# Patient Record
Sex: Male | Born: 1950 | Race: White | Hispanic: No | Marital: Married | Smoking: Current every day smoker
Health system: Southern US, Community
[De-identification: ages and names within clinical notes are randomized; demographics above are authoritative.]

## PROBLEM LIST (undated history)

## (undated) DIAGNOSIS — Z87448 Personal history of other diseases of urinary system: Secondary | ICD-10-CM

## (undated) DIAGNOSIS — I341 Nonrheumatic mitral (valve) prolapse: Secondary | ICD-10-CM

## (undated) DIAGNOSIS — N400 Enlarged prostate without lower urinary tract symptoms: Secondary | ICD-10-CM

## (undated) DIAGNOSIS — N189 Chronic kidney disease, unspecified: Secondary | ICD-10-CM

## (undated) DIAGNOSIS — E785 Hyperlipidemia, unspecified: Secondary | ICD-10-CM

## (undated) DIAGNOSIS — C61 Malignant neoplasm of prostate: Secondary | ICD-10-CM

## (undated) DIAGNOSIS — D649 Anemia, unspecified: Secondary | ICD-10-CM

## (undated) HISTORY — DX: Anemia, unspecified: D64.9

## (undated) HISTORY — DX: Benign prostatic hyperplasia without lower urinary tract symptoms: N40.0

## (undated) HISTORY — PX: TRANSURETHRAL RESECTION OF PROSTATE: SHX73

## (undated) HISTORY — PX: VASECTOMY: SHX75

## (undated) HISTORY — DX: Chronic kidney disease, unspecified: N18.9

## (undated) HISTORY — PX: TONSILLECTOMY: SUR1361

## (undated) HISTORY — DX: Nonrheumatic mitral (valve) prolapse: I34.1

## (undated) HISTORY — DX: Hyperlipidemia, unspecified: E78.5

## (undated) HISTORY — DX: Personal history of other diseases of urinary system: Z87.448

## (undated) HISTORY — PX: INGUINAL HERNIA REPAIR: SUR1180

---

## 2004-10-29 HISTORY — PX: NEPHROSTOMY: SHX1014

## 2005-05-29 ENCOUNTER — Ambulatory Visit (HOSPITAL_COMMUNITY): Admission: RE | Admit: 2005-05-29 | Discharge: 2005-05-29 | Payer: Self-pay | Admitting: Family Medicine

## 2005-06-04 ENCOUNTER — Inpatient Hospital Stay (HOSPITAL_COMMUNITY): Admission: AD | Admit: 2005-06-04 | Discharge: 2005-06-08 | Payer: Self-pay | Admitting: Urology

## 2005-06-18 ENCOUNTER — Ambulatory Visit (HOSPITAL_COMMUNITY): Admission: RE | Admit: 2005-06-18 | Discharge: 2005-06-18 | Payer: Self-pay | Admitting: Urology

## 2005-07-03 ENCOUNTER — Inpatient Hospital Stay (HOSPITAL_COMMUNITY): Admission: RE | Admit: 2005-07-03 | Discharge: 2005-07-08 | Payer: Self-pay | Admitting: Urology

## 2005-07-03 ENCOUNTER — Ambulatory Visit (HOSPITAL_COMMUNITY): Admission: RE | Admit: 2005-07-03 | Discharge: 2005-07-03 | Payer: Self-pay | Admitting: Urology

## 2005-07-10 ENCOUNTER — Ambulatory Visit (HOSPITAL_COMMUNITY): Admission: RE | Admit: 2005-07-10 | Discharge: 2005-07-10 | Payer: Self-pay | Admitting: Urology

## 2005-07-25 ENCOUNTER — Ambulatory Visit (HOSPITAL_COMMUNITY): Admission: RE | Admit: 2005-07-25 | Discharge: 2005-07-25 | Payer: Self-pay | Admitting: Urology

## 2005-08-02 ENCOUNTER — Encounter (INDEPENDENT_AMBULATORY_CARE_PROVIDER_SITE_OTHER): Payer: Self-pay | Admitting: Urology

## 2005-08-02 ENCOUNTER — Inpatient Hospital Stay (HOSPITAL_COMMUNITY): Admission: RE | Admit: 2005-08-02 | Discharge: 2005-08-05 | Payer: Self-pay | Admitting: Ophthalmology

## 2005-08-23 ENCOUNTER — Ambulatory Visit (HOSPITAL_COMMUNITY): Admission: RE | Admit: 2005-08-23 | Discharge: 2005-08-23 | Payer: Self-pay | Admitting: Internal Medicine

## 2005-08-25 ENCOUNTER — Emergency Department (HOSPITAL_COMMUNITY): Admission: EM | Admit: 2005-08-25 | Discharge: 2005-08-25 | Payer: Self-pay | Admitting: Emergency Medicine

## 2005-10-01 ENCOUNTER — Ambulatory Visit (HOSPITAL_COMMUNITY): Admission: RE | Admit: 2005-10-01 | Discharge: 2005-10-01 | Payer: Self-pay | Admitting: Family Medicine

## 2005-10-18 ENCOUNTER — Ambulatory Visit (HOSPITAL_COMMUNITY): Admission: RE | Admit: 2005-10-18 | Discharge: 2005-10-18 | Payer: Self-pay | Admitting: Urology

## 2005-11-05 ENCOUNTER — Ambulatory Visit (HOSPITAL_COMMUNITY): Admission: RE | Admit: 2005-11-05 | Discharge: 2005-11-05 | Payer: Self-pay | Admitting: Urology

## 2006-01-31 ENCOUNTER — Ambulatory Visit (HOSPITAL_COMMUNITY): Admission: RE | Admit: 2006-01-31 | Discharge: 2006-01-31 | Payer: Self-pay | Admitting: Urology

## 2006-02-11 ENCOUNTER — Ambulatory Visit (HOSPITAL_COMMUNITY): Admission: RE | Admit: 2006-02-11 | Discharge: 2006-02-11 | Payer: Self-pay | Admitting: Urology

## 2006-03-01 ENCOUNTER — Encounter (HOSPITAL_COMMUNITY): Admission: RE | Admit: 2006-03-01 | Discharge: 2006-03-31 | Payer: Self-pay | Admitting: Urology

## 2007-12-05 ENCOUNTER — Ambulatory Visit (HOSPITAL_COMMUNITY): Admission: RE | Admit: 2007-12-05 | Discharge: 2007-12-05 | Payer: Self-pay | Admitting: Family Medicine

## 2008-05-14 ENCOUNTER — Encounter: Payer: Self-pay | Admitting: Internal Medicine

## 2008-05-14 ENCOUNTER — Ambulatory Visit (HOSPITAL_COMMUNITY): Admission: RE | Admit: 2008-05-14 | Discharge: 2008-05-14 | Payer: Self-pay | Admitting: Internal Medicine

## 2008-05-14 ENCOUNTER — Ambulatory Visit: Payer: Self-pay | Admitting: Internal Medicine

## 2008-06-13 ENCOUNTER — Emergency Department (HOSPITAL_COMMUNITY): Admission: EM | Admit: 2008-06-13 | Discharge: 2008-06-13 | Payer: Self-pay | Admitting: Emergency Medicine

## 2008-06-13 ENCOUNTER — Encounter: Payer: Self-pay | Admitting: Orthopedic Surgery

## 2008-06-14 ENCOUNTER — Ambulatory Visit: Payer: Self-pay | Admitting: Orthopedic Surgery

## 2008-06-14 DIAGNOSIS — S92309A Fracture of unspecified metatarsal bone(s), unspecified foot, initial encounter for closed fracture: Secondary | ICD-10-CM | POA: Insufficient documentation

## 2008-07-26 ENCOUNTER — Ambulatory Visit: Payer: Self-pay | Admitting: Orthopedic Surgery

## 2008-09-25 ENCOUNTER — Emergency Department (HOSPITAL_COMMUNITY): Admission: EM | Admit: 2008-09-25 | Discharge: 2008-09-25 | Payer: Self-pay | Admitting: Emergency Medicine

## 2010-09-14 ENCOUNTER — Ambulatory Visit (HOSPITAL_COMMUNITY): Admission: RE | Admit: 2010-09-14 | Discharge: 2010-09-14 | Payer: Self-pay | Admitting: Family Medicine

## 2010-11-16 ENCOUNTER — Ambulatory Visit (HOSPITAL_COMMUNITY)
Admission: RE | Admit: 2010-11-16 | Discharge: 2010-11-16 | Payer: Self-pay | Source: Home / Self Care | Attending: Family Medicine | Admitting: Family Medicine

## 2011-03-13 NOTE — Op Note (Signed)
NAME:  Keith Rivera, GUADAGNINO              ACCOUNT NO.:  [id number]   MEDICAL RECORD NO.:  [id number]          PATIENT TYPE:  AMB   LOCATION:  DAY                           FACILITY:  [facility]   PHYSICIAN:  R. Garfield Cornea, M.D. DATE OF BIRTH:  04-14-1951   DATE OF PROCEDURE:  05/14/2008  DATE OF DISCHARGE:                               OPERATIVE REPORT   INDICATIONS FOR PROCEDURE:  This is a 60 year old gentleman with no  lower GI tract symptoms, sent over at courtesy of Dr. Marjean Donna for  colorectal cancer screening.  He has never had his lower GI tract  imaged.  Family history is significant only for a maternal grandfather  with colon cancer diagnosed in his 55s.  Colonoscopy is now being done  as a screening maneuver.  Risks, benefits, alternatives, and limitations  have been reviewed.  Questions answered.  All parties agreeable.  Please  see the documentation in the medical record.   PROCEDURE NOTE:  O2 saturation, blood pressure, pulse, and respirations  were monitored throughout the entire procedure.   CONSCIOUS SEDATION:  Versed 5 mg IV and Demerol 75 mg IV in divided  doses.   INSTRUMENT:  Pentax video chip system     .   FINDINGS:  Digital rectal exam revealed no abnormalities.   ENDOSCOPIC FINDINGS:  Prep was adequate; however, there was a greasy  residue in the ascending colon, which got on the lens of the scope and  made the exam more difficult in this segment (see below).  Colonic  mucosa was surveyed from the rectosigmoid junction through the left  transverse, right colon, appendiceal orifice, ileocecal valve, and  cecum.  These structures were well seen and photographed for the record.  From this level, the scope was slowly withdrawn.  All previously  mentioned mucosal surfaces were again seen.  The patient had sigmoid  diverticula and a diminutive polyp in the base of the cecum, which was  cold biopsied/removed.  The remainder of the colonic mucosa appeared  normal.   There was a greasy residue in the right colon, which got on the  lens surface, it was difficult to wash off or rub off.  This did make  the exam more difficult, but actually, the colonic mucosa was adequately  seen.  Scope was pulled down in the rectum where a thorough examination  of rectal mucosa including retroflex view of the anal verge demonstrated  only a single anal papilla.  The patient tolerated the procedure well  and was reactive in endoscopy.   IMPRESSION:  Anal papilla, otherwise normal rectum, sigmoid diverticula,  diminutive polyp in cecum, status post cold biopsy.  Remainder of the  colonic mucosa appeared normal.   RECOMMENDATIONS:  1. Diverticulosis literature provided to Mr. Roblero.  2. Follow up on path.  3. Further recommendations to follow.       Bridgette Habermann, M.D.  Electronically Signed     RMR/MEDQ  D:  05/14/2008  T:  05/14/2008  Job:  [id number]   cc:   Angus G. Everette Rank, MD  Fax: [phone]

## 2011-03-16 NOTE — Discharge Summary (Signed)
NAMEJAEVIAN, Keith Rivera              ACCOUNT NO.:  [id number]   MEDICAL RECORD NO.:  [id number]          PATIENT TYPE:  INP   LOCATION:  A331                          FACILITY:  [facility]   PHYSICIAN:  Miguel Dibble, M.D.   DATE OF BIRTH:  January 16, 1951   DATE OF ADMISSION:  07/03/2005  DATE OF DISCHARGE:  09/10/2006LH                                 DISCHARGE SUMMARY   CONSULTING PHYSICIAN:  Dr. Everette Rank, Dr. Lowanda Foster.   OPERATIVE PROCEDURE:  Cystoscope, bilateral retrograde pyelograms. I  attempted right ureteral stent placement, done on July 03, 2005.  Bilateral percutaneous nephrostomy done by interventional radiologist on  July 03, 2005.   COMPLICATIONS:  None.   FINAL DIAGNOSES:  1.  Chronic urinary retention.  2.  Benign prostatic hypertrophy.  3.  Bladder neck obstruction.  4.  Bilateral hydronephrosis.  5.  Chronic renal failure.  6.  Urinary tract infection.   OTHER DIAGNOSIS:  Hypercholesterolemia.   This 60 year old male was referred to me by Dr. Everette Rank. He was having  partially obstructive symptoms for several years. He was treated with Hytrin  with good results. Urinary frequency x4 to 5 and nocturia x0. He has good  urinary stream most of the time. Noticed occasional slowing of the urinary  stream. During a complete physical examination by Dr. Everette Rank, he was found  to have distended bladder. The patient was found to be in chronic urinary  retention, and he was admitted to the hospital. Foley catheter was inserted,  and he had a large amount of post void residual urine. The patient developed  postobstructive diuresis which has cleared after several days. Abdominal  ultrasound revealed massive bilateral hydronephrosis and distended bladder.  The patient's renal failure improved slightly with catheter drainage. He has  been on catheter drainage for a few weeks.   Renal function revealed chronic renal failure with a creatinine of 3.9.  Renal ultrasound revealed  minimal bilateral hydronephrosis. The patient was  brought to the short-stay center on July 03, 2005 for cystoscopy,  bilateral retrograde pyelograms, possible ureteral stent placement, as well  as transurethral resection of the prostate. He had been treated with  appropriate antibiotics for urinary tract infection.   PAST MEDICAL HISTORY:  1.  History of hypercholesterolemia.  2.  BPH with bladder neck obstruction.  3.  Chronic urinary retention.  4.  Chronic renal failure.  5.  Status post bilateral vasectomies.  6.  Status post left inguinal hernia repair.   MEDICATIONS:  1.  Hytrin 5 mg 1 p.o. nightly.  2.  Lipitor 10 mg 1 p.o. daily.   ALLERGIES:  None.   FAMILY HISTORY:  Positive for colon cancer.   PHYSICAL EXAMINATION:  HEENT:  Normal.  NECK:  No masses.  LUNGS:  Clear to auscultation.  HEART:  Regular rate and rhythm. No murmurs.  ABDOMEN:  Soft. No palpable flank masses or CVA tenderness. Bladder is not  palpable. No suprapubic tenderness.  GENITOURINARY:  Foley catheter is draining clear urine. His penis and testes  are normal.  RECTAL:  60-g size benign prostate.   COURSE IN THE  HOSPITAL:  The patient was taken to the OR on July 10, 2005. Cystoscopy revealed BPH with bladder neck obstruction. Bilateral  retrograde pyelograms revealed dilated tortuous ureters on both sides. There  was also moderate bilateral hydronephrosis. Due to the tortuosity of the  ureters, I was unable to insert a guidewire and stent into the right  collecting system. Stent placement on the left side was not attempted. The  patient was referred to interventional radiology in the postoperative  period, and bilateral percutaneous nephrostomies were done at [facility]. The left nephrostomy was draining well. There was minimal urine  output from the right nephrostomy. The patient's renal function did not  improve much with the percutaneous nephrostomies. His BUN was 35,  creatinine  3.1. He spiked a fever of 100.3 degrees Fahrenheit after the nephrostomy  placement. Urine culture and sensitivity was done, and the patient was  started on Keflex 500 mg p.o. b.i.d. Dr. Everette Rank was consulted for  management of his medical problems. On July 05, 2005, both nephrostomies  were found to be draining well. The patient had a BUN of 31 and creatinine  of 3.3 on that day. The patient spiked a fever up to 101 degrees Fahrenheit  on July 06, 2005. Blood culture and sensitivity were done. The patient  was continued on p.o. Keflex. Urine culture and sensitivity revealed no  growth. Had good urine output. BUN was 36 and creatinine was 3.6. Nephrology  was consulted for worsening renal function. The patient continued to have  minimal urine output on the right nephrostomy, and he had good urine output  through the left nephrostomy. His BUN and creatinine was stable at 331 and  3.5. Hemoglobin was 12.1 and hematocrit was 35.6. He was continued on IV  fluids. On July 08, 2005, the patient had good urine output. Abdomen  was soft. Renal function was stable. BUN 30, creatinine 3.3. The patient  wanted to go home. He was discharged and sent home with Foley catheter  drainage and bilateral percutaneous nephrostomies.   DISCHARGE MEDICATIONS:  1.  Keflex 500 mg 1 p.o. b.i.d. for 5 days.  2.  Percocet 5/325 one p.o. nightly p.r.n. pain #20.   DISCHARGE INSTRUCTIONS:  Bilateral antegrade nephrostograms will be done as  an outpatient, and antegrade stent placement will be done as an outpatient.  The patient was given Keflex 500 mg 1 p.o. b.i.d. for 5 days. He will  return to the office on July 10, 2005 for followup. He was advised to  call me for fever, chills, hematuria, or blocked catheters.   CONDITION ON DISCHARGE:  Stable.      Miguel Dibble, M.D.  Electronically Signed    SK/MEDQ  D:  08/16/2005  T:  08/16/2005  Job:  [id number]

## 2011-03-16 NOTE — H&P (Signed)
NAMECLEVER, SHOR              ACCOUNT NO.:  [id number]   MEDICAL RECORD NO.:  [id number]          PATIENT TYPE:  AMB   LOCATION:  DAY                           FACILITY:  [facility]   PHYSICIAN:  Miguel Dibble, M.D.   DATE OF BIRTH:  July 03, 1951   DATE OF ADMISSION:  11/05/2005  DATE OF DISCHARGE:  LH                                HISTORY & PHYSICAL   CHIEF COMPLAINT:  1.  Bilateral hydronephrosis.  2.  Chronic urinary retention.  3.  Benign prostatic hypertrophy, post transurethral resection of the      prostate.   HISTORY OF PRESENT ILLNESS:  This 60 year old male is referred to me by Dr.  Everette Rank a few months ago.  He was found to be in chronic urinary retention.  He had bilateral hydronephrosis, chronic urinary retention, BPH, and bladder  neck obstruction.  He has undergone bilateral percutaneous ureteral stent  placement after the transurethral resection of the prostate.  He had been  voiding well at present.  He is emptying the bladder well.  He did not have  fever, chills, voiding difficulty or gross hematuria.  He was recently  treated for a urinary tract infection.  There was improvement of the  bilateral hydronephrosis.  The patient is brought to the short-stay center  today for cystoscopy, removal of the bilateral ureteral stents, bilateral  retrograde pyelograms with possible bilateral ureteral stent replacement.   PAST MEDICAL HISTORY:  History of elevated cholesterol.  Chronic urinary  retention.  Chronic renal failure.  Bilateral hydronephrosis.  Status post  bilateral ureteral stent placement.  Status post TURP.   CURRENT MEDICATIONS:  Lipitor 10 mg one p.o. daily.   ALLERGIES:  None.   PHYSICAL EXAMINATION:  HEENT:  Normal.  NECK:  No masses.  LUNGS:  Clear to auscultation.  HEART:  Regular rate and rhythm, no murmurs.  ABDOMEN:  Soft, no palpable flank mass or costovertebral angle tenderness.  Bladder is not palpable.  GENITALIA:  Penis and testes are  normal.   IMPRESSION:  Bilateral hydronephrosis, chronic urinary retention, benign  prostatic hypertrophy with bilateral obstruction, post transurethral  resection of the prostate.   PLAN:  Cystoscopy, removal of bilateral ureteral stents, bilateral  retrograde pyelograms, possible replacement of the ureteral stents under  anesthesia in the short-stay center.  I have discussed with the patient  regarding the diagnosis, operative details, alternate treatments, outcome,  possible risks and complications, and he has agreed for the procedure to be  done.      Miguel Dibble, M.D.  Electronically Signed     SK/MEDQ  D:  11/05/2005  T:  11/05/2005  Job:  [id number]   cc:   Angus G. Everette Rank, MD  Fax: [phone]

## 2011-03-16 NOTE — Op Note (Signed)
[name], Keith Rivera              ACCOUNT NO.:  [id number]   MEDICAL RECORD NO.:  [id number]          PATIENT TYPE:  AMB   LOCATION:  DAY                           FACILITY:  [facility]   PHYSICIAN:  Miguel Dibble, M.D.   DATE OF BIRTH:  1951/01/24   DATE OF PROCEDURE:  02/11/2006  DATE OF DISCHARGE:                                 OPERATIVE REPORT   PREOPERATIVE DIAGNOSIS:  Bilateral hydronephrosis, status post bilateral  ureteral stent placement, chronic renal failure.   POSTOPERATIVE DIAGNOSIS:  Bilateral hydronephrosis, status post bilateral  ureteral stent placement, chronic renal failure.   OPERATIVE PROCEDURE:  Cystoscopy and removal of bilateral ureteral stents.   ANESTHESIA:  Spinal.   SURGEON:  Miguel Dibble, M.D.   COMPLICATIONS:  None.   ESTIMATED BLOOD LOSS:  None.   DRAINS:  None.   SPECIMEN:  Ureteral stents x2 which were given to the patient.  Complications none.   INDICATIONS FOR PROCEDURE:  This 59 year old male had chronic urinary  retention associated with chronic renal failure and bilateral  hydronephrosis.  He has undergone bilateral ureteral stent placement and  TURP.  He is doing well at present. He was brought to the OR today for  cystoscopy, removal of bilateral ureteral stents.   DESCRIPTION OF PROCEDURE:  Spinal anesthesia was induced and the patient was  placed on the OR table in the dorsolithotomy position.  The lower abdomen  and genitalia were prepped and draped in a sterile fashion.  Cystoscopy was  done with a 25-French scope.  The urethra was normal.  With evidence of  previous TURP and the  bladder neck was opened.  The appearance of the bladder was unremarkable.  The lower end of the right ureteral stent was then held with a grasping  forceps and removed without difficulty.  The left ureteral stent was removed  in a similar fashion.  Instruments were removed.  The patient was  transferred to the PACU in a satisfactory  condition.      Miguel Dibble, M.D.  Electronically Signed     SK/MEDQ  D:  02/11/2006  T:  02/11/2006  Job:  [id number]   cc:   Angus G. Everette Rank, MD  Fax: [phone]

## 2011-03-16 NOTE — Consult Note (Signed)
NAMEITSUO, PACIFICO              ACCOUNT NO.:  [id number]   MEDICAL RECORD NO.:  [id number]          PATIENT TYPE:  INP   LOCATION:  A331                          FACILITY:  [facility]   PHYSICIAN:  Alison Murray, M.D.DATE OF BIRTH:  September 10, 1951   DATE OF CONSULTATION:  07/06/2005  DATE OF DISCHARGE:                                   CONSULTATION   NEPHROLOGY CONSULTATION:   DATE OF CONSULTATION:  July 06, 2005   REASON FOR CONSULTATION:  Worsening of renal failure.   REQUESTING PHYSICIAN:  Dr. Miguel Dibble   HISTORY OF PRESENT ILLNESS:  Mr. Majewski is a 60 year old Caucasian male  who was recently seen by me when he was in the hospital for acute renal  failure thought to be secondary to BPH, obstructive uropathy, status post  stent placement presently came because of recurrent bilateral hydro.  Mr.  Fithen was seen on June 04, 2005.  During that time he was found to have  obstructive uropathy with creatinine of 4.9.  Patient has a history of BPH.  Foley catheter was placed.  Patient was hydrated, renal function  progressively improved to about a creatinine of 3.6, discharged home and his  creatinine went down to 3.1.  However, during a followup, patient was found  to have still bilateral hydro hence admitted by Dr. Maryland Pink for placement  of ureteral stent.  However, because of difficulty in putting in the stent,  the patient was sent to [facility] where he had bilateral nephrostomy tube  placement.  Presently patient complains of some fever, chills, sweating.  He  denies any nausea, no vomiting, appetite is reasonable, he denies also any  shortness of breath.   PAST MEDICAL HISTORY:  He has history of long-standing BPH, history of renal  failure thought to be secondary to obstructive uropathy, history of  bilateral hydronephrosis status post nephrostomy placement, history of  hypercholesterolemia, history of metabolic acidosis.   PAST SURGICAL HISTORY:  He is  status post bilateral vasectomy and history of  left inguinal repair and bilateral nephrostomy placement.   MEDICATIONS:  His medication at this moment consists of Keflex 500 mg p.o.  b.i.d.  He is also getting some pain medication.  He is on p.r.n. Tylox  also.   ALLERGIES:  No known allergies.   SOCIAL HISTORY:  No history of smoking and no history of alcohol abuse.  Patient lives at home with his wife.   REVIEW OF SYSTEMS:  He complains of some fever, some sweating, no chills and  no nausea, no vomiting, appetite is reasonable.  He denies any shortness of  breath.  At this moment patient does not offer any other complaints.   EXAMINATION:  VITAL SIGNS:  His temperature is 97.9, T-max was 101.3 today,  blood pressure 93/54, pulse of 77, respiratory rate is 20.  HEENT EXAM:  No conjunctival pallor.  Nonicteric.  Oral mucosa seems to be  dry.  NECK:  Supple.  No JVD.  CHEST:  Clear to auscultation.  No rales, no rhonchi, no egophony.  His  heart exam reveals regular rate and rhythm.  No murmur, no S3.  ABDOMEN:  Soft, positive bowel sounds, nontender.  He has bilateral  nephrostomy tube placed on the back.  EXTREMITIES:  No edema.   LABORATORIES:  His 24-hour input is 200, output of 2000.  Blood work:  His  white blood cell count on July 04, 2005 was 13, hemoglobin 11.7,  hematocrit 35.9, platelets of 550.  Sodium today is 132, BUN 36, creatinine  3.6.  On July 04, 2005, BUN 35, creatinine 3.1.  At this moment no new  blood work.   ASSESSMENT:  Problem 1. RENAL INSUFFICIENCY.  At this moment probably acute  on chronic.  The etiology for his renal insufficiency seems to be secondary  to obstructive uropathy presently in patient with reasonable urine output  however creatinine seems to be going up.  Probably patient might have  superimposed prerenal syndrome, however, acute tubular necrosis and  __________ nephritis superimposed on chronic renal failure cannot be ruled   out.  Patient does not have any sign and symptom of congestive heart  failure.   Problem 2. ANEMIA.  Mild.  Probably may be related to his renal failure.  However, iron-deficiency anemia cannot be ruled out.   Problem 3. FEVER.  He is on antibiotics with leukocytosis probably secondary  to pyelonephritis.   Problem 4. HYPERCHOLESTEROLEMIA.   Problem 5. HISTORY OF BENIGN PROSTATIC HYPERTROPHY.  Still has Foley  catheter in.   RECOMMENDATIONS:  We will start hydrating the patient and we will put him on  normal saline at 125 mL/hr.  We will check his UA.  We will follow his BMET  and if his H&H continues to drop probably will do iron studies.  We will  continue the other medication and we will follow patient.      Alison Murray, M.D.  Electronically Signed     BB/MEDQ  D:  07/06/2005  T:  07/06/2005  Job:  [id number]

## 2011-03-16 NOTE — Group Therapy Note (Signed)
NAMEGRIEZMANN, Keith              ACCOUNT NO.:  [id number]   MEDICAL RECORD NO.:  [id number]          PATIENT TYPE:  INP   LOCATION:  A312                          FACILITY:  [facility]   PHYSICIAN:  Edward L. Luan Pulling, M.D.DATE OF BIRTH:  Mar 22, 1951   DATE OF PROCEDURE:  08/04/2005  DATE OF DISCHARGE:                                   PROGRESS NOTE   PRIMARY CARE PHYSICIANS:  1.  Dr. Maryland Pink.  2.  Angus G. Everette Rank, MD   SUBJECTIVE:  Keith Rivera says he is feeling well.  Dr. Maryland Pink has seen him  and has ordered for his Foley catheter to be removed.  He did have some  temperature.   PHYSICAL EXAMINATION:  VITAL SIGNS:  T-max 100.1, now 99.6.  Pulse is 94,  respirations 20, blood pressure 103/66, O2 saturation is 96%.  He looks much  more comfortable than before.   ASSESSMENT:  He is seems to be doing okay postoperatively.   PLAN:  The plan is to continue with his treatments and medications as  before.  No other new medications at this point.      Edward L. Luan Pulling, M.D.  Electronically Signed     ELH/MEDQ  D:  08/04/2005  T:  08/04/2005  Job:  [id number]

## 2011-03-16 NOTE — Consult Note (Signed)
NAMECORKY, Keith Rivera              ACCOUNT NO.:  [id number]   MEDICAL RECORD NO.:  [id number]          PATIENT TYPE:  INP   LOCATION:  A312                          FACILITY:  [facility]   PHYSICIAN:  Alison Murray, M.D.DATE OF BIRTH:  05/24/1951   DATE OF CONSULTATION:  DATE OF DISCHARGE:                                   CONSULTATION   REASON FOR CONSULTATION:  Renal insufficiency.   HISTORY OF PRESENT ILLNESS:  Mr. Royal is a 60 year old gentleman who has  past medical history of BPH, presently went to Dr. Everette Rank for physical and  was found to have a distended bladder.  Hence, because of history of BPH,  the patient presented for ultrasound where he was found to have  hydronephrosis.  Presently, Dr. Maryland Pink was called, and Foley catheter was  inserted with significant urine output.  Since the patient has elevated BUN  and creatinine.  He __________ .  The patient denies any fever, chills or  sweating.  No nausea or vomiting.  No __________ .  The patient sits up and  was feeling okay.  He did not have any problems.   PAST MEDICAL HISTORY:  As stated above.  1.  History of BPH as he was on Hytrin.  2.  History of hypercholesterolemia.   PAST SURGICAL HISTORY:  1.  History of bilateral vasectomy.  2.  History of left inguinal hernia repair.   MEDICATIONS:  1.  Normal saline 75 cc per hour.  2.  Nicotine patch once daily.  3.  Zocor 20 mg p.o. daily.  4.  Hytrin 5 mg p.o. daily.   ALLERGIES:  No known drug allergies.   SOCIAL HISTORY:  No history of drug abuse.  No history of alcohol.   FAMILY HISTORY:  No history of renal failure.   REVIEW OF SYSTEMS:  At this moment, the patient has no complaints.  No  nausea or vomiting.  __________ .  Denies any shortness of breath.  No  urgency or frequency.  History is that she was going to the bathroom  frequently on regular basis.  Also, he denies any swelling of his legs.   PHYSICAL EXAMINATION:  GENERAL APPEARANCE:  Alert.   No apparent distress.  VITAL SIGNS:  Blood pressure 115/74, temperature 98.1, pulse 67, respiratory  rate 16.  HEENT:  No conjunctival pallor.  NECK:  __________  JVD.  CHEST:  Clear to auscultation.  No rales, rhonchi, no __________ .  CARDIOVASCULAR:  Regular rate and rhythm.  No murmur.  ABDOMEN:  Soft.  Positive bowel sounds.  EXTREMITIES:  __________ edema.   LABORATORY DATA:  He has an input of 50-100 after the insertion of his Foley  catheter.  White blood cell count 6.8, hemoglobin 12.9, hematocrit 37.8,  sodium 158, 3.9, CO2 17, BUN 56, creatinine 4.9, calcium 8.6, specific  gravity of urine 1.010, pH 6, moderate blood, 30 mg/dL of protein, 7-10 red  blood cell count.   ASSESSMENT:  1.  Renal insufficiency.  At this moment, acute.  He has bilateral hydro      __________ , seems to  be __________ .  At this moment, since the patient      does not have any other risk factors, assumption is probably acute renal      failure related to the above.  2.  Proteinuria.  This may be related to his hematuria.  3.  History of benign prostatic hypertrophy.  He has Foley catheter with      good urine output, being followed by Dr. Maryland Pink.  4.  Hypercholesterolemia.  He is on Zocor.   RECOMMENDATIONS:  At this moment, we need to increase his IV fluid of normal  saline to 25 cc per hour.  Since the patient will have obstructive diuresis,  we need to also follow his electrolytes.  Will continue follow up, and if  there is any change in his renal function, will make decision based on that.      Alison Murray, M.D.  Electronically Signed     BB/MEDQ  D:  06/04/2005  T:  06/05/2005  Job:  [id number]

## 2011-03-16 NOTE — Op Note (Signed)
NAMEJAMARCO, SCHRUPP              ACCOUNT NO.:  [id number]   MEDICAL RECORD NO.:  [id number]          PATIENT TYPE:  AMB   LOCATION:  DAY                           FACILITY:  [facility]   PHYSICIAN:  Miguel Dibble, M.D.   DATE OF BIRTH:  1951/06/23   DATE OF PROCEDURE:  07/03/2005  DATE OF DISCHARGE:                                 OPERATIVE REPORT   PREOPERATIVE DIAGNOSIS:  Chronic renal failure, chronic urinary retention,  benign prostatic hypertrophy with bladder neck obstruction, bilateral  hydronephrosis.   POSTOPERATIVE DIAGNOSIS:  Chronic renal failure, chronic urinary retention,  benign prostatic hypertrophy with bladder neck obstruction, bilateral  hydronephrosis.   OPERATIVE PROCEDURE:  Cystoscopy, bilateral retrograde pyelograms and  attempted right ureteral stent placement.   ANESTHESIA:  Spinal.   SURGEON:  Dr. Maryland Pink.   COMPLICATIONS:  None.   ESTIMATED BLOOD LOSS:  Minimal.   DRAINS:  18-French Foley catheter in the bladder.   INDICATIONS FOR PROCEDURE:  This 60 year old male was found to be in chronic  urinary retention with acute on chronic renal failure. X-rays revealed  massive bilateral hydronephrosis. The patient was on catheter drainage for a  few weeks. He had significant post obstructive diuresis which resolved. He  still had chronic renal failure. Renal ultrasound revealed persistent  bilateral hydronephrosis. The patient has been on Foley catheter drainage.  He was taken to the OR today for cystoscopy, bilateral retrograde  pyelograms, possible bilateral ureteral stent placement, and possible TUR of  the prostate.   DESCRIPTION OF PROCEDURE:  Spinal anesthesia was induced, and the patient  was placed on the OR table in the dorsolithotomy position. The indwelling  Foley catheter was removed. The lower abdomen and genitalia were prepped and  draped in a sterile fashion. Cystoscopy was done with the 24-French scope.  The urethra was normal. There was  moderate hypertrophy involving both  lateral lobes of prostate and bladder neck obstruction. Bladder was examined  carefully. There was no abnormality inside the bladder. Both ureteral  orifices were laterally placed. The 5-French wedge catheter was then placed  in the right ureteral orifice. Retrograde pyelogram was done by using about  15 cc of Renografin 60. There was tortuosity of the lower ureter. There was  fish-hooking of the lower ureter with proximal dilation and hydronephrosis.  The anterior ureter was found to be very much dilated and tortuous. There  was also moderate right hydronephrosis.   The 5-French open-ended catheter was then placed in the right ureteral  orifice. A 0.038-gauge Benson guidewire was then inserted into the right  collecting system. Due to the tortuosity of the lower ureter as well as the  upper ureter, it was difficult to pass the [facility] guidewire. The Bishop  guidewire was then removed. Glidewire was inserted and I was able to get the  Glidewire up to the renal pelvis. I was unable to pass the stent due to the  tortuosity of the ureter. The guidewire and the catheter were then removed.  I did a left retrograde pyelogram which revealed a similar appearance. There  was fish-hooking of the left distal  ureter with tortuosity and dilation of  the entire ureter. There was also moderate left hydronephrosis. In view of  the patient's secondary obstruction of the ureterovesical junction, I  decided not to do the TURP at this time. I plan to send the patient for  bilateral percutaneous nephrostomies by interventional radiologist. The  transurethral resection of the prostate will be scheduled at a later date  when his renal function stabilizes.   The estimated blood loss was minimal. A 18-French Foley catheter was  inserted into the bladder. The patient was then transferred to the PACU in a  satisfactory condition.      Miguel Dibble, M.D.  Electronically  Signed     SK/MEDQ  D:  07/03/2005  T:  07/03/2005  Job:  [id number]   cc:   Angus G. Everette Rank, [place]  [place] [address]  Fax: [phone]   Alison Murray, M.D.  [address]  [place]  [place] [address]  Fax: [phone]

## 2011-03-16 NOTE — Op Note (Signed)
Keith Rivera, Keith Rivera              ACCOUNT NO.:  [id number]   MEDICAL RECORD NO.:  [id number]          PATIENT TYPE:  AMB   LOCATION:  DAY                           FACILITY:  [facility]   PHYSICIAN:  Miguel Dibble, M.D.   DATE OF BIRTH:  05-May-1951   DATE OF PROCEDURE:  08/02/2005  DATE OF DISCHARGE:                                 OPERATIVE REPORT   PREOPERATIVE DIAGNOSES:  1.  Chronic urinary retention.  2.  Benign prostate hypertrophy.  3.  Bladder neck obstruction.  4.  Bilateral hydronephrosis.  5.  Chronic renal failure.   POSTOPERATIVE DIAGNOSES:  1.  Chronic urinary retention.  2.  Benign prostate hypertrophy.  3.  Bladder neck obstruction.  4.  Bilateral hydronephrosis.  5.  Chronic renal failure.   OPERATIVE PROCEDURES:  1.  Cystoscopy.  2.  Left retrograde pyelogram.  3.  Attempted left ureteral stent placement.  4.  Transverse resection of prostate.   ANESTHESIA:  Spinal.   SURGEON:  Miguel Dibble, M.D.   COMPLICATIONS:  None.   DRAINS:  A 22-French triple lumen Foley catheter with 30 mL balloon in the  bladder.   ESTIMATED BLOOD LOSS:  100 mL.   SPECIMEN:  Prostate chips were which was sent to pathology.   INDICATIONS FOR PROCEDURE:  This 60 year old male had chronic urinary  retention. He had bilateral hydronephrosis and chronic renal failure. He has  undergone right ureteral stent placement and left percutaneous nephrostomy.  He is on catheter drainage at present. He was brought to the OR today for  cystoscopy, left retrograde pyelogram, left ureteral stent placement and  transverse resection of prostate.   DESCRIPTION OF PROCEDURE:  Spinal anesthesia was induced and the patient was  placed on the OR table in the dorsal lithotomy position. The indwelling  Foley catheter was removed. The lower abdomen and genitalia were prepped and  draped in a sterile fashion. Cystoscopy was done with a 25-French scope. The  urethra was normal. There was moderate  hypertrophy involving both lateral  lobes of the prostate and bladder neck obstruction. The bladder was then  examined. The right ureteral stent was noted. The trigone, ureteral orifices  and bladder mucosa were normal. The 5-French urethral catheter was then  placed a left ureteral orifice and retrograde pyelogram was done by using 10  mL of Renografin 60. There was fish hooking of the left distal ureter with  moderate hydroureter and hydronephrosis. The ureter was found to be very  much dilated with tortuosity.   A 5-French open-ended catheter was then placed in the left distal ureter. A  0.138 inch Bentson guide with the flexible tip was then advanced in the left  renal pelvis.  It was difficult to insert the guidewire into the renal  pelvis. The open-ended catheter was then removed. I tried to place a 28 cm  size stent over the guidewire. Due to the tortuosity of the ureter, the  stent could not be inserted into the renal pelvis. The stent was not long  enough to be inserted into the renal pelvis. The stent and guidewire were  then removed.   Ureter was then dilated up to 30-French with Leander Rams sounds. A 28 F  resectoscope with continuous bladder irrigation was then inserted into the  bladder. Obstructing adenoma between the bladder neck and verumontanum was  resected first. The right and left lateral lobes were then dissected up to  the level of capsule. Finally, dissection was done in the anterior midline  area. The prostate chips were removed and sent for histopathological  examination. The estimated blood loss was about 100 mL. The prostate fossa  was then closely examined and complete hemostasis was obtained by  cauterization. A 22 French triple lumen Foley catheter with 30 mL balloon  was inserted into the bladder. Continuous bladder irrigation was started and  the returns were clear. The patient was transferred to the PACU in a  satisfactory condition.      Miguel Dibble, M.D.  Electronically Signed     SK/MEDQ  D:  08/02/2005  T:  08/02/2005  Job:  [id number]   cc:   Angus G. Everette Rank, MD  Fax: [phone]

## 2011-03-16 NOTE — Consult Note (Signed)
NAMEBALEN, PINAULT              ACCOUNT NO.:  [id number]   MEDICAL RECORD NO.:  [id number]          PATIENT TYPE:  INP   LOCATION:  A312                          FACILITY:  [facility]   PHYSICIAN:  Delphina Cahill, M.D.        DATE OF BIRTH:  17-Sep-1951   DATE OF CONSULTATION:  08/03/2005  DATE OF DISCHARGE:                                   CONSULTATION   PRIMARY CARE PHYSICIAN:  Angus G. Everette Rank, MD.   Noreene LarssonBonnielee Haff, MD   REASON FOR CONSULTATION:  Fever and chronic renal failure.   BRIEF HISTORY:  Mr. Stallman is a 60 year old white male who we were asked  to consult on by Dr. Maryland Pink following recent procedure he performed.  Mr.  Lafrenier had history of distended bladder with bladder outlet obstruction,  significant urinary retention with bilateral hydronephrosis resulting in  likely chronic renal insufficiency/failure.  He has had some BPH as well,  and for this reason, Dr. Maryland Pink has done multiple procedures on him to  correct this.  He just performed a TURP procedure yesterday.  Following this  procedure, Mr. Want had shaking chills and fever of 102.4.   PAST MEDICAL HISTORY:  1.  BPH.  2.  Bladder outlet obstruction.  3.  Chronic urinary retention.  4.  Bilateral hydronephrosis.  5.  Bilateral inguinal hernias status post repair on the left.  6.  Status post bilateral dissection.   MEDICATIONS ON ADMISSION:  1.  Lipitor 10 p.o. daily.  2.  Hytrin 5 mg p.o. q.h.s.  3.  Cipro 250 mg b.i.d.   MEDICATIONS UPON CONSULTATION:  1.  Cipro 250 mg p.o. b.i.d.  2.  Zocor 20 mg p.o. daily.  3.  Tylenol 650 p.o. q.6 h p.r.n.  4.  Morphine 2 mg IV q.3 h p.r.n.  5.  Tylox one cap p.o. q.8 h p.r.n. pain.   ALLERGIES:  No known drug allergies.   REVIEW OF SYSTEMS:  The patient denies any new fever or chills from  yesterday's occurrence.  No significant pain other than some muscle pain in  his upper back and neck since his episode of chills yesterday.  No problems  with bowels.  No other neurologic symptoms.  No chest pain.  No shortness of  breath.   PHYSICAL EXAMINATION:  VITAL SIGNS:  Temperature 98.8 this morning, systolic  blood pressure [id number], pulse 85, respirations 18, 98% on room air.  GENERAL APPEARANCE:  This is a thin, white male in no acute distress.  HEENT:  Pupils equal, round and reactive to light and accommodation.  No  scleral icterus.  Oropharynx is clear.  NECK:  Supple.  Does have some muscle tenderness in his posterior neck, but  no meningismus.  LUNGS:  Clear to auscultation bilaterally.  HEART:  Regular rate and rhythm.  No murmurs, gallops, rubs.  ABDOMEN:  Soft.  No abdominal masses appreciated.  GU:  Per urologist.  No significant abnormalities noted.  BACK:  Lower back has nephrostomy tube with bandage.  No significant  problems noted around bandage.  No other skin findings  apparent.   LABORATORY DATA:  Pertinent since admission:  White count 10.1, hemoglobin  11.1, platelets 245,000.  B-Met showed sodium 133, potassium 4, chloride  104, CO2 22, glucose 101, BUN 33, creatinine 3.2, calcium 8.3, blood  cultures x2 are pending.  No growth to date.  Just drawn yesterday.   IMPRESSION:  A 60 year old white male with significant problems with chronic  urinary retention, benign prostatic hypertrophy with bladder outlet  obstruction, bilateral hydronephrosis and chronic renal failure who was  admitted by Dr. Maryland Pink for TURP procedure.  1.  Status post TURP.  As mentioned above, multiple symptomatology likely      resulting in chronic renal failure.  His creatinine had been stable      since his last hospitalization and not feel there is any other further      need as long as no further hydronephrosis is apparent.  Deferred to Dr.      Maryland Pink for further workup of this and mention of getting a stent due      to tortuosity of the ureter if possible.  2.  Fever, chills likely from TURP procedure.  Had a short burst of  seeding      of his blood, although, nothing in his blood culture at this time may      have caused fever and chills.  He is asymptomatic at this time.  Is not      having any significant complaints.  Is currently on therapy with Cipro      b.i.d.  Dr. Maryland Pink will likely continue this following      hospitalization.  3.  Hyperlipidemia.  The patient is to continue on Zocor as previous      prescribed.  No further workup done at this time.   DISPOSITION:  Per Dr. Lyman Speller note, if the patient is tolerating p.o.'s  and able to have adequate pain control with p.o. medication and not have any  further fever or chills, agree with discharge tomorrow if okay with  urologist.  I am sure Dr. Maryland Pink will follow up with the patient in the  next week or two and will be happy to seem him in our office, either myself  or Dr. Everette Rank.   Thank you for the consult.      Delphina Cahill, M.D.  Electronically Signed     ZH/MEDQ  D:  08/03/2005  T:  08/03/2005  Job:  [id number]

## 2011-03-16 NOTE — H&P (Signed)
Keith Rivera, Keith Rivera              ACCOUNT NO.:  [id number]   MEDICAL RECORD NO.:  [id number]          PATIENT TYPE:  INP   LOCATION:  A312                          FACILITY:  [facility]   PHYSICIAN:  Miguel Dibble, M.D.   DATE OF BIRTH:  [birth year]   DATE OF ADMISSION:  06/04/2005  DATE OF DISCHARGE:  LH                                HISTORY & PHYSICAL   CHIEF COMPLAINT:  Distended bladder, voiding difficulty, enlarged prostate.   HISTORY OF PRESENT ILLNESS:  This 60 year old male was referred to me by Dr.  Everette Rank, and I saw him in the office a few days ago. He has enlarged  prostate with bladder neck obstruction. He has been taking Hytrin 5 mg 1  p.o. q.h.s. with good results. The patient has noticed occasional slowing of  the urinary stream. He has urinary frequency x4 to 5 and nocturia x1. There  is no history of fevers, chills, or voiding difficulty or gross hematuria.  He does not have urinary incontinence.   He underwent a complete physical examination by Dr. Everette Rank recently.  Bladder was found to be distended up to the umbilicus. Renal ultrasound done  at [facility] revealed distended bladder, associated bilateral  massive hydronephrosis. The patient was referred to me, and I saw him in the  office a few days ago. I advised admission to the hospital. The patient  wanted to be admitted on June 04, 2005.   PAST MEDICAL HISTORY:  1.  Benign prostatic hypertrophy with bladder neck obstruction on Hytrin 5      mg 1 p.o. q.d.  2.  Elevated cholesterol.  3.  Status post left inguinal hernia repair.  4.  Status post bilateral vasectomy.   MEDICATIONS:  1.  Lipitor 10 mg 1 p.o. q.d.  2.  Hytrin 5 mg 1 p.o. q.h.s.   ALLERGIES:  None.   FAMILY HISTORY:  Unremarkable.   PHYSICAL EXAMINATION:  HEENT:  Normal.  NECK:  No masses.  LUNGS:  Clear to auscultation.  HEART:  Regular rate and rhythm. No murmurs.  ABDOMEN:  Soft. No palpable flank mass. No CVA tenderness.  Bladder was found  to be distended up to the umbilicus.  Penis normal.  Testes are normal.  Rectal examination 40 gm benign prostate.   A 16-French Foley catheter was inserted after admission. Four hundred cc of  clear urine was drained.   ADMISSION LABORATORY DATA:  BUN is elevated to 33, creatinine 4.9, sodium  3.9, potassium 138. CBC:  WBC 6.8, hemoglobin 12.9, hematocrit 37.8.   IMPRESSION:  Chronic urinary retention, acute on chronic renal failure,  benign prostatic hypertrophy, bladder neck obstruction.   PLAN:  1.  I am going to admit the patient to the hospital.  2.  IV fluids.  3.  Will follow his urine output and electrolytes very closely. He needs to      be observed for post obstructive diuresis and hematuria.  4.  Medical consult with [place].  5.  I have discussed with the patient and his family regarding the      management options.  He will be discharged after post obstructive      diuresis and hematuria are resolved. He will need catheter drainage for      a few weeks after which transurethral resection of the prostate will be      done.       SK/MEDQ  D:  06/04/2005  T:  06/04/2005  Job:  [id number]   cc:   Angus G. Everette Rank, [place]  [place] [address]  Fax: [phone]   Alison Murray, M.D.  [address]  [place]  [place] [address]  Fax: [phone]

## 2011-03-16 NOTE — Discharge Summary (Signed)
Keith Rivera, Keith Rivera              ACCOUNT NO.:  [id number]   MEDICAL RECORD NO.:  [id number]          PATIENT TYPE:  INP   LOCATION:  A312                          FACILITY:  [facility]   PHYSICIAN:  Miguel Dibble, M.D.   DATE OF BIRTH:  05-Jan-1951   DATE OF ADMISSION:  08/02/2005  DATE OF DISCHARGE:  10/08/2006LH                                 DISCHARGE SUMMARY   DISCHARGE DIAGNOSES:  1.  Benign prostatic hypertrophy with bladder neck obstruction.  2.  Chronic urinary retention.  3.  Bilateral hydronephrosis.  4.  Chronic renal failure.   PROCEDURE:  Cystoscopy with left retrograde pyelogram and left ureteral  stent placement and transurethral resection of prostate done on August 02, 2005.   COMPLICATIONS:  None.   HISTORY OF PRESENT ILLNESS:  This 60 year old male is referred to me by Dr.  Everette Rank.  He had prostate obstructive symptoms for several years.  He had  urinary frequency x4, nocturia x1 and good urinary stream.  He noticed  occasional slowing of the urinary stream during a complete physical  examination by Dr. Everette Rank in August 2006.  He was noted to have a distended  bladder.  The patient was admitted to the hospital for further evaluation.  He had significant postobstructive diuresis which resolved slowly.  Renal  ultrasound revealed marked bilateral hydronephrosis.  His BUN and creatinine  were elevated.  Even after catheterization, his BUN and creatinine did not  return to normal levels.  He had chronic renal failure.  Repeat renal  ultrasound revealed persistent bilateral ureteral nephrosis.  The patient  was taken to the operating room and has undergone cystoscopy and bilateral  retrograde pyelogram.  This was found to be tortuous and dilated.  A  catheter could not be passed into the renal pelvis on both sides.  Subsequently, the patient has undergone bilateral percutaneous nephrostomies  by interventional radiologist.  Renal function did not improve the  percutaneous nephrostomy drainage.  The patient also had right antegrade  stent placement with removal of nephrostomy on the right side.  He still had  left percutaneous nephrostomy and Foley catheter.  The patient was brought  to the hospital on August 02, 2005, for cystoscopy, possible left ureteral  stent placement and transurethral resection of prostate.   PAST MEDICAL HISTORY:  1.  BPH with bilateral obstruction.  2.  Chronic urinary retention.  3.  Bilateral hydronephrosis.  4.  Chronic renal failure.  5.  Status post left inguinal hernia repair.  6.  History of elevated cholesterol.   MEDICATIONS:  1.  Lipitor 10 mg one p.o. daily.  2.  Hytrin 5 mg one p.o. nightly.  3.  Cipro 250 mg p.o. b.i.d.   ALLERGIES:  No known drug allergies.   PHYSICAL EXAMINATION:  HEENT:  Normal.  NECK:  No masses.  LUNGS:  Clear to auscultation.  HEART:  Regular rate and rhythm with no murmurs.  ABDOMEN:  Soft, no palpable flank mass.  No costovertebral angle tenderness.  Left percutaneous nephrostomy is draining clear urine.  GENITALIA:  No suprapubic tenderness.  Penis normal.  Foley catheter was  draining clear urine.  Testes normal.  RECTAL:  Benign.  Prostate about 40 g in size.   LABORATORY DATA AND X-RAY FINDINGS:  A CT scan of the abdomen without  contrast revealed small atrophic right kidney.  The left kidney was normal  in size.   Preop CBC with WBC 7.1, hemoglobin 13.5, hematocrit 39.8.  BUN 37,  creatinine 3.3.   HOSPITAL COURSE:  The patient was taken to the OR on August 02, 2005.  Cystoscopy and left ureteral pyelogram was done.  There was small left  hydronephrosis with dilated left ureter.  A guide wire could not be inserted  into the left renal pelvis and stent could not be inserted.  Transurethral  resection of prostate was done.  The patient did well in the postop period.  He was seen by Dr. Nevada Crane for followup of his medical problems.  Please refer  to his consultation  report.  Bladder irrigation was discontinued after 24  hours.  The patient's postop labs were within normal range.  He spiked a  fever up to 102 degrees Fahrenheit in the first postop day.  Blood culture  and sensitivity were done and the patient was continued on IV antibiotics.  The second postop day, he still had a fever up to 101.8 degrees Fahrenheit.  Foley catheter was removed and he was voiding well.  Renal function improved  slightly with BUN 32, creatinine 3.4.  On August 05, 2005, the patient was  voiding without any difficulty and no hematuria.  The left nephrostomy was  draining well.  Examination of the abdomen revealed that the bowel was not  palpable.  He was discharged and sent home on that day.  His blood culture  was negative.   DISCHARGE MEDICATIONS:  1.  Amoxicillin 500 mg one p.o. t.i.d. x7 days.  2.  Percocet 5/325 mg one p.o. q.8h. p.r.n. pain, #20.   SPECIAL INSTRUCTIONS:  He was advised to call me for fever, chills, voiding  difficulty or gross hematuria.   CONDITION ON DISCHARGE:  Stable.      Miguel Dibble, M.D.  Electronically Signed     SK/MEDQ  D:  09/13/2005  T:  09/13/2005  Job:  [id number]   cc:   Angus G. Everette Rank, MD  Fax: [phone]

## 2011-03-16 NOTE — H&P (Signed)
Keith Rivera, Keith Rivera              ACCOUNT NO.:  [id number]   MEDICAL RECORD NO.:  [id number]          PATIENT TYPE:  INP   LOCATION:  A331                          FACILITY:  [facility]   PHYSICIAN:  Estill Bamberg. Karie Kirks, M.D.DATE OF BIRTH:  12/04/1950   DATE OF ADMISSION:  07/03/2005  DATE OF DISCHARGE:  LH                                HISTORY & PHYSICAL   This is a 60 year old man that developed BPH, bladder neck obstruction and  bilateral hydronephrosis resulting in CRF.  This was diagnosed in a routine  physical exam by his LMD Dr. Everette Rank in early August.  He was then seen by  Dr. Maryland Pink, urologist, who placed a Foley catheter and apparently the  patient drained 2 L of urine.  At the time of initial diagnosis he had a  bicarb of 17, a BUN 33, creatinine 4.9.  His CBC was normal although the H&H  are slightly low at 12.9 and 37.8.  His recheck BUN and creatinine was 30  and 4.1 several days later then it dropped to 28 and 3.9, 31 and 3.8, 43 and  3.6, and 35 and 3.1 over the last month.   He said during this time he developed a bladder/kidney infection with fever  and chills and had to be out of work where he usually is at [employer] (and  where he has been for 29 years).  He has been working from home, however.   He has generally been healthy other than hypercholesterolemia treated by Dr.  Everette Rank with Lipitor.  Really has not required surgery.  He has had Hytrin 5  mg daily for BPH and hypertension, however.   Temperature this evening is 99.4, pulse 88, respiratory rate 20, blood  pressure 112/71.  He appears to be oriented, alert, in no acute distress.  His heart is a regular rhythm, rate of 70.  Lungs are clear throughout  moving air well.  The abdomen is soft without hepatosplenomegaly or mass.  Skin turgor is good.  Color is good.   Attempt was made at ureteral stent placement but both distal ureters were  very tortuous.  Plans for this to be accomplished through the  interventional  radiology at [facility] tomorrow.   ASSESSMENT:  1.  Benign prostatic hypertrophy with bladder neck obstruction.  2.  Chronic renal failure now with creatinine staying elevated over a month.  3.  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  4.  Hypercholesterolemia.  5.  Status post left inguinal hernia repair.  6.  Status post bilateral vasectomy.   PLAN:  Continue the current meds of Keflex 500 mg b.i.d., ambulation, IV  fluids of 1/2-normal saline 100 mL/hr, Foley catheter, morphine for severe  pain, Cipro 200 mg IV q.24 h.  Interventional radiology to see tomorrow as  already noted.      Estill Bamberg. Karie Kirks, M.D.  Electronically Signed     SDK/MEDQ  D:  07/04/2005  T:  07/04/2005  Job:  [id number]

## 2011-03-16 NOTE — H&P (Signed)
NAMEYOSSEF, RETANA              ACCOUNT NO.:  [id number]   MEDICAL RECORD NO.:  [id number]          PATIENT TYPE:  AMB   LOCATION:  DAY                           FACILITY:  [facility]   PHYSICIAN:  Miguel Dibble, M.D.   DATE OF BIRTH:  10/21/1951   DATE OF ADMISSION:  08/02/2005  DATE OF DISCHARGE:  LH                                HISTORY & PHYSICAL   CHIEF COMPLAINT:  Benign prostatic hypertrophy, bladder outlet obstruction,  urinary retention, bilateral hydronephrosis, chronic renal failure.   HISTORY OF PRESENT ILLNESS:  A 60 year old male was referred to me by Dr.  Everette Rank.  He had partially obstructive symptoms for several years.  He has  taken Hytrin with good results.  Urinary frequency x4-5, nocturia x1.  He  had good urinary stream most of the time with only occasional slowing of the  urinary stream.  During a complete physical examination by Dr. Everette Rank in  August 2006, he was noted to have a distended bladder.  Catheterization  revealed a large amount of post void residual urine.  The patient is  admitted to the hospital for further evaluation.  He has significant post  obstructive diuresis which resolved slowly.  The patient had bilateral  severe hydronephrosis.  His BUN and creatinine were elevated.  Even after  catheter drainage, his BUN and creatinine did not return to normal levels.  He had chronic renal failure.  His BUN was 35 and creatinine was 3.2.  Repeat ultrasound revealed essential bilateral hydronephrosis.  The patient  was taken to the OR and he has undergone cystoscopy and bilateral retrograde  pyelograms.  Ureters were found to be tortuous and dilated.  Guidewires and  stents could not be placed into the renal pelves.  The patient has undergone  bilateral percutaneous nephrostomies.  This has not made any change in his  renal function.  He also has had right antegrade stent placement with  removal of the nephrostomy on the right side.  He still has left  percutaneous nephrostomy.  His renal function has not improved even with the  nephrostomy drainage.  It is not possible to place a stent on the left side  due to tortuosity of the ureter.  The patient is still on catheter drainage.  He is brought to the short stay center today for cystoscopy, left retrograde  pyelogram, possible left ureteral stent placement and transurethral  resection of the prostate.   PAST MEDICAL HISTORY:  1.  Benign prostatic hypertrophy.  2.  Bladder outlet obstruction.  3.  Chronic urinary retention.  4.  Bilateral hydronephrosis.  5.  status post left inguinal hernia repair.  6.  Status post bilateral vasectomy.   MEDICATIONS:  1.  Lipitor 10 mg one p.o. daily.  2.  Hytrin 5 mg one p.o. at night.  3.  Cipro 250 mg p.o. twice a day   ALLERGIES:  None.   PHYSICAL EXAMINATION:  HEENT:  Normal.  NECK:  No masses.  LUNGS:  Clear to auscultation.  HEART:  Regular rate and rhythm. No murmurs.  ABDOMEN:  Soft.  No palpable  flank masses.  No costovertebral angle  tenderness. Left percutaneous nephrostomy is draining clear urine.  GU:  No suprapubic tenderness. Penis normal.  Foley catheter is draining  clear urine.  Testes are normal.  RECTAL:  Benign prostate, about 40 g in size.   LABORATORY DATA:  Recent CT scan revealed a small atrophic right kidney.  Left kidney was normal size.   IMPRESSION:  1.  Chronic urinary retention.  2.  Benign prostatic hypertrophy with bladder outlet obstruction.  3.  Bilateral hydronephrosis.  4.  Chronic renal failure.   PLAN:  Cystoscopy, left retrograde pyelogram, possible left ureteral stent  placement and transurethral resection of the prostate in short stay center.  I have discussed with the patient regarding the diagnosis, operative  details, alternate treatments, outcome, possible risks and complications.  He agreed for the procedure to be done.      Miguel Dibble, M.D.  Electronically Signed     SK/MEDQ   D:  08/02/2005  T:  08/02/2005  Job:  [id number]   cc:   Angus G. Everette Rank, MD  Fax: [facility]  Fax: [phone]

## 2011-03-16 NOTE — Op Note (Signed)
NAMEBAYLE, Keith              ACCOUNT NO.:  [id number]   MEDICAL RECORD NO.:  [id number]          PATIENT TYPE:  AMB   LOCATION:  DAY                           FACILITY:  [facility]   PHYSICIAN:  Miguel Dibble, M.D.   DATE OF BIRTH:  1951/04/29   DATE OF PROCEDURE:  11/05/2005  DATE OF DISCHARGE:                                 OPERATIVE REPORT   PREOPERATIVE DIAGNOSIS:  Bilateral hydronephrosis, chronic urinary  retention, chronic renal failure, benign prostatic hypertrophy with bladder  neck obstruction, status post bilateral ureteral stent insertion.   POSTOPERATIVE DIAGNOSIS:  Bilateral hydronephrosis, chronic urinary  retention, chronic renal failure, benign prostatic hypertrophy with bladder  neck obstruction, status post bilateral ureteral stent insertion.   OPERATIVE PROCEDURE:  Cystoscopy, removal of bilateral ureteral stents,  bilateral retrograde pyelograms, bilateral ureteral stent replacement.   ANESTHESIA:  Spinal.   SURGEON:  Dr. Maryland Pink.   COMPLICATIONS:  None.   DRAINS:  6-French, 28-cm size ureteral stent x2.   INDICATIONS FOR PROCEDURE:  This is 60 year old male was admitted to  hospital with chronic urinary retention and chronic renal failure. He had  BPH with bladder neck obstruction and bilateral hydronephrosis. Percutaneous  nephrostomy and bilateral antegrade stent placement were done. The patient  had undergone transurethral resection of the prostate a few months ago. He  had been voiding well. He was brought to the OR today for cystoscopy,  removal of the bilateral ureteral stents, bilateral retrograde pyelograms  and possible replacement of ureteral stents.   DESCRIPTION OF PROCEDURE:  Spinal anesthesia was induced, and the patient  was placed on the OR table in the dorsolithotomy position. The lower abdomen  and genitalia were prepped and draped in a sterile fashion. Cystoscopy was  done with a 25-French scope. The urethra was normal. There was  evidence of  previous TURP, and the prostatic ureter was opened. Bladder was then  examined and found to contain two ureteral stents. The lower end of the  right ureteral stent was then held with biopsy forceps and removed without  any difficulty. Left ureteral stent was also removed. There were no stone  encrustations on this stents. There was Prolene suture about 25 cm in length  which was attached to the upper end of the left ureteral stent. There was no  suture on the right side.   A 5-French wedge catheter was then placed in the right ureteral orifice.  Retrograde pyelogram was done using C-arm fluoroscopy. The entire ureter was  found to be moderately dilated with tortuosity, and there was moderate right  hydronephrosis. A 5-French open-ended catheter was then placed in the right  distal ureter. A Glidewire was inserted into the left renal pelvis. The open-  ended catheter was then inserted up to the level of the upper ureter. The  Glidewire was removed. Contrast was injected, and the collecting system was  opacified. A 28-French 6-cm stent  was then inserted into the right collecting system. Left retrograde  pyelogram revealed a dilated, tortuous left ureter with moderate left  hydronephrosis. The left ureteral stent was placed in a similar fashion.  The  instruments were then removed. The patient was transferred to the PACU in  satisfactory condition.      Miguel Dibble, M.D.  Electronically Signed     SK/MEDQ  D:  11/05/2005  T:  11/05/2005  Job:  [id number]

## 2011-03-16 NOTE — H&P (Signed)
Keith Rivera, Keith Rivera              ACCOUNT NO.:  [id number]   MEDICAL RECORD NO.:  [id number]          PATIENT TYPE:  AMB   LOCATION:  DAY                           FACILITY:  [facility]   PHYSICIAN:  Miguel Dibble, M.D.   DATE OF BIRTH:  17-Mar-1951   DATE OF ADMISSION:  07/03/2005  DATE OF DISCHARGE:  LH                                HISTORY & PHYSICAL   CHIEF COMPLAINT:  Urinary retention, enlarged prostate, chronic renal  failure.   HISTORY OF PRESENT ILLNESS:  This 60 year old male was referred to me by Dr.  Everette Rank.  He has been treated for prostate obstructive symptoms for several  years with Hytrin.  He had urinary frequency x4 to 5 and nocturia x0.  He  had good urinary stream most of the time.  He has noticed occasional slowing  of the urinary stream.  During complete physical examination by Dr. Everette Rank,  he was found to have a distended bladder.  The patient was found to be in  chronic urinary retention, and he was admitted to the hospital.  Foley  catheter was inserted, and a large amount of postvoid residual was noted.  The patient developed postobstructive diuresis, and this cleared after  several days.  The patient was evaluated by abdominal ultrasound. This  revealed a massive bilateral hydronephrosis and distended bladder.  The  patient's renal failure improves only slightly with catheter drainage.  He  has been on catheter drainage for a few weeks at present.   Renal function revealed chronic renal failure with a creatinine of 3.9.  Prerenal ultrasound revealed minimal bilateral hydronephrosis.  The patient  is brought to the short stay center today for cystoscopy, bilateral  retrograde pyelograms, possible ureteral stent placement as well as  transurethral resection of the prostate.  He has been treated for urinary  tract infection with appropriate antibiotics.   PAST MEDICAL HISTORY:  1.  History of hypercholesterolemia.  2.  BPH with bladder neck obstruction.  3.   Chronic urinary retention.  4.  Chronic renal failure.  5.  Status post left inguinal hernia repair.  6.  Status post bilateral vasectomy.   MEDICATIONS:  1.  Hytrin 5 mg 1 p.o. nightly.  2.  Lipitor 10 mg 1 p.o. daily.   ALLERGIES:  None.   FAMILY HISTORY:  Positive for colon cancer.   PHYSICAL EXAMINATION:  HEAD, EYES, EARS, NOSE, AND THROAT:  Normal  NECK:  No masses.  LUNGS:  Clear to auscultation.  HEART: Regular rate and rhythm, no murmurs.  ABDOMEN:  Soft, no palpable flank mass or CVA tenderness.  Bladder not  palpable.  No suprapubic tenderness.  GU:  Foley catheter is draining clear urine.  Penis and testes are normal.  RECTAL:  40 g benign prostate.   IMPRESSION:  1.  Chronic urinary retention.  2.  Benign prostatic hypertrophy with bladder neck obstruction.  3.  Chronic renal failure.  4.  Bilateral hydronephrosis.   PLAN:  Cystoscopy, bilateral retrograde pyelogram, possible bilateral  ureteral stent placement, and transurethral resection of the prostate under  anesthesia in short-stay center.  He will be admitted to the hospital in the  postoperative period.  I discussed with the patient regarding the diagnosis,  operative details, alternative treatments, outcome, possible risks and  complications, and he has agreed for the procedure to be done.      Miguel Dibble, M.D.  Electronically Signed     SK/MEDQ  D:  07/02/2005  T:  07/02/2005  Job:  [id number]   cc:   Angus G. Everette Rank, [place]  [place] [address]  Fax: [phone]   Alison Murray, M.D.  [address]  [place]  [place] [address]  Fax: [facility]  Fax: [phone]

## 2011-03-16 NOTE — Discharge Summary (Signed)
Keith Rivera, Keith Rivera              ACCOUNT NO.:  [id number]   MEDICAL RECORD NO.:  [id number]          PATIENT TYPE:  INP   LOCATION:  A312                          FACILITY:  [facility]   PHYSICIAN:  Miguel Dibble, M.D.   DATE OF BIRTH:  02/13/1951   DATE OF ADMISSION:  06/04/2005  DATE OF DISCHARGE:  08/11/2006LH                                 DISCHARGE SUMMARY   ATTENDING PHYSICIAN:  Miguel Dibble, M.D.   CONSULTING PHYSICIAN:  Dr. Lowanda Foster, and Dr. Everette Rank.   FINAL DIAGNOSES:  1.  Chronic urinary retention.  2.  Acute on chronic renal failure.  3.  Benign prostatic hypertrophy.  4.  Bladder neck obstruction.  5.  Bilateral hydronephrosis.  6.  Postobstructive diuresis.  7.  Hypercholesterolemia.   DISCHARGE SUMMARY:  This 60 year old male was referred to me by Dr. Everette Rank.  When I saw him the office, he had an enlarged prostate with bladder neck  obstruction.  He was taking Hytrin 5 mg 1 p.o. daily q.h.s. with good  results.  Noticed occasional slowing of the urinary stream.  He had urinary  frequency x 4-5 and nocturia x 1.  There was no history of fever, chills or  difficulty with gross hematuria.  He did not have urinary incontinence.  He  underwent a complete physical examination by Dr. Everette Rank recently.  The  urinary bladder was found to be distended up to the umbilicus.  Renal  ultrasound was done at [facility].  It revealed distended bladder  associated with bilateral massive hydronephrosis.  The patient was admitted  to the hospital for further treatment on June 04, 2005.   PAST MEDICAL HISTORY:  1.  Benign prostatic hypertrophy and bladder neck obstruction on Hytrin 5 mg      1 p.o. daily.  2.  Elevated cholesterol.  3.  Status post left inguinal hernia.  4.  Status post bilateral vasectomy.   MEDICATIONS:  1.  Lipitor 10 mg 1 p.o. daily.  2.  Hytrin 5 mg 1 p.o. q.h.s.   ALLERGIES:  None.   FAMILY HISTORY:  Unremarkable.   PHYSICAL EXAMINATION:   HEENT:  Head, eyes, ears, nose and throat normal.  NECK:  No masses.  LUNGS:  Clear to auscultation.  HEART:  Regular rate and rhythm with no murmurs.  ABDOMEN:  Soft.  No palpable flank mass.  No costovertebral angle  tenderness.  GENITOURINARY:  Bladder was found to be distended up the umbilicus.  Penis  and testes were normal.  RECTAL:  40 gram size prostate.   PROCEDURE:  A #16 French Foley catheter was inserted into the bladder.  The  patient had 1200 mL of postvoid residual urine.   ADMISSION LABORATORIES:  BUN elevated at 33, creatinine 4.9, sodium 138,  potassium 3.9.  CBC:  WBC 6800, hemoglobin 12.9, hematocrit 37.8.   HOSPITAL COURSE:  The patient was admitted to the hospital, and was started  on IV fluids.  Dr. Lowanda Foster was consulted for management of postobstructive diuresis and  acute renal failure.  The patient had postobstructive diuresis, and his  fluid and electrolyte  balances were observed closely by Dr. Lowanda Foster.  He  had a urine output of 2400 mL per shift.  His BUN came down to 13;  creatinine was 4.1.  Sodium 141, potassium 3.9.  He was continued on IV  fluids.  The patient still had significant postobstructive diuresis.  His  BUN was 28 on June 06, 2005 with a creatinine of 3.9.  Urine culture and  sensitivity revealed no growth.  The patient had gradual improvement of the  postobstructive diuresis.  The renal function, however, was stable with a  BUN of 31 and creatinine of 3.8.  The patient's urine output was 1750 mL per  shift on June 08, 2005.  The patient wanted to be discharged.  He was  cleared by Dr. Lowanda Foster.  He was discharged with the Foley catheter and a  leg bag on June 08, 2005.  He will follow up with Dr. Lowanda Foster for  monitoring the renal function.  He was given amoxicillin 250 mg 1 p.o.  t.i.d. for 7 days.  He will return to the office in one week.  Plan to do  TURP as an outpatient.  He was advised to call me for any fever, chills,   gross hematuria or blocked catheter.      Miguel Dibble, M.D.  Electronically Signed     SK/MEDQ  D:  06/27/2005  T:  06/27/2005  Job:  [id number]   cc:   Angus G. Everette Rank, [place]  [place] [address]  Fax: [phone]   Alison Murray, M.D.  [address]  [place]  [place] [address]  Fax: [phone]

## 2011-03-16 NOTE — H&P (Signed)
NAMETAIWO, Keith Rivera              ACCOUNT NO.:  [id number]   MEDICAL RECORD NO.:  [id number]          PATIENT TYPE:  AMB   LOCATION:  DAY                           FACILITY:  [facility]   PHYSICIAN:  Miguel Dibble, M.D.   DATE OF BIRTH:  01/30/1951   DATE OF ADMISSION:  08/02/2005  DATE OF DISCHARGE:  LH                                HISTORY & PHYSICAL   CHIEF COMPLAINT:  Benign prostatic hypertrophy, bladder outlet obstruction,  urinary retention, bilateral hydronephrosis, chronic renal failure.   HISTORY OF PRESENT ILLNESS:  This 60 year old male is referred to me by Dr.  Everette Rank.  He had prostatic obstructive symptoms for several years.  He was  taking Hytrin with good results.  He had urinary frequency x4, nocturia X1  and good urinary stream most of the time.  He had noticed occasional slowing  of the urinary stream.  During a complete physical examination by Dr.  Everette Rank in August 2006, he was noted to have a distended bladder.  Catheterization revealed a large amount of post void residual urine.  The  patient was admitted to the hospital for further evaluation.  He had  significant post obstructive diuresis which resolved slowly.  Renal  ultrasound revealed a marked bilateral hydronephrosis.  His BUN and  creatinine were elevated.  Even after catheter drainage his BUN and  creatinine did not return to normal levels.  He had chronic renal failure.  Repeat renal ultrasound revealed persistent bilateral hydronephrosis.  The  patient was taken to the operating room and has undergone cystoscopy and  bilateral retrograde pyelograms.  The ureters were found to be tortuous and  dilated.  A guide wire could not be passed into the renal pelvis on both  sides.  Subsequently the patient has undergone bilateral percutaneous  nephrostomies by the interventional radiologist.  Renal function did not  improve with the percutaneous nephrostomy drainage.  The patient also had  right antegrade stent  placement with removal of nephrostomy on the right  side.  He still has left percutaneous nephrostomy.  His renal function has  not improved with nephrostomy drainage.  It is not possible to place a stent  on the left side due to tortuosity of the ureter.  The patient is still on  left percutaneous nephrostomy catheter drainage as well as Foley catheter  drainage.  He is brought to the short stay center today for cystoscopy, left  retrograde pyelogram, possible left ureteral stent placement and  transurethral resection of the prostate.   PAST MEDICAL HISTORY:  Benign prostatic hypertrophy with bladder neck  obstruction, chronic urinary retention, bilateral hydronephrosis, chronic  renal failure, status post left inguinal hernia repair, status post  bilateral vasectomy, elevated cholesterol.   MEDICATIONS:  1.  Lipitor 10 mg p.o. daily.  2.  Hytrin 5 mg one p.o. q.h.s.  3.  Cipro 250 mg one p.o. b.i.d.   ALLERGIES:  None.   PHYSICAL EXAMINATION:  HEENT:  Normal.  NECK:  No masses.  LUNGS:  Clear to auscultation.  HEART:  Regular rate and rhythm with  no murmurs.  ABDOMEN:  Soft, no palpable flank masses.  No costovertebral angle  tenderness.  Left percutaneous nephrostomy catheter is draining clear urine.  GENITOURINARY:  No suprapubic tenderness.  Penis normal.  Foley catheter is  draining clear urine.  Testes are normal.  Rectal examination benign  prostate, about 40 grams in size.   CLINICAL DATA:  Recent CT scan without contrast reveals small right kidney.  Left kidney is normal in size.   IMPRESSION:  Chronic urinary retention, benign prostatic hypertrophy with  bladder neck obstruction, bilateral hydronephrosis, chronic renal failure.   PLAN:  Cystoscopy, left retrograde pyelogram, possible left ureteral stent  placement, transurethral resection of prostate in short stay center.  I  have discussed with the patient regarding the diagnosis, operative details,  alternative  treatments or outcome, possible risks and complications and he  has agreed for the procedure to be done.      Miguel Dibble, M.D.  Electronically Signed     SK/MEDQ  D:  08/02/2005  T:  08/02/2005  Job:  [id number]   cc:   Angus G. Everette Rank, MD  Fax: [phone]

## 2011-03-16 NOTE — H&P (Signed)
Keith Rivera, SPRINGER              ACCOUNT NO.:  [id number]   MEDICAL RECORD NO.:  [id number]          PATIENT TYPE:  AMB   LOCATION:  DAY                           FACILITY:  [facility]   PHYSICIAN:  Miguel Dibble, M.D.   DATE OF BIRTH:  07/24/1951   DATE OF ADMISSION:  02/11/2006  DATE OF DISCHARGE:  LH                                HISTORY & PHYSICAL   CHIEF COMPLAINT:  Bilateral hydronephrosis, chronic urinary retention,  benign prostatic hypertrophy.   HISTORY OF PRESENT ILLNESS:  This 60 year old male has chronic urinary  retention associated with bilateral hydronephrosis, possibly benign  prostatic hypertrophy with bladder neck obstruction.  He has undergone  bilateral ureteral stent placement and transurethral resection of the  prostate.  He had been voiding well at present; he has a good urinary  stream, urinary frequency x3-4, nocturia x0.  There is no history of flank  pain, fever or chills at present.  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  Bilateral  ureteral stents were placed in January of 2007.  The patient was brought to  the short stay center today for cystoscopy and removal of bilateral ureteral  stents.   PAST MEDICAL HISTORY:  1.  Elevated cholesterol.  2.  Chronic urinary retention.  3.  Chronic renal failure.  4.  Bilateral hydronephrosis.  5.  Status post bilateral ureteral stent placement.  6.  Status post TURP.   MEDICATIONS:  Lipitor 10 mg p.o. daily.   ALLERGIES:  None.   PHYSICAL EXAMINATION:  HEENT:  Normal.  NECK:  No masses.  LUNGS:  Clear to auscultation.  HEART:  Regular rate and rhythm, no murmurs.  ABDOMEN:  Soft.  No palpable flank mass, CVA tenderness or bladder  distention.  RECTAL:  Benign prostate, about 30 g in size.   IMPRESSION:  1.  Bilateral hydronephrosis, status post bilateral ureteral stent      placement.  2.  History of urinary tract infection.  3.  Benign  prostatic hypertrophy, status post transurethral resection of the      prostate.   PLAN:  I discussed with the patient regarding the management options.  He is  scheduled to undergo cystoscopy and removal of bilateral ureteral stents.  Will monitor his renal function closely after that.  Will do renal nuclear  imaging  studies to evaluate drainage of both kidneys.  I have discussed with the  patient regarding the diagnosis, operative details, alternate treatments,  outcome, possible risks and complications.  He has agreed for the procedure  to be done under anesthesia at [place], M.D.  Electronically Signed     SK/MEDQ  D:  02/10/2006  T:  02/10/2006  Job:  [id number]   cc:   Angus G. Everette Rank, MD  Fax: [phone]

## 2011-07-31 LAB — BASIC METABOLIC PANEL
BUN: 21
CO2: 23
Calcium: 9.1
Chloride: 101
Creatinine, Ser: 2.91 — ABNORMAL HIGH

## 2011-07-31 LAB — DIFFERENTIAL
Basophils Absolute: 0
Basophils Relative: 0
Lymphocytes Relative: 11 — ABNORMAL LOW
Lymphs Abs: 1.4
Monocytes Absolute: 1.2 — ABNORMAL HIGH
Neutrophils Relative %: 80 — ABNORMAL HIGH

## 2011-07-31 LAB — CBC
Hemoglobin: 16.4
MCHC: 33.9
MCV: 93.1
RDW: 14.5

## 2012-05-07 ENCOUNTER — Ambulatory Visit (HOSPITAL_COMMUNITY)
Admission: RE | Admit: 2012-05-07 | Discharge: 2012-05-07 | Disposition: A | Discharge status: Home / Self Care | Payer: BC Managed Care – PPO | Source: Ambulatory Visit | Attending: Family Medicine | Admitting: Family Medicine

## 2012-05-07 ENCOUNTER — Other Ambulatory Visit (HOSPITAL_COMMUNITY): Payer: Self-pay | Admitting: Family Medicine

## 2012-05-07 DIAGNOSIS — M545 Low back pain, unspecified: Secondary | ICD-10-CM | POA: Insufficient documentation

## 2012-05-07 DIAGNOSIS — M549 Dorsalgia, unspecified: Secondary | ICD-10-CM

## 2012-05-07 DIAGNOSIS — M546 Pain in thoracic spine: Secondary | ICD-10-CM | POA: Insufficient documentation

## 2012-12-24 ENCOUNTER — Other Ambulatory Visit (HOSPITAL_COMMUNITY): Payer: Self-pay | Admitting: Internal Medicine

## 2012-12-24 DIAGNOSIS — R011 Cardiac murmur, unspecified: Secondary | ICD-10-CM

## 2013-01-01 ENCOUNTER — Ambulatory Visit (HOSPITAL_COMMUNITY)
Admission: RE | Admit: 2013-01-01 | Discharge: 2013-01-01 | Disposition: A | Discharge status: Home / Self Care | Payer: BC Managed Care – PPO | Source: Ambulatory Visit | Attending: Internal Medicine | Admitting: Internal Medicine

## 2013-01-01 DIAGNOSIS — R011 Cardiac murmur, unspecified: Secondary | ICD-10-CM | POA: Insufficient documentation

## 2013-01-01 NOTE — Progress Notes (Signed)
2D Echo Performed 01/01/2013    Keith Rivera, RCS

## 2013-05-12 ENCOUNTER — Other Ambulatory Visit (HOSPITAL_COMMUNITY): Payer: Self-pay | Admitting: Family Medicine

## 2013-05-12 DIAGNOSIS — K7689 Other specified diseases of liver: Secondary | ICD-10-CM

## 2013-05-15 ENCOUNTER — Ambulatory Visit (HOSPITAL_COMMUNITY)
Admission: RE | Admit: 2013-05-15 | Discharge: 2013-05-15 | Disposition: A | Discharge status: Home / Self Care | Payer: BC Managed Care – PPO | Source: Ambulatory Visit | Attending: Family Medicine | Admitting: Family Medicine

## 2013-05-15 ENCOUNTER — Other Ambulatory Visit (HOSPITAL_COMMUNITY): Payer: Self-pay | Admitting: Family Medicine

## 2013-05-15 DIAGNOSIS — R9389 Abnormal findings on diagnostic imaging of other specified body structures: Secondary | ICD-10-CM | POA: Insufficient documentation

## 2013-05-15 DIAGNOSIS — K7689 Other specified diseases of liver: Secondary | ICD-10-CM

## 2013-05-15 DIAGNOSIS — F17210 Nicotine dependence, cigarettes, uncomplicated: Secondary | ICD-10-CM

## 2013-05-15 DIAGNOSIS — F17200 Nicotine dependence, unspecified, uncomplicated: Secondary | ICD-10-CM | POA: Insufficient documentation

## 2013-05-20 ENCOUNTER — Other Ambulatory Visit (HOSPITAL_COMMUNITY): Payer: Self-pay | Admitting: Family Medicine

## 2013-05-20 ENCOUNTER — Ambulatory Visit (HOSPITAL_COMMUNITY)
Admission: RE | Admit: 2013-05-20 | Discharge: 2013-05-20 | Disposition: A | Discharge status: Home / Self Care | Payer: BC Managed Care – PPO | Source: Ambulatory Visit | Attending: Family Medicine | Admitting: Family Medicine

## 2013-05-20 DIAGNOSIS — J449 Chronic obstructive pulmonary disease, unspecified: Secondary | ICD-10-CM

## 2013-05-20 DIAGNOSIS — R918 Other nonspecific abnormal finding of lung field: Secondary | ICD-10-CM | POA: Insufficient documentation

## 2013-05-21 ENCOUNTER — Other Ambulatory Visit (HOSPITAL_COMMUNITY): Payer: Self-pay | Admitting: Family Medicine

## 2013-05-21 DIAGNOSIS — R911 Solitary pulmonary nodule: Secondary | ICD-10-CM

## 2013-05-26 ENCOUNTER — Ambulatory Visit (HOSPITAL_COMMUNITY)
Admission: RE | Admit: 2013-05-26 | Discharge: 2013-05-26 | Disposition: A | Discharge status: Home / Self Care | Payer: BC Managed Care – PPO | Source: Ambulatory Visit | Attending: Family Medicine | Admitting: Family Medicine

## 2013-05-26 DIAGNOSIS — J438 Other emphysema: Secondary | ICD-10-CM | POA: Insufficient documentation

## 2013-05-26 DIAGNOSIS — R911 Solitary pulmonary nodule: Secondary | ICD-10-CM | POA: Insufficient documentation

## 2014-11-15 ENCOUNTER — Other Ambulatory Visit (HOSPITAL_COMMUNITY): Payer: Self-pay | Admitting: Family Medicine

## 2014-11-15 ENCOUNTER — Ambulatory Visit (HOSPITAL_COMMUNITY)
Admission: RE | Admit: 2014-11-15 | Discharge: 2014-11-15 | Disposition: A | Discharge status: Home / Self Care | Payer: 59 | Source: Ambulatory Visit | Attending: Family Medicine | Admitting: Family Medicine

## 2014-11-15 DIAGNOSIS — N281 Cyst of kidney, acquired: Secondary | ICD-10-CM | POA: Diagnosis not present

## 2014-11-15 DIAGNOSIS — R934 Abnormal findings on diagnostic imaging of urinary organs: Secondary | ICD-10-CM | POA: Insufficient documentation

## 2014-11-15 DIAGNOSIS — R319 Hematuria, unspecified: Secondary | ICD-10-CM

## 2014-11-15 DIAGNOSIS — R7989 Other specified abnormal findings of blood chemistry: Secondary | ICD-10-CM

## 2014-11-15 DIAGNOSIS — R310 Gross hematuria: Secondary | ICD-10-CM | POA: Diagnosis present

## 2014-11-15 DIAGNOSIS — R944 Abnormal results of kidney function studies: Secondary | ICD-10-CM | POA: Diagnosis not present

## 2014-11-22 ENCOUNTER — Other Ambulatory Visit (HOSPITAL_COMMUNITY): Payer: Self-pay | Admitting: Family Medicine

## 2014-11-22 DIAGNOSIS — N183 Chronic kidney disease, stage 3 (moderate): Secondary | ICD-10-CM

## 2014-11-23 ENCOUNTER — Other Ambulatory Visit (HOSPITAL_COMMUNITY): Payer: Self-pay | Admitting: Family Medicine

## 2014-11-23 DIAGNOSIS — R229 Localized swelling, mass and lump, unspecified: Principal | ICD-10-CM

## 2014-11-23 DIAGNOSIS — IMO0002 Reserved for concepts with insufficient information to code with codable children: Secondary | ICD-10-CM

## 2014-11-25 ENCOUNTER — Ambulatory Visit (HOSPITAL_COMMUNITY): Payer: Managed Care, Other (non HMO)

## 2014-11-29 ENCOUNTER — Other Ambulatory Visit (HOSPITAL_COMMUNITY): Payer: 59

## 2014-11-30 ENCOUNTER — Ambulatory Visit (HOSPITAL_COMMUNITY)
Admission: RE | Admit: 2014-11-30 | Discharge: 2014-11-30 | Disposition: A | Discharge status: Home / Self Care | Payer: Managed Care, Other (non HMO) | Source: Ambulatory Visit | Attending: Family Medicine | Admitting: Family Medicine

## 2014-11-30 DIAGNOSIS — IMO0002 Reserved for concepts with insufficient information to code with codable children: Secondary | ICD-10-CM

## 2014-11-30 DIAGNOSIS — N2889 Other specified disorders of kidney and ureter: Secondary | ICD-10-CM | POA: Insufficient documentation

## 2014-11-30 DIAGNOSIS — R229 Localized swelling, mass and lump, unspecified: Secondary | ICD-10-CM

## 2014-12-02 ENCOUNTER — Other Ambulatory Visit: Payer: Self-pay | Admitting: Urology

## 2014-12-02 ENCOUNTER — Encounter (HOSPITAL_COMMUNITY): Payer: Self-pay | Admitting: *Deleted

## 2014-12-02 NOTE — Anesthesia Preprocedure Evaluation (Addendum)
Anesthesia Evaluation  Patient identified by MRN, date of birth, ID band Patient awake    Reviewed: Allergy & Precautions, H&P , NPO status , Patient's Chart, lab work & pertinent test results  Airway Mallampati: II  TM Distance: >3 FB Neck ROM: full    Dental  (+) Caps, Dental Advisory Given Right upper front lateral is bridged in:   Pulmonary Current Smoker,  breath sounds clear to auscultation  Pulmonary exam normal       Cardiovascular Exercise Tolerance: Good negative cardio ROS  Rhythm:regular Rate:Normal     Neuro/Psych negative neurological ROS  negative psych ROS   GI/Hepatic negative GI ROS, Neg liver ROS,   Endo/Other  negative endocrine ROS  Renal/GU negative Renal ROS  negative genitourinary   Musculoskeletal   Abdominal   Peds  Hematology negative hematology ROS (+)   Anesthesia Other Findings   Reproductive/Obstetrics negative OB ROS                            Anesthesia Physical Anesthesia Plan  ASA: II  Anesthesia Plan: General   Post-op Pain Management:    Induction: Intravenous  Airway Management Planned: LMA  Additional Equipment:   Intra-op Plan:   Post-operative Plan:   Informed Consent: I have reviewed the patients History and Physical, chart, labs and discussed the procedure including the risks, benefits and alternatives for the proposed anesthesia with the patient or authorized representative who has indicated his/her understanding and acceptance.   Dental Advisory Given  Plan Discussed with: CRNA and Surgeon  Anesthesia Plan Comments:         Anesthesia Quick Evaluation

## 2014-12-03 ENCOUNTER — Ambulatory Visit (HOSPITAL_COMMUNITY): Payer: Managed Care, Other (non HMO) | Admitting: Anesthesiology

## 2014-12-03 ENCOUNTER — Other Ambulatory Visit (HOSPITAL_COMMUNITY): Payer: Self-pay | Admitting: Urology

## 2014-12-03 ENCOUNTER — Ambulatory Visit (HOSPITAL_COMMUNITY)
Admission: RE | Admit: 2014-12-03 | Discharge: 2014-12-03 | Disposition: A | Discharge status: Home / Self Care | Payer: Managed Care, Other (non HMO) | Source: Ambulatory Visit | Attending: Urology | Admitting: Urology

## 2014-12-03 ENCOUNTER — Encounter (HOSPITAL_COMMUNITY)
Admission: RE | Disposition: A | Discharge status: Home / Self Care | Payer: Self-pay | Source: Ambulatory Visit | Attending: Urology

## 2014-12-03 DIAGNOSIS — N3030 Trigonitis without hematuria: Secondary | ICD-10-CM | POA: Insufficient documentation

## 2014-12-03 DIAGNOSIS — N2889 Other specified disorders of kidney and ureter: Secondary | ICD-10-CM | POA: Diagnosis not present

## 2014-12-03 DIAGNOSIS — E785 Hyperlipidemia, unspecified: Secondary | ICD-10-CM | POA: Insufficient documentation

## 2014-12-03 DIAGNOSIS — R310 Gross hematuria: Secondary | ICD-10-CM

## 2014-12-03 DIAGNOSIS — N3080 Other cystitis without hematuria: Secondary | ICD-10-CM | POA: Diagnosis not present

## 2014-12-03 DIAGNOSIS — F17210 Nicotine dependence, cigarettes, uncomplicated: Secondary | ICD-10-CM | POA: Insufficient documentation

## 2014-12-03 DIAGNOSIS — Z79899 Other long term (current) drug therapy: Secondary | ICD-10-CM | POA: Diagnosis not present

## 2014-12-03 HISTORY — PX: CYSTOSCOPY WITH RETROGRADE PYELOGRAM, URETEROSCOPY AND STENT PLACEMENT: SHX5789

## 2014-12-03 LAB — CBC
HCT: 45.8 % (ref 39.0–52.0)
Hemoglobin: 15.2 g/dL (ref 13.0–17.0)
MCH: 31.4 pg (ref 26.0–34.0)
MCHC: 33.2 g/dL (ref 30.0–36.0)
MCV: 94.6 fL (ref 78.0–100.0)
Platelets: 172 10*3/uL (ref 150–400)
RBC: 4.84 MIL/uL (ref 4.22–5.81)
RDW: 14 % (ref 11.5–15.5)
WBC: 22.4 10*3/uL — ABNORMAL HIGH (ref 4.0–10.5)

## 2014-12-03 SURGERY — CYSTOURETEROSCOPY, WITH RETROGRADE PYELOGRAM AND STENT INSERTION
Anesthesia: General | Laterality: Bilateral

## 2014-12-03 MED ORDER — FENTANYL CITRATE 0.05 MG/ML IJ SOLN
INTRAMUSCULAR | Status: AC
  Filled 2014-12-03: qty 2

## 2014-12-03 MED ORDER — CIPROFLOXACIN IN D5W 200 MG/100ML IV SOLN
200.0000 mg | INTRAVENOUS | Status: AC
  Administered 2014-12-03: 200 mg via INTRAVENOUS
  Filled 2014-12-03: qty 100

## 2014-12-03 MED ORDER — SODIUM CHLORIDE 0.9 % IR SOLN
Status: DC | PRN
  Administered 2014-12-03: 3000 mL

## 2014-12-03 MED ORDER — EPHEDRINE SULFATE 50 MG/ML IJ SOLN
INTRAMUSCULAR | Status: AC
  Filled 2014-12-03: qty 1

## 2014-12-03 MED ORDER — LACTATED RINGERS IV SOLN: INTRAVENOUS | Status: DC

## 2014-12-03 MED ORDER — MIDAZOLAM HCL 2 MG/2ML IJ SOLN
INTRAMUSCULAR | Status: AC
  Filled 2014-12-03: qty 2

## 2014-12-03 MED ORDER — 0.9 % SODIUM CHLORIDE (POUR BTL) OPTIME
TOPICAL | Status: DC | PRN
  Administered 2014-12-03: 1000 mL

## 2014-12-03 MED ORDER — FENTANYL CITRATE 0.05 MG/ML IJ SOLN: 25.0000 ug | INTRAMUSCULAR | Status: DC | PRN

## 2014-12-03 MED ORDER — SODIUM CHLORIDE 0.9 % IV SOLN
INTRAVENOUS | Status: DC
  Administered 2014-12-03: 1000 mL via INTRAVENOUS

## 2014-12-03 MED ORDER — PROPOFOL 10 MG/ML IV BOLUS
INTRAVENOUS | Status: DC | PRN
  Administered 2014-12-03: 200 mg via INTRAVENOUS

## 2014-12-03 MED ORDER — LIDOCAINE HCL (CARDIAC) 20 MG/ML IV SOLN
INTRAVENOUS | Status: AC
Start: 2014-12-03 — End: 2014-12-03
  Filled 2014-12-03: qty 5

## 2014-12-03 MED ORDER — ONDANSETRON HCL 4 MG/2ML IJ SOLN
INTRAMUSCULAR | Status: DC | PRN
  Administered 2014-12-03: 4 mg via INTRAVENOUS

## 2014-12-03 MED ORDER — SODIUM CHLORIDE 0.9 % IJ SOLN
INTRAMUSCULAR | Status: AC
  Filled 2014-12-03: qty 10

## 2014-12-03 MED ORDER — MIDAZOLAM HCL 5 MG/5ML IJ SOLN
INTRAMUSCULAR | Status: DC | PRN
  Administered 2014-12-03: 1 mg via INTRAVENOUS

## 2014-12-03 MED ORDER — SODIUM CHLORIDE 0.9 % IV SOLN
INTRAVENOUS | Status: DC | PRN
  Administered 2014-12-03 (×2): via INTRAVENOUS

## 2014-12-03 MED ORDER — PHENYLEPHRINE 40 MCG/ML (10ML) SYRINGE FOR IV PUSH (FOR BLOOD PRESSURE SUPPORT)
PREFILLED_SYRINGE | INTRAVENOUS | Status: AC
  Filled 2014-12-03: qty 10

## 2014-12-03 MED ORDER — EPHEDRINE SULFATE 50 MG/ML IJ SOLN
INTRAMUSCULAR | Status: DC | PRN
  Administered 2014-12-03 (×2): 5 mg via INTRAVENOUS

## 2014-12-03 MED ORDER — PROPOFOL 10 MG/ML IV BOLUS
INTRAVENOUS | Status: AC
  Filled 2014-12-03: qty 20

## 2014-12-03 MED ORDER — FENTANYL CITRATE 0.05 MG/ML IJ SOLN
INTRAMUSCULAR | Status: DC | PRN
  Administered 2014-12-03: 100 ug via INTRAVENOUS

## 2014-12-03 MED ORDER — HYDROCODONE-ACETAMINOPHEN 7.5-325 MG PO TABS: 1.0000 | ORAL_TABLET | ORAL | Status: DC | PRN

## 2014-12-03 MED ORDER — CIPROFLOXACIN HCL 500 MG PO TABS: 500.0000 mg | ORAL_TABLET | ORAL | Status: DC

## 2014-12-03 MED ORDER — LIDOCAINE HCL 1 % IJ SOLN
INTRAMUSCULAR | Status: DC | PRN
  Administered 2014-12-03: 50 mg via INTRADERMAL

## 2014-12-03 MED ORDER — TAMSULOSIN HCL 0.4 MG PO CAPS
0.4000 mg | ORAL_CAPSULE | Freq: Once | ORAL | Status: AC
  Administered 2014-12-03: 0.4 mg via ORAL
  Filled 2014-12-03: qty 1

## 2014-12-03 MED ORDER — DEXTROSE 5 % IV SOLN
2.0000 g | Freq: Once | INTRAVENOUS | Status: AC
  Administered 2014-12-03: 2 g via INTRAVENOUS
  Filled 2014-12-03: qty 2

## 2014-12-03 MED ORDER — IOHEXOL 300 MG/ML  SOLN
INTRAMUSCULAR | Status: DC | PRN
  Administered 2014-12-03: 30 mL

## 2014-12-03 MED ORDER — KETOROLAC TROMETHAMINE 30 MG/ML IJ SOLN
INTRAMUSCULAR | Status: AC
  Filled 2014-12-03: qty 1

## 2014-12-03 MED ORDER — PHENYLEPHRINE HCL 10 MG/ML IJ SOLN
INTRAMUSCULAR | Status: DC | PRN
  Administered 2014-12-03: 80 ug via INTRAVENOUS
  Administered 2014-12-03 (×2): 40 ug via INTRAVENOUS
  Administered 2014-12-03 (×3): 80 ug via INTRAVENOUS

## 2014-12-03 MED ORDER — ONDANSETRON HCL 4 MG/2ML IJ SOLN
INTRAMUSCULAR | Status: AC
  Filled 2014-12-03: qty 2

## 2014-12-03 SURGICAL SUPPLY — 20 items
BAG URO CATCHER STRL LF (DRAPE) ×2 IMPLANT
BASKET ZERO TIP NITINOL 2.4FR (BASKET) IMPLANT
BSKT STON RTRVL ZERO TP 2.4FR (BASKET)
CATH INTERMIT  6FR 70CM (CATHETERS) ×2 IMPLANT
CLOTH BEACON ORANGE TIMEOUT ST (SAFETY) ×2 IMPLANT
FIBER LASER FLEXIVA 1000 (UROLOGICAL SUPPLIES) IMPLANT
FIBER LASER FLEXIVA 200 (UROLOGICAL SUPPLIES) IMPLANT
FIBER LASER FLEXIVA 365 (UROLOGICAL SUPPLIES) IMPLANT
FIBER LASER FLEXIVA 550 (UROLOGICAL SUPPLIES) IMPLANT
FIBER LASER TRAC TIP (UROLOGICAL SUPPLIES) IMPLANT
GLOVE BIOGEL M 8.0 STRL (GLOVE) ×2 IMPLANT
GOWN STRL REUS W/ TWL XL LVL3 (GOWN DISPOSABLE) ×1 IMPLANT
GOWN STRL REUS W/TWL XL LVL3 (GOWN DISPOSABLE) ×4 IMPLANT
GUIDEWIRE ANG ZIPWIRE 038X150 (WIRE) ×1 IMPLANT
GUIDEWIRE STR DUAL SENSOR (WIRE) ×2 IMPLANT
MANIFOLD NEPTUNE II (INSTRUMENTS) ×2 IMPLANT
PACK CYSTO (CUSTOM PROCEDURE TRAY) ×2 IMPLANT
SHIELD EYE BINOCULAR (MISCELLANEOUS) IMPLANT
SYRINGE 20CC LL (MISCELLANEOUS) ×1 IMPLANT
TUBING CONNECTING 10 (TUBING) ×2 IMPLANT

## 2014-12-03 NOTE — Anesthesia Postprocedure Evaluation (Signed)
  Anesthesia Post-op Note  Patient: Keith Rivera  Procedure(s) Performed: Procedure(s) (LRB): CYSTOSCOPY WITH BILATERAL RETROGRADE PYELOGRAM, BILATERAL  URETEROSCOPY  (Bilateral)  Patient Location: PACU  Anesthesia Type: General  Level of Consciousness: awake and alert   Airway and Oxygen Therapy: Patient Spontanous Breathing  Post-op Pain: mild  Post-op Assessment: Post-op Vital signs reviewed, Patient's Cardiovascular Status Stable, Respiratory Function Stable, Patent Airway and No signs of Nausea or vomiting  Last Vitals:  Filed Vitals:   12/03/14 1035  BP:   Pulse: 83  Temp:   Resp: 18    Post-op Vital Signs: stable   Complications: No apparent anesthesia complications

## 2014-12-03 NOTE — Transfer of Care (Signed)
Immediate Anesthesia Transfer of Care Note  Patient: Keith Rivera  Procedure(s) Performed: Procedure(s): CYSTOSCOPY WITH BILATERAL RETROGRADE PYELOGRAM, BILATERAL  URETEROSCOPY  (Bilateral)  Patient Location: PACU  Anesthesia Type:General  Level of Consciousness: awake, alert , oriented and patient cooperative  Airway & Oxygen Therapy: Patient Spontanous Breathing and Patient connected to face mask oxygen  Post-op Assessment: Report given to RN, Post -op Vital signs reviewed and stable and Patient moving all extremities  Post vital signs: Reviewed and stable  Last Vitals:  Filed Vitals:   12/03/14 0632  BP: 105/66  Pulse: 95  Temp: 36.8 C  Resp: 16    Complications: No apparent anesthesia complications

## 2014-12-03 NOTE — Discharge Instructions (Signed)

## 2014-12-03 NOTE — Op Note (Signed)
PATIENT:  Keith Rivera  Preoperative diagnosis:  1. Gross hematuria 2. Central right renal mass   Postoperative diagnosis:  1. Gross hematuria 2. Central right renal mass (not involving the collecting system) 3. Cystitis cystica and cystitis follicularis  Procedure:  1. Cystoscopy 2. Bilateral retrograde pyelograms with interpretation. 3. Bilateral ureteroscopy  Surgeon: Elta Guadel[place] C. Karsten Ro, M.D.  Anesthesia: General  Complications: None  EBL: Minimal  Specimens: None  Findings: 1. Large capacity bladder 2. Cystitis cystica and cystitis follicularis of the bladder. 3. Ureteral follicularis of the right ureter 4. Pyelitis cystica and cystitis follicularis of the right renal pelvis 5. No evidence of right sided transitional cell carcinoma 6. No intraluminal abnormality of the left ureter or collecting system 7. Bilateral ureteral tortuosity. 8. No abnormality of the upper tract urothelium on the left-hand side.  Indication: Keith Rivera is a 64 year old patient who experienced gross hematuria. He was found on ultrasound to have a possible solid lesion in the midportion of the right kidney. A follow-up MRI scan revealed a markedly atrophic right kidney with bilateral renal cysts and a 2.4 cm lesion that appeared to possibly be a hemorrhagic cyst versus a solid mass adjacent to the collecting system. He also was noted to have some thickening of the urothelium on the left-hand side in the area of the kidney. After reviewing the management options for treatment, they have elected to proceed with the above surgical procedure(s). We have discussed the potential benefits and risks of the procedure, side effects of the proposed treatment, the likelihood of the patient achieving the goals of the procedure, and any potential problems that might occur during the procedure or recuperation. Informed consent has been obtained.  Description of procedure:  The patient was taken to the  operating room and general anesthesia was induced.  The patient was placed in the dorsal lithotomy position, prepped and draped in the usual sterile fashion, and preoperative antibiotics were administered. A preoperative time-out was performed.   Cystourethroscopy was performed.  The patient's urethra was examined and was normal/ demonstrated bilobar prostatic hypertrophy but no lesions. The bladder was then systematically examined in its entirety. There was no evidence for any bladder tumors, stones, or other mucosal pathology.  I did however note the bladder appeared to be of an elevated capacity although his capacity under anesthesia was not measured. The ureteral orifices were noted to be of normal configuration and position. I was able to identify an area of cystitis follicularis just inside the ureteral orifice on the right-hand side.  Attention then turned to the left ureteral orifice and a ureteral catheter was used to intubate the ureteral orifice.  Omnipaque contrast was injected through the ureteral catheter and a retrograde pyelogram was performed. This revealed tortuosity of the ureter and what appeared to be a possible filling defect just inside the ureteral orifice. The remainder the ureter was free of any mucosal abnormalities or mass effect. The intrarenal collecting system was also free of any lesions. Contrast was observed to pass unobstructed down the ureter under fluoroscopy.  I then turned my attention to the right ureteral orifice. Again the 6 [place] open-ended catheter was passed through the cystoscope and into the right orifice. Full-strength contrast was injected on this side under direct fluoroscopy. It also revealed tortuosity of the ureter with no filling defects within the ureter. There did not appear to be any definite filling defects within the collecting system.  A 0.038 sensor guidewire was then advanced up the right ureter  into the renal pelvis under fluoroscopic guidance.  In order to gain access to the renal pelvis I had to first passed the sensor wire up to the upper portion of the ureter and then, because I could not advance this into the area the renal pelvis because of ureteral tortuosity, I removed the sensor wire and replaced with a Glidewire. With the Glidewire I was able to gain access into the renal pelvis and then passed the open-ended catheter over the guidewire straightening the tortuosity and allowing replacement of the sensor wire. This was then left in place and the open-ended catheter was removed.  I then passed the 6 French flexible, digital ureteroscope over the guidewire and up the ureter into the area the renal pelvis under fluoroscopy. The guidewire was then removed. The intrarenal collecting system was inspected systematically both visually and with confirmation of location by intermittent fluoroscopy. All of the collecting system was visualized and noted be free of any papillary lesions or evidence of any form of malignancy. There was debris and a few areas of cystitis cystica. I then backed the ureteroscope down the ureter under direct vision noting no abnormality throughout the course of the ureter.  Because of what appeared to be a possible filling defect in the distal left ureter I passed the fine tip rigid ureteroscope into the left ureteral orifice under direct visualization and noted no abnormality whatsoever of the distal ureter. I therefore removed this ureteroscope and inserted the cystoscope sheath with obturator. I removed the obturator and then drained the bladder. The cystoscope was then removed.  The bladder was then emptied and the procedure ended.  The patient appeared to tolerate the procedure well and without complications.  The patient was able to be awakened and transferred to the recovery unit in satisfactory condition. The patient received antibiotics yesterday and intravenous on antibiotics preoperatively. He will be maintained on  antibiotics postoperatively. Due to the ease of access and lack of the need for any form of ureteral dilatation I did not feel placement of a ureteral stent was indicated.

## 2014-12-03 NOTE — H&P (Signed)
Reason For Visit Keith Rivera is a 64 year old male patient of Dr. Everette Rank who was seen in office consultation today for further evaluation of gross hematuria and an incidental right renal lesion.   History of Present Illness He has a significant past urologic history in that he apparently had long-standing urinary retention with obstructive uropathy. It required bilateral percutaneous nephrostomy tubes to be placed and eventually internalized. He said he was told he had very tortuous ureters because of the long-standing retention and overfilled bladder. Eventually he underwent a transurethral resection of his prostate and he has been voiding well since then.         The patient has a known history of renal insufficiency with a creatinine of 3.04. He experienced gross hematuria and a renal ultrasound was obtained which revealed bilateral increased echogenicity consistent with medical renal disease as well as a possible solid lesion in the midportion of the right kidney. A follow-up MRI scan done on 11/30/14 revealed bilateral cortical atrophy more severe on the right, bilateral renal cysts, thickening of the urothelium on the left-hand side and a 2.4 cm lesion that appeared to be a hemorrhagic cyst in the midportion of the right kidney corresponding to the solid lesion noted on ultrasound. The radiologist commented that there was a possibility of hemorrhage into a solid lesion giving a similar appearance. I thought the lesion appeared worrisome for neoplasm.   He told me that his gross hematuria occurred 3 weeks ago. It lasted for 24 hours. It was not associated with flank pain. He has not seen any further hematuria. He has no prior history of gross hematuria. He does have a 40-pack-year smoking history. His right renal lesion would be considered of moderate severity with no modifying factors   Past Medical History Problems  1. History of hyperlipidemia (Z86.39)  Surgical History Problems  1.  History of Transurethral Resection Of Prostate (TURP)  Current Meds 1. Co Q 10 CAPS;  Therapy: (Recorded:04Feb2016) to Recorded 2. Lipitor 20 MG Oral Tablet;  Therapy: (Recorded:04Feb2016) to Recorded 3. Multi-Day Vitamins TABS;  Therapy: (Recorded:04Feb2016) to Recorded 4. Sodium Dichloroacetate Powder;  Therapy: (Recorded:04Feb2016) to Recorded 5. Vitamin D TABS;  Therapy: (Recorded:04Feb2016) to Recorded  Allergies Medication  1. No Known Drug Allergies  Family History Problems  1. No pertinent family history : Mother  Social History Problems    Alcohol use (Z78.9)   Caffeine use (F15.90)   Current every day smoker (F17.200)   Married   Number of children   Occupation  Review of Systems Genitourinary, constitutional, skin, eye, otolaryngeal, hematologic/lymphatic, cardiovascular, pulmonary, endocrine, musculoskeletal, gastrointestinal, neurological and psychiatric system(s) were reviewed and pertinent findings if present are noted and are otherwise negative.  Genitourinary: difficulty starting the urinary stream, erectile dysfunction and initiating urination requires straining.    Vitals Vital Signs  Height: 5 ft 11 in Weight: 170 lb  BMI Calculated: 23.71 BSA Calculated: 1.97 Blood Pressure: 132 / 86 Temperature: 97.4 F Heart Rate: 89  Physical Exam Constitutional: Well nourished and well developed . No acute distress.  ENT:. The ears and nose are normal in appearance.  Neck: The appearance of the neck is normal and no neck mass is present.  Pulmonary: No respiratory distress and normal respiratory rhythm and effort.  Cardiovascular: Heart rate and rhythm are normal . No peripheral edema.  Abdomen: The abdomen is soft and nontender. No masses are palpated. No CVA tenderness. No hernias are palpable. No hepatosplenomegaly noted.  Genitourinary: Examination of the penis demonstrates  no discharge, no masses, no lesions and a normal meatus. The penis is  circumcised. The scrotum is without lesions. The right epididymis is palpably normal and non-tender. The left epididymis is palpably normal and non-tender. The right testis is non-tender and without masses. The left testis is non-tender and without masses.  Lymphatics: The femoral and inguinal nodes are not enlarged or tender.  Skin: Normal skin turgor, no visible rash and no visible skin lesions.  Neuro/Psych:. Mood and affect are appropriate.    Results/Data Urine  COLOR YELLOW  APPEARANCE CLOUDY  SPECIFIC GRAVITY 1.015  pH 5.5  GLUCOSE NEG mg/dL BILIRUBIN NEG  KETONE NEG mg/dL BLOOD TRACE  PROTEIN TRACE mg/dL UROBILINOGEN 0.2 mg/dL NITRITE POS  LEUKOCYTE ESTERASE LARGE  SQUAMOUS EPITHELIAL/HPF RARE  WBC TNTC WBC/hpf RBC 3-6 RBC/hpf BACTERIA MANY  CRYSTALS NONE SEEN  CASTS NONE SEEN   Old records or history reviewed: Notes from Dr. Everette Rank.  The following images/tracing/specimen were independently visualized:  MRI scan as above.  The following clinical lab reports were reviewed:  UA.  The following radiology reports were reviewed: MRI scan.    Procedure  Procedure: Cystoscopy done yesterday  Indication: Hematuria.  Informed Consent: Risks, benefits, and potential adverse events were discussed and informed consent was obtained from the patient.  Prep: The patient was prepped with betadine.  Anesthesia:. Local anesthesia was administered intraurethrally with 2% lidocaine jelly.  Procedure Note:  Urethral meatus:. No abnormalities.  Anterior urethra: No abnormalities.  Prostatic urethra:. The lateral prostatic lobes were enlarged. The prostate did have a TUR defect.  Bladder: Visulization was obscured due to cloudy urine. The ureteral orifices were in the normal anatomic position bilaterally and had clear efflux of urine. A systematic survey of the bladder demonstrated no bladder tumors or stones. The mucosa was smooth without abnormalities. Examination of the bladder  demonstrated moderate trabeculation. The patient tolerated the procedure well.  Complications: None.    Assessment   I spoke to the patient regarding the renal mass. I explained the natural history of renal cancer and the way in which the stage, pathological features, patient health, age, life expectancy, and quality of life all affect the decision-making process, prognosis, and need for additional studies. I also explained how all of these things affect the recommended treatment strategies. We discussed the accuracy of cancer staging with the current methods of staging. We reviewed the possibility of the cancer being of a higher or lower stage. Today I presented all of the standard management options for renal cancer and their associated cancer control outcomes, impact on quality of life, likelihood of achieving goals, risks, complications, and benefits.     We discussed the gold standard for treatment of renal cancer is surgery. I also explained that surveillance would be tailored to the patient based upon the results of the pathology.    We discussed surgery and its different forms including open surgery, laparoscopic surgery, and robotic surgery. I described the risks which included, but are not limited to, bleeding, infection, allergic reaction, heart attack, stroke, pulmonary embolus, death, positioning injury, damage to contiguous structures, need for blood transfusion (and its risk of infection, transfusion reaction, anaphylactic reaction, and death), nerve injury, vascular injury, urinary leak, fistula, renal failure, and hernia. We also discussed that any laparoscopic or robotic surgery might need to be converted to an open surgery should difficulties be encountered. We discussed in detail the expectations of surgery with regard to cancer control, as well as expected post-operative recovery process. We  also discussed long term outcomes and risks.     We discussed other management options  and their risks, benefits. These include biopsy of the mass, thermal ablation, and embolization. We discussed that biopsy can be associated with false negatives, false positives, and risk of spread of the cancer as well as damage to the kidney and bleeding. I generally do not recommend biopsy of the mass unless we are concerned that the mass is a metastasis from another source or unless the patient is not a good surgical candidate. We discussed that thermal ablation is generally reserved for patients who are not good surgical candidates or wish to avoid surgery. We discuss that thermal ablation is inferior to surgery for cancer control. We discussed that embolization is reserved for patients that are not surgical candidates but are symptomatic from their cancer (such as bleeding or pain).    The patient was encouraged to ask questions throughout the discussion today and all questions were answered to the patient's stated satisfaction. The patient was able to ask pertinent questions showing an understanding of the situation.    At this point the way I told him we needed to proceed was to try to determine the source of his gross hematuria and make sure that the lesion in his right kidney is not a urothelial malignancy especially with his cigarette smoking history. I therefore have discussed evaluating this with a right retrograde pyelogram and ureteroscopy with possible biopsy. I will also perform a left retrograde pyelogram since his scan was done without contrast. His bladder was evaluated today but it was cloudy so this will also allow me to get a much better look at the bladder mucosa although I did not see any definite lesions within the bladder. We discussed the outpatient nature of the procedure as well as the probability of success and the very likely need for a temporary ureteral stent.    In addition I have recommended obtaining a renogram to determine the relative function of his kidneys as it  appears there is very little renal parenchyma present on the right hand side and if he were to undergo surgery he would likely need to undergo a nephrectomy.   Plan  1. Obtain renogram.  2. Schedule for outpatient cystoscopy, bilateral retrograde pyelography, right ureteroscopy with possible biopsy and stent.  3. He did have pyuria today so his urine was cultured. Abx. were started.

## 2014-12-06 ENCOUNTER — Encounter (HOSPITAL_COMMUNITY): Payer: Self-pay | Admitting: Urology

## 2014-12-09 ENCOUNTER — Ambulatory Visit (HOSPITAL_COMMUNITY)
Admission: RE | Admit: 2014-12-09 | Discharge: 2014-12-09 | Disposition: A | Discharge status: Home / Self Care | Payer: Managed Care, Other (non HMO) | Source: Ambulatory Visit | Attending: Urology | Admitting: Urology

## 2014-12-09 DIAGNOSIS — R310 Gross hematuria: Secondary | ICD-10-CM

## 2014-12-09 DIAGNOSIS — N289 Disorder of kidney and ureter, unspecified: Secondary | ICD-10-CM | POA: Insufficient documentation

## 2014-12-09 DIAGNOSIS — R3919 Other difficulties with micturition: Secondary | ICD-10-CM | POA: Insufficient documentation

## 2014-12-09 MED ORDER — TECHNETIUM TC 99M MERTIATIDE
15.6000 | Freq: Once | INTRAVENOUS | Status: AC | PRN
  Administered 2014-12-09: 16 via INTRAVENOUS

## 2015-01-03 ENCOUNTER — Ambulatory Visit (HOSPITAL_COMMUNITY)
Admission: RE | Admit: 2015-01-03 | Discharge: 2015-01-03 | Disposition: A | Discharge status: Home / Self Care | Payer: 59 | Source: Ambulatory Visit | Attending: Family Medicine | Admitting: Family Medicine

## 2015-01-03 ENCOUNTER — Other Ambulatory Visit (HOSPITAL_COMMUNITY): Payer: Self-pay | Admitting: Family Medicine

## 2015-01-03 DIAGNOSIS — F17200 Nicotine dependence, unspecified, uncomplicated: Secondary | ICD-10-CM | POA: Diagnosis not present

## 2015-01-03 DIAGNOSIS — Z0000 Encounter for general adult medical examination without abnormal findings: Secondary | ICD-10-CM | POA: Diagnosis present

## 2015-01-03 DIAGNOSIS — F17210 Nicotine dependence, cigarettes, uncomplicated: Secondary | ICD-10-CM

## 2015-01-04 ENCOUNTER — Encounter: Payer: Self-pay | Admitting: *Deleted

## 2015-01-05 ENCOUNTER — Other Ambulatory Visit: Payer: Self-pay | Admitting: Urology

## 2015-01-06 ENCOUNTER — Encounter: Payer: Self-pay | Admitting: Cardiology

## 2015-01-10 ENCOUNTER — Encounter: Payer: Self-pay | Admitting: *Deleted

## 2015-01-11 ENCOUNTER — Telehealth: Payer: Self-pay | Admitting: Internal Medicine

## 2015-01-12 ENCOUNTER — Encounter: Payer: Self-pay | Admitting: Cardiology

## 2015-01-12 ENCOUNTER — Ambulatory Visit (INDEPENDENT_AMBULATORY_CARE_PROVIDER_SITE_OTHER): Payer: 59 | Admitting: Cardiology

## 2015-01-12 VITALS — BP 120/84 | HR 81 | Ht 71.0 in | Wt 170.0 lb

## 2015-01-12 DIAGNOSIS — Z01810 Encounter for preprocedural cardiovascular examination: Secondary | ICD-10-CM

## 2015-01-12 DIAGNOSIS — N184 Chronic kidney disease, stage 4 (severe): Secondary | ICD-10-CM

## 2015-01-12 DIAGNOSIS — Z720 Tobacco use: Secondary | ICD-10-CM

## 2015-01-12 DIAGNOSIS — I341 Nonrheumatic mitral (valve) prolapse: Secondary | ICD-10-CM

## 2015-01-12 DIAGNOSIS — E782 Mixed hyperlipidemia: Secondary | ICD-10-CM

## 2015-01-12 DIAGNOSIS — Z01818 Encounter for other preprocedural examination: Secondary | ICD-10-CM

## 2015-01-12 NOTE — Patient Instructions (Signed)
Your physician recommends that you continue on your current medications as directed. Please refer to the Current Medication list given to you today. Your physician has requested that you have an echocardiogram. Echocardiography is a painless test that uses sound waves to create images of your heart. It provides your doctor with information about the size and shape of your heart and how well your heart's chambers and valves are working. This procedure takes approximately one hour. There are no restrictions for this procedure. We will call your with your results.

## 2015-01-12 NOTE — Progress Notes (Signed)
Cardiology Office Note  Date: 01/12/2015   ID: IFEANYI Rivera, DOB [birth year], MRN [id number]  PCP: Lanette Hampshire, MD  Primary Cardiologist: Rozann Lesches, MD   Chief Complaint  Patient presents with  . Preoperative evaluation    History of Present Illness: Keith Rivera is a 64 y.o. male referred for preoperative cardiac evaluation by Dr. Everette Rank. 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 Louis Meckel. He is being considered for a right laparoscopic radical nephrectomy under general anesthesia.  He has a previously documented history of mitral valve prolapse with mild mitral regurgitation and 2014. This has been asymptomatic. Otherwise, no history of ischemic heart disease, cardiomyopathy, or cardiac arrhythmias. 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 He is able to carry equipment, climb stairs, reports typically NYHA class II dyspnea, no exertional chest pain.  ECG is outlined below. His last echocardiogram was in 2014.  He is a smoker, but does not report any known history of COPD. He does not use inhalers. Most recent chest x-ray is noted below.    Past Medical History  Diagnosis Date  . Hyperlipidemia   . BPH (benign prostatic hyperplasia)     History of bladder outlet obstruction  . History of hydronephrosis   . Chronic kidney disease   . Anemia   . MVP (mitral valve prolapse)     Mild mitral regurgitation 2014    Past Surgical History  Procedure Laterality Date  . Cystoscopy with retrograde pyelogram, ureteroscopy and stent placement Bilateral 12/03/2014    Procedure: CYSTOSCOPY WITH BILATERAL RETROGRADE PYELOGRAM, BILATERAL  URETEROSCOPY ;  Surgeon: Claybon Jabs, MD;  Location: [facility];  Service: Urology;  Laterality: Bilateral;    . Transurethral resection of prostate    . Inguinal hernia repair Left   . Vasectomy    . Nephrostomy      Current Outpatient Prescriptions  Medication Sig Dispense Refill  . atorvastatin (LIPITOR) 20 MG tablet Take 20 mg by mouth daily.    . cholecalciferol (VITAMIN D) 1000 UNITS tablet Take 1,000 Units by mouth daily.    . Coenzyme Q10 (CO Q10 PO) Take 1 tablet by mouth daily.    . Multiple Vitamin (MULTIVITAMIN WITH MINERALS) TABS tablet Take 1 tablet by mouth daily.    . sodium bicarbonate 650 MG tablet Take 650 mg by mouth 2 (two) times daily.     No current facility-administered medications for this visit.    Allergies:  Review of patient's allergies indicates no known allergies.   Social History: The patient  reports that he has been smoking Cigarettes.  He has a 40 pack-year smoking history. He has never used smokeless tobacco. He reports that he drinks alcohol. He reports that he does not use illicit drugs.   Family History: The patient's family history includes Heart attack in his father.   ROS:  Please see the history of present illness. Otherwise, complete review of systems is positive for none.  All other systems are reviewed and negative.    Physical Exam: VS:  BP 120/84 mmHg  Pulse 81  Ht 5\' 11"  (1.803 m)  Wt 170 lb (77.111 kg)  BMI 23.72 kg/m2  SpO2 98%, BMI Body mass index is 23.72 kg/(m^2).  Wt Readings from Last 3  Encounters:  01/12/15 170 lb (77.111 kg)  12/03/14 174 lb 4 oz (79.039 kg)  06/14/08 170 lb (77.111 kg)     General: Patient appears comfortable at rest. HEENT: Conjunctiva and lids normal, oropharynx clear with moist mucosa. Neck: Supple, no elevated JVP or carotid bruits, no thyromegaly. Lungs: Clear to auscultation, nonlabored breathing at rest. Cardiac: Regular rate and rhythm, no S3, 1/6 apical systolic murmur, no pericardial rub. Abdomen: Soft, nontender, bowel sounds present, no guarding or rebound. Extremities: No pitting edema,  distal pulses 2+. Skin: Warm and dry. Musculoskeletal: No kyphosis. Neuropsychiatric: Alert and oriented x3, affect grossly appropriate.   ECG: ECG is ordered today showing normal sinus rhythm.   Recent Labwork:  Lab work from 01/03/2015 showed hemoglobin 16.1, platelets 172, potassium 4.2, BUN 25, creatinine 3.1, AST 24, ALT 25, cholesterol 157, triglycerides 124, HDL 50, LDL 82, TSH 1.2.  Other Studies Reviewed Today:  Chest x-ray 01/03/2015: CHEST - 2 VIEW  COMPARISON: CT 05/26/2013 and earlier studies  FINDINGS: Mild hyperinflation. No focal infiltrate. Heart size normal. Tortuous thoracic aorta. No effusion. Spurring in the mid thoracic spine.  IMPRESSION: 1. Stable hyperinflation without acute or superimposed abnormality.   Echocardiogram 01/01/2013: Study Conclusions  - Left ventricle: The cavity size was normal. Systolic function was normal. Wall motion was normal; there were no regional wall motion abnormalities. Doppler parameters are consistent with abnormal left ventricular relaxation (grade 1 diastolic dysfunction). - Mitral valve: Calcified annulus. Mildly thickened leaflets . Mild, late systolicprolapse, involving the anterior leaflet and the posterior leaflet. Mild regurgitation. Impressions:  - Large left lobe of the liver cystic structures are seen.   Assessment and Plan:  1. Preoperative cardiac evaluation prior to anticipated right laparoscopic radical nephrectomy in late April with Dr. Louis Meckel. ECG is nonspecific, he reports regular activities far exceeding 4 METs without exertional chest pain or shortness of breath beyond NYHA class II. Cardiac examination is fairly unremarkable with soft systolic murmur, no click, but previously documented mitral valve prolapse as a 2014. We will obtain a follow-up echocardiogram, but as long as there are no significant progressive valvular issues, I expect he should be able to proceed with planned  surgery at an acceptable perioperative cardiac risk.  2. Tobacco abuse. Smoking cessation discussed. Recent chest x-ray showed stable hyperinflation. Would be worth considering pulmonary function tests at some point, this can be deferred to his primary care provider Dr. Everette Rank.  3. Hyperlipidemia, on Lipitor with recent LDL 82.  4. Chronic kidney disease, stage 4, creatinine 3.1.  Current medicines are reviewed at length with the patient today.  The patient does not have concerns regarding medicines.    Orders Placed This Encounter  Procedures  . EKG 12-Lead  . 2D Echocardiogram without contrast    Disposition: Will call patient with results and also forwarded to Dr. Louis Meckel.   Signed, Satira Sark, MD, [facility] 01/12/2015 9:14 AM    [facility] [address] [place], [place], [place] [address] Phone: [phone]; Fax: [phone]

## 2015-01-19 ENCOUNTER — Telehealth: Payer: Self-pay | Admitting: *Deleted

## 2015-01-19 ENCOUNTER — Other Ambulatory Visit (INDEPENDENT_AMBULATORY_CARE_PROVIDER_SITE_OTHER): Payer: Managed Care, Other (non HMO)

## 2015-01-19 ENCOUNTER — Other Ambulatory Visit: Payer: Self-pay

## 2015-01-19 DIAGNOSIS — Z01810 Encounter for preprocedural cardiovascular examination: Secondary | ICD-10-CM | POA: Diagnosis not present

## 2015-01-19 DIAGNOSIS — I341 Nonrheumatic mitral (valve) prolapse: Secondary | ICD-10-CM

## 2015-01-19 NOTE — Telephone Encounter (Signed)
Pt made aware, forwarded to Dr. Everette Rank and Dr. Louis Meckel

## 2015-01-19 NOTE — Telephone Encounter (Signed)
-----   Message from Satira Sark, MD sent at 01/19/2015  2:41 PM EDT ----- Reviewed. LVEF is normal and there is only mild mitral valve prolapse associated with mild mitral regurgitation. In light of this and his recent clinical stability, he should be able to proceed with planned surgery without further cardiac testing. Note that he does not need antibiotic prophylaxis for his mitral valve prolapse. Please send amended copy of this report Dr. Everette Rank and Dr. Louis Meckel.

## 2015-01-26 ENCOUNTER — Encounter: Payer: Self-pay | Admitting: Nurse Practitioner

## 2015-01-26 ENCOUNTER — Ambulatory Visit (INDEPENDENT_AMBULATORY_CARE_PROVIDER_SITE_OTHER): Payer: Managed Care, Other (non HMO) | Admitting: Nurse Practitioner

## 2015-01-26 ENCOUNTER — Other Ambulatory Visit: Payer: Self-pay

## 2015-01-26 VITALS — BP 136/89 | HR 86 | Temp 97.3°F | Ht 71.0 in | Wt 172.2 lb

## 2015-01-26 DIAGNOSIS — Z86010 Personal history of colonic polyps: Secondary | ICD-10-CM

## 2015-01-26 MED ORDER — PEG 3350-KCL-NA BICARB-NACL 420 G PO SOLR: 4000.0000 mL | ORAL | Status: DC

## 2015-01-26 NOTE — Progress Notes (Signed)
CC'ED TO PCP 

## 2015-01-26 NOTE — Patient Instructions (Signed)
1. We will schedule procedure (colonoscopy) for you 2. Further recommendations to be made based on results are procedure.

## 2015-01-26 NOTE — Assessment & Plan Note (Addendum)
64 year old male with a history of adenomatous colon polyp on his last colonoscopy 6 years ago, which was his initial screening colonoscopy. Recommended repeat in 5 years. Patient is essentially a symptomatically today, denies any red flags last warning symptoms such as GI bleed, abdominal pain, fever, chills, unintentional weight loss. We'll proceed with this surveillance colonoscopy. The patient is not on any anticoagulants or diabetes medications.  Proceed with TCS with Dr. Gala Romney in near future: the risks, benefits, and alternatives have been discussed with the patient in detail. The patient states understanding and desires to proceed.

## 2015-01-26 NOTE — Progress Notes (Signed)
Primary Care Physician:  Lanette Hampshire, MD Primary Gastroenterologist:  Dr. Gala Romney  Chief Complaint  Patient presents with  . Colonoscopy    HPI:   64 year old male presents for office visit to determine the necessity of surveillance colonoscopy last colonoscopy done 05/14/2008 for initial screening knee absence of lower GI tract symptoms. Prep was adequate although there is a notation that a greasy material ended up on the lens which made visualization difficult however the mucosa was adequately seen. There is sigmoid diverticula, ampulla, and a diminutive polyp at the base of the cecum which was biopsied. Because otherwise normal. Pathology report shows a diminutive polyp was adenomatous polyp with no high-grade dysplasia or invasive malignancy. While no definitive recommendation for repeat was found in [facility], there is a letter mailed to the patient in 2014 recommending a repeat in 5 year repeat given these findings seems appropriate. At this point the patient is 1 year overdue for surveillance colonoscopy.  Today he states he is essentially asymptomatic. Denies abdominal pain, N/V, hematochezia, melena, fever, chills, unintentional weight loss, change in bowel habits, change in appetite. Denies any upper or lower GI symptoms.  Past Medical History  Diagnosis Date  . Hyperlipidemia   . BPH (benign prostatic hyperplasia)     History of bladder outlet obstruction  . History of hydronephrosis   . Chronic kidney disease   . Anemia   . MVP (mitral valve prolapse)     Mild mitral regurgitation 2014    Past Surgical History  Procedure Laterality Date  . Cystoscopy with retrograde pyelogram, ureteroscopy and stent placement Bilateral 12/03/2014    Procedure: CYSTOSCOPY WITH BILATERAL RETROGRADE PYELOGRAM, BILATERAL  URETEROSCOPY ;  Surgeon: Claybon Jabs, MD;  Location: [facility];  Service: Urology;  Laterality: Bilateral;  . Transurethral resection of prostate    . Inguinal hernia repair  Left   . Vasectomy    . Nephrostomy      Current Outpatient Prescriptions  Medication Sig Dispense Refill  . atorvastatin (LIPITOR) 20 MG tablet Take 20 mg by mouth daily.    . cholecalciferol (VITAMIN D) 1000 UNITS tablet Take 1,000 Units by mouth daily.    . Coenzyme Q10 (CO Q10 PO) Take 1 tablet by mouth daily.    . Multiple Vitamin (MULTIVITAMIN WITH MINERALS) TABS tablet Take 1 tablet by mouth daily.    . sodium bicarbonate 650 MG tablet Take 650 mg by mouth 2 (two) times daily.     No current facility-administered medications for this visit.    Allergies as of 01/26/2015  . (No Known Allergies)    Family History  Problem Relation Age of Onset  . Heart attack Father     Died age 32    History   Social History  . Marital Status: Married    Spouse Name: N/A  . Number of Children: N/A  . Years of Education: N/A   Occupational History  . Not on file.   Social History Main Topics  . Smoking status: Current Every Day Smoker -- 1.00 packs/day for 40 years    Types: Cigarettes  . Smokeless tobacco: Never Used  . Alcohol Use: 0.0 oz/week    0 Standard drinks or equivalent per week     Comment: Rare  . Drug Use: No  . Sexual Activity: Not on file   Other Topics Concern  . Not on file   Social History Narrative    Review of Systems: General: Negative for anorexia, weight loss, fever,  chills, fatigue, weakness. Eyes: Negative for vision changes.  ENT: Negative for hoarseness, difficulty swallowing , nasal congestion. CV: Negative for chest pain, angina, palpitations, dyspnea on exertion, peripheral edema.  Respiratory: Negative for dyspnea at rest, dyspnea on exertion, cough, sputum, wheezing.  GI: See history of present illness. GU:  Negative for dysuria, hematuria, urinary incontinence, urinary frequency, nocturnal urination.  MS: Negative for joint pain, low back pain.  Derm: Negative for rash or itching.  Neuro: Negative for weakness, abnormal sensation,  seizure, frequent headaches, memory loss, confusion.  Psych: Negative for anxiety, depression, suicidal ideation, hallucinations.  Endo: Negative for unusual weight change.  Heme: Negative for bruising or bleeding. Allergy: Negative for rash or hives.   Physical Exam: BP 136/89 mmHg  Pulse 86  Temp(Src) 97.3 F (36.3 C) (Oral)  Ht 5\' 11"  (1.803 m)  Wt 172 lb 3.2 oz (78.109 kg)  BMI 24.03 kg/m2 General:   Alert and oriented. Pleasant and cooperative. Well-nourished and well-developed.  Head:  Normocephalic and atraumatic. Eyes:  Without icterus, sclera clear and conjunctiva pink.  Ears:  Normal auditory acuity. Nose:  No deformity, discharge,  or lesions. Mouth:  No deformity or lesions, oral mucosa pink.  Neck:  Supple, without mass or thyromegaly. Lungs:  Clear to auscultation bilaterally. No wheezes, rales, or rhonchi. No distress.  Heart:  S1, S2 present without murmurs appreciated.  Abdomen:  +BS, soft, non-tender and non-distended. No HSM noted. No guarding or rebound. No masses appreciated.  Rectal:  Deferred  Msk:  Symmetrical without gross deformities. Normal posture. Pulses:  Normal pulses noted. Extremities:  Without clubbing or edema. Neurologic:  Alert and  oriented x4;  grossly normal neurologically. Skin:  Intact without significant lesions or rashes. Cervical Nodes:  No significant cervical adenopathy. Psych:  Alert and cooperative. Normal mood and affect.     01/26/2015 8:11 AM

## 2015-02-08 ENCOUNTER — Ambulatory Visit (HOSPITAL_COMMUNITY)
Admission: RE | Admit: 2015-02-08 | Discharge: 2015-02-08 | Disposition: A | Discharge status: Home / Self Care | Payer: Managed Care, Other (non HMO) | Source: Ambulatory Visit | Attending: Internal Medicine | Admitting: Internal Medicine

## 2015-02-08 ENCOUNTER — Encounter (HOSPITAL_COMMUNITY)
Admission: RE | Disposition: A | Discharge status: Home / Self Care | Payer: Self-pay | Source: Ambulatory Visit | Attending: Internal Medicine

## 2015-02-08 ENCOUNTER — Encounter (HOSPITAL_COMMUNITY): Payer: Self-pay | Admitting: *Deleted

## 2015-02-08 DIAGNOSIS — N400 Enlarged prostate without lower urinary tract symptoms: Secondary | ICD-10-CM | POA: Diagnosis not present

## 2015-02-08 DIAGNOSIS — Z9852 Vasectomy status: Secondary | ICD-10-CM | POA: Diagnosis not present

## 2015-02-08 DIAGNOSIS — K5730 Diverticulosis of large intestine without perforation or abscess without bleeding: Secondary | ICD-10-CM | POA: Diagnosis not present

## 2015-02-08 DIAGNOSIS — E785 Hyperlipidemia, unspecified: Secondary | ICD-10-CM | POA: Diagnosis not present

## 2015-02-08 DIAGNOSIS — D649 Anemia, unspecified: Secondary | ICD-10-CM | POA: Insufficient documentation

## 2015-02-08 DIAGNOSIS — N189 Chronic kidney disease, unspecified: Secondary | ICD-10-CM | POA: Diagnosis not present

## 2015-02-08 DIAGNOSIS — D125 Benign neoplasm of sigmoid colon: Secondary | ICD-10-CM

## 2015-02-08 DIAGNOSIS — Z86010 Personal history of colonic polyps: Secondary | ICD-10-CM | POA: Diagnosis not present

## 2015-02-08 DIAGNOSIS — F17210 Nicotine dependence, cigarettes, uncomplicated: Secondary | ICD-10-CM | POA: Diagnosis not present

## 2015-02-08 DIAGNOSIS — D123 Benign neoplasm of transverse colon: Secondary | ICD-10-CM | POA: Insufficient documentation

## 2015-02-08 HISTORY — PX: COLONOSCOPY: SHX5424

## 2015-02-08 SURGERY — COLONOSCOPY
Anesthesia: Moderate Sedation

## 2015-02-08 MED ORDER — MEPERIDINE HCL 100 MG/ML IJ SOLN
INTRAMUSCULAR | Status: DC | PRN
  Administered 2015-02-08 (×2): 25 mg via INTRAVENOUS
  Administered 2015-02-08: 50 mg via INTRAVENOUS

## 2015-02-08 MED ORDER — ONDANSETRON HCL 4 MG/2ML IJ SOLN
INTRAMUSCULAR | Status: AC
  Filled 2015-02-08: qty 2

## 2015-02-08 MED ORDER — STERILE WATER FOR IRRIGATION IR SOLN
Status: DC | PRN
  Administered 2015-02-08: 14:00:00

## 2015-02-08 MED ORDER — ONDANSETRON HCL 4 MG/2ML IJ SOLN
INTRAMUSCULAR | Status: DC | PRN
  Administered 2015-02-08: 4 mg via INTRAVENOUS

## 2015-02-08 MED ORDER — MIDAZOLAM HCL 5 MG/5ML IJ SOLN
INTRAMUSCULAR | Status: DC | PRN
  Administered 2015-02-08 (×2): 2 mg via INTRAVENOUS
  Administered 2015-02-08 (×2): 1 mg via INTRAVENOUS

## 2015-02-08 MED ORDER — MEPERIDINE HCL 100 MG/ML IJ SOLN
INTRAMUSCULAR | Status: AC
  Filled 2015-02-08: qty 2

## 2015-02-08 MED ORDER — MIDAZOLAM HCL 5 MG/5ML IJ SOLN
INTRAMUSCULAR | Status: AC
  Filled 2015-02-08: qty 10

## 2015-02-08 MED ORDER — SODIUM CHLORIDE 0.9 % IV SOLN
INTRAVENOUS | Status: DC
  Administered 2015-02-08: 13:00:00 via INTRAVENOUS

## 2015-02-08 NOTE — Interval H&P Note (Signed)
History and Physical Interval Note:  02/08/2015 1:33 PM  Keith Rivera  has presented today for surgery, with the diagnosis of HISTORY OF POLPYS  The various methods of treatment have been discussed with the patient and family. After consideration of risks, benefits and other options for treatment, the patient has consented to  Procedure(s) with comments: COLONOSCOPY (N/A) - 200 as a surgical intervention .  The patient's history has been reviewed, patient examined, no change in status, stable for surgery.  I have reviewed the patient's chart and labs.  Questions were answered to the patient's satisfaction.     Keith Rivera  No change. Surveillance colonoscopy per plan.  The risks, benefits, limitations, alternatives and imponderables have been reviewed with the patient. Questions have been answered. All parties are agreeable.

## 2015-02-08 NOTE — Discharge Instructions (Signed)
°Colonoscopy °Discharge Instructions ° °Read the instructions outlined below and refer to this sheet in the next few weeks. These discharge instructions provide you with general information on caring for yourself after you leave the hospital. Your doctor may also give you specific instructions. While your treatment has been planned according to the most current medical practices available, unavoidable complications occasionally occur. If you have any problems or questions after discharge, call Dr. Rourk at [phone]. °ACTIVITY °· You may resume your regular activity, but move at a slower pace for the next 24 hours.  °· Take frequent rest periods for the next 24 hours.  °· Walking will help get rid of the air and reduce the bloated feeling in your belly (abdomen).  °· No driving for 24 hours (because of the medicine (anesthesia) used during the test).   °· Do not sign any important legal documents or operate any machinery for 24 hours (because of the anesthesia used during the test).  °NUTRITION °· Drink plenty of fluids.  °· You may resume your normal diet as instructed by your doctor.  °· Begin with a light meal and progress to your normal diet. Heavy or fried foods are harder to digest and may make you feel sick to your stomach (nauseated).  °· Avoid alcoholic beverages for 24 hours or as instructed.  °MEDICATIONS °· You may resume your normal medications unless your doctor tells you otherwise.  °WHAT YOU CAN EXPECT TODAY °· Some feelings of bloating in the abdomen.  °· Passage of more gas than usual.  °· Spotting of blood in your stool or on the toilet paper.  °IF YOU HAD POLYPS REMOVED DURING THE COLONOSCOPY: °· No aspirin products for 7 days or as instructed.  °· No alcohol for 7 days or as instructed.  °· Eat a soft diet for the next 24 hours.  °FINDING OUT THE RESULTS OF YOUR TEST °Not all test results are available during your visit. If your test results are not back during the visit, make an appointment  with your caregiver to find out the results. Do not assume everything is normal if you have not heard from your caregiver or the medical facility. It is important for you to follow up on all of your test results.  °SEEK IMMEDIATE MEDICAL ATTENTION IF: °· You have more than a spotting of blood in your stool.  °· Your belly is swollen (abdominal distention).  °· You are nauseated or vomiting.  °· You have a temperature over 101.  °· You have abdominal pain or discomfort that is severe or gets worse throughout the day.  ° °Polyp and diverticulosis information provided ° °Further recommendations to follow pending review of pathology report ° °Colon Polyps °Polyps are lumps of extra tissue growing inside the body. Polyps can grow in the large intestine (colon). Most colon polyps are noncancerous (benign). However, some colon polyps can become cancerous over time. Polyps that are larger than a pea may be harmful. To be safe, caregivers remove and test all polyps. °CAUSES  °Polyps form when mutations in the genes cause your cells to grow and divide even though no more tissue is needed. °RISK FACTORS °There are a number of risk factors that can increase your chances of getting colon polyps. They include: °· Being older than 50 years. °· Family history of colon polyps or colon cancer. °· Long-term colon diseases, such as colitis or Crohn disease. °· Being overweight. °· Smoking. °· Being inactive. °· Drinking too much alcohol. °  SYMPTOMS  °Most small polyps do not cause symptoms. If symptoms are present, they may include: °· Blood in the stool. The stool may look dark red or black. °· Constipation or diarrhea that lasts longer than 1 week. °DIAGNOSIS °People often do not know they have polyps until their caregiver finds them during a regular checkup. Your caregiver can use 4 tests to check for polyps: °· Digital rectal exam. The caregiver wears gloves and feels inside the rectum. This test would find polyps only in the  rectum. °· Barium enema. The caregiver puts a liquid called barium into your rectum before taking X-rays of your colon. Barium makes your colon look white. Polyps are dark, so they are easy to see in the X-ray pictures. °· Sigmoidoscopy. A thin, flexible tube (sigmoidoscope) is placed into your rectum. The sigmoidoscope has a light and tiny camera in it. The caregiver uses the sigmoidoscope to look at the last third of your colon. °· Colonoscopy. This test is like sigmoidoscopy, but the caregiver looks at the entire colon. This is the most common method for finding and removing polyps. °TREATMENT  °Any polyps will be removed during a sigmoidoscopy or colonoscopy. The polyps are then tested for cancer. °PREVENTION  °To help lower your risk of getting more colon polyps: °· Eat plenty of fruits and vegetables. Avoid eating fatty foods. °· Do not smoke. °· Avoid drinking alcohol. °· Exercise every day. °· Lose weight if recommended by your caregiver. °· Eat plenty of calcium and folate. Foods that are rich in calcium include milk, cheese, and broccoli. Foods that are rich in folate include chickpeas, kidney beans, and spinach. °HOME CARE INSTRUCTIONS °Keep all follow-up appointments as directed by your caregiver. You may need periodic exams to check for polyps. °SEEK MEDICAL CARE IF: °You notice bleeding during a bowel movement. °Document Released: 07/11/2004 Document Revised: 01/07/2012 Document Reviewed: 12/25/2011 °Exit[employer]® Patient Information ©2015 [employer]. This information is not intended to replace advice given to you by your health care provider. Make sure you discuss any questions you have with your health care provider. ° °Diverticulosis °Diverticulosis is the condition that develops when small pouches (diverticula) form in the wall of your colon. Your colon, or large intestine, is where water is absorbed and stool is formed. The pouches form when the inside layer of your colon pushes through weak spots  in the outer layers of your colon. °CAUSES  °No one knows exactly what causes diverticulosis. °RISK FACTORS °· Being older than 50. Your risk for this condition increases with age. Diverticulosis is rare in people younger than 40 years. By age 80, almost everyone has it. °· Eating a low-fiber diet. °· Being frequently constipated. °· Being overweight. °· Not getting enough exercise. °· Smoking. °· Taking over-the-counter pain medicines, like aspirin and ibuprofen. °SYMPTOMS  °Most people with diverticulosis do not have symptoms. °DIAGNOSIS  °Because diverticulosis often has no symptoms, health care providers often discover the condition during an exam for other colon problems. In many cases, a health care provider will diagnose diverticulosis while using a flexible scope to examine the colon (colonoscopy). °TREATMENT  °If you have never developed an infection related to diverticulosis, you may not need treatment. If you have had an infection before, treatment may include: °· Eating more fruits, vegetables, and grains. °· Taking a fiber supplement. °· Taking a live bacteria supplement (probiotic). °· Taking medicine to relax your colon. °HOME CARE INSTRUCTIONS  °· Drink at least 6-8 glasses of water each day   to prevent constipation. °· Try not to strain when you have a bowel movement. °· Keep all follow-up appointments. °If you have had an infection before:  °· Increase the fiber in your diet as directed by your health care provider or dietitian. °· Take a dietary fiber supplement if your health care provider approves. °· Only take medicines as directed by your health care provider. °SEEK MEDICAL CARE IF:  °· You have abdominal pain. °· You have bloating. °· You have cramps. °· You have not gone to the bathroom in 3 days. °SEEK IMMEDIATE MEDICAL CARE IF:  °· Your pain gets worse. °· Your bloating becomes very bad. °· You have a fever or chills, and your symptoms suddenly get worse. °· You begin vomiting. °· You have  bowel movements that are bloody or black. °MAKE SURE YOU: °· Understand these instructions. °· Will watch your condition. °· Will get help right away if you are not doing well or get worse. °Document Released: 07/12/2004 Document Revised: 10/20/2013 Document Reviewed: 09/09/2013 °ExitCare® Patient Information ©2015 [employer]. This information is not intended to replace advice given to you by your health care provider. Make sure you discuss any questions you have with your health care provider. ° ° °

## 2015-02-08 NOTE — H&P (View-Only) (Signed)
Primary Care Physician:  Lanette Hampshire, MD Primary Gastroenterologist:  Dr. Gala Romney  Chief Complaint  Patient presents with  . Colonoscopy    HPI:   64 year old male presents for office visit to determine the necessity of surveillance colonoscopy last colonoscopy done 05/14/2008 for initial screening knee absence of lower GI tract symptoms. Prep was adequate although there is a notation that a greasy material ended up on the lens which made visualization difficult however the mucosa was adequately seen. There is sigmoid diverticula, ampulla, and a diminutive polyp at the base of the cecum which was biopsied. Because otherwise normal. Pathology report shows a diminutive polyp was adenomatous polyp with no high-grade dysplasia or invasive malignancy. While no definitive recommendation for repeat was found in [facility], there is a letter mailed to the patient in 2014 recommending a repeat in 5 year repeat given these findings seems appropriate. At this point the patient is 1 year overdue for surveillance colonoscopy.  Today he states he is essentially asymptomatic. Denies abdominal pain, N/V, hematochezia, melena, fever, chills, unintentional weight loss, change in bowel habits, change in appetite. Denies any upper or lower GI symptoms.  Past Medical History  Diagnosis Date  . Hyperlipidemia   . BPH (benign prostatic hyperplasia)     History of bladder outlet obstruction  . History of hydronephrosis   . Chronic kidney disease   . Anemia   . MVP (mitral valve prolapse)     Mild mitral regurgitation 2014    Past Surgical History  Procedure Laterality Date  . Cystoscopy with retrograde pyelogram, ureteroscopy and stent placement Bilateral 12/03/2014    Procedure: CYSTOSCOPY WITH BILATERAL RETROGRADE PYELOGRAM, BILATERAL  URETEROSCOPY ;  Surgeon: Claybon Jabs, MD;  Location: [facility];  Service: Urology;  Laterality: Bilateral;  . Transurethral resection of prostate    . Inguinal hernia repair  Left   . Vasectomy    . Nephrostomy      Current Outpatient Prescriptions  Medication Sig Dispense Refill  . atorvastatin (LIPITOR) 20 MG tablet Take 20 mg by mouth daily.    . cholecalciferol (VITAMIN D) 1000 UNITS tablet Take 1,000 Units by mouth daily.    . Coenzyme Q10 (CO Q10 PO) Take 1 tablet by mouth daily.    . Multiple Vitamin (MULTIVITAMIN WITH MINERALS) TABS tablet Take 1 tablet by mouth daily.    . sodium bicarbonate 650 MG tablet Take 650 mg by mouth 2 (two) times daily.     No current facility-administered medications for this visit.    Allergies as of 01/26/2015  . (No Known Allergies)    Family History  Problem Relation Age of Onset  . Heart attack Father     Died age 75    History   Social History  . Marital Status: Married    Spouse Name: N/A  . Number of Children: N/A  . Years of Education: N/A   Occupational History  . Not on file.   Social History Main Topics  . Smoking status: Current Every Day Smoker -- 1.00 packs/day for 40 years    Types: Cigarettes  . Smokeless tobacco: Never Used  . Alcohol Use: 0.0 oz/week    0 Standard drinks or equivalent per week     Comment: Rare  . Drug Use: No  . Sexual Activity: Not on file   Other Topics Concern  . Not on file   Social History Narrative    Review of Systems: General: Negative for anorexia, weight loss, fever,  chills, fatigue, weakness. Eyes: Negative for vision changes.  ENT: Negative for hoarseness, difficulty swallowing , nasal congestion. CV: Negative for chest pain, angina, palpitations, dyspnea on exertion, peripheral edema.  Respiratory: Negative for dyspnea at rest, dyspnea on exertion, cough, sputum, wheezing.  GI: See history of present illness. GU:  Negative for dysuria, hematuria, urinary incontinence, urinary frequency, nocturnal urination.  MS: Negative for joint pain, low back pain.  Derm: Negative for rash or itching.  Neuro: Negative for weakness, abnormal sensation,  seizure, frequent headaches, memory loss, confusion.  Psych: Negative for anxiety, depression, suicidal ideation, hallucinations.  Endo: Negative for unusual weight change.  Heme: Negative for bruising or bleeding. Allergy: Negative for rash or hives.   Physical Exam: BP 136/89 mmHg  Pulse 86  Temp(Src) 97.3 F (36.3 C) (Oral)  Ht 5\' 11"  (1.803 m)  Wt 172 lb 3.2 oz (78.109 kg)  BMI 24.03 kg/m2 General:   Alert and oriented. Pleasant and cooperative. Well-nourished and well-developed.  Head:  Normocephalic and atraumatic. Eyes:  Without icterus, sclera clear and conjunctiva pink.  Ears:  Normal auditory acuity. Nose:  No deformity, discharge,  or lesions. Mouth:  No deformity or lesions, oral mucosa pink.  Neck:  Supple, without mass or thyromegaly. Lungs:  Clear to auscultation bilaterally. No wheezes, rales, or rhonchi. No distress.  Heart:  S1, S2 present without murmurs appreciated.  Abdomen:  +BS, soft, non-tender and non-distended. No HSM noted. No guarding or rebound. No masses appreciated.  Rectal:  Deferred  Msk:  Symmetrical without gross deformities. Normal posture. Pulses:  Normal pulses noted. Extremities:  Without clubbing or edema. Neurologic:  Alert and  oriented x4;  grossly normal neurologically. Skin:  Intact without significant lesions or rashes. Cervical Nodes:  No significant cervical adenopathy. Psych:  Alert and cooperative. Normal mood and affect.     01/26/2015 8:11 AM

## 2015-02-08 NOTE — Op Note (Signed)
[facility] [address] [place], [address]   COLONOSCOPY PROCEDURE REPORT  PATIENT: Keith, Rivera  MR#: [id number] BIRTHDATE: 10-23-1951 , 70  yrs. old GENDER: male ENDOSCOPIST: R.  Garfield Cornea, MD FACP [facility] REFERRED [id number] Everette Rank, M.D. PROCEDURE DATE:  Mar 05, 2015 PROCEDURE:   Colonoscopy with snare polypectomy INDICATIONS:History of colonic adenoma; surveillance examination. MEDICATIONS: Versed 6 mg IV and Demerol 100 mg IV in divided doses. Zofran 4 mg IV. ASA CLASS:       Class II  CONSENT: The risks, benefits, alternatives and imponderables including but not limited to bleeding, perforation as well as the possibility of a missed lesion have been reviewed.  The potential for biopsy, lesion removal, etc. have also been discussed. Questions have been answered.  All parties agreeable.  Please see the history and physical in the medical record for more information.  DESCRIPTION OF PROCEDURE:   After the risks benefits and alternatives of the procedure were thoroughly explained, informed consent was obtained.  The digital rectal exam revealed no abnormalities of the rectum.   The [id number]  endoscope was introduced through the anus and advanced to the cecum, which was identified by both the appendix and ileocecal valve. No adverse events experienced.   The quality of the prep was adequate  The instrument was then slowly withdrawn as the colon was fully examined.      COLON FINDINGS: Patient was noted to have scattered left-sided diverticula; the patient had (1) 4 mm polyp at the splenic flexure and a 1.25 cm angry pedunculated polyp on a long stalk in the mid sigmoid segment; otherwise, the remainder of the colonic mucosa as well as the rectal mucosa appeared normal.  The above-mentioned polyps removed with hot and cold snare technique, respectively.  Retroflexion was performed. .  Withdrawal time=21 minutes 0 seconds.  The scope was  withdrawn and the procedure completed. COMPLICATIONS: There were no immediate complications.  ENDOSCOPIC IMPRESSION: Multiple colonic polyps?"removed as described above. Colonic diverticulosis.  RECOMMENDATIONS: Follow up on pathology.  eSigned:  R. Garfield Cornea, MD [facility] 03/05/2015 2:24 PM   cc:  CPT CODES: ICD CODES:  The ICD and CPT codes recommended by this software are interpretations from the data that the clinical staff has captured with the software.  The verification of the translation of this report to the ICD and CPT codes and modifiers is the sole responsibility of the health care institution and practicing physician where this report was generated.  [employer] will not be held responsible for the validity of the ICD and CPT codes included on this report.  AMA assumes no liability for data contained or not contained herein. CPT is a Designer, television/film set of the [employer].  PATIENT NAME:  Keith, Rivera MR#: [id number]

## 2015-02-09 ENCOUNTER — Encounter (HOSPITAL_COMMUNITY): Payer: Self-pay | Admitting: Internal Medicine

## 2015-02-10 ENCOUNTER — Encounter: Payer: Self-pay | Admitting: Internal Medicine

## 2015-02-14 NOTE — Patient Instructions (Addendum)
YOUR PROCEDURE IS SCHEDULED ON : 02/18/15  REPORT TO [facility] MAIN ENTRANCE FOLLOW SIGNS TO SHORT STAY CENTER AT :  5:30 AM  CALL THIS NUMBER IF YOU HAVE PROBLEMS THE MORNING OF SURGERY [phone]  REMEMBER:  DO NOT EAT FOOD OR DRINK LIQUIDS AFTER MIDNIGHT  TAKE THESE MEDICINES THE MORNING OF SURGERY:SODIUM BICARB  YOU MAY NOT HAVE ANY METAL ON YOUR BODY INCLUDING HAIR PINS AND PIERCING'S. DO NOT WEAR JEWELRY, MAKEUP, LOTIONS, POWDERS OR PERFUMES. DO NOT WEAR NAIL POLISH. DO NOT SHAVE 48 HRS PRIOR TO SURGERY. MEN MAY SHAVE FACE AND NECK.  DO NOT [place]. [facility] IS NOT RESPONSIBLE FOR VALUABLES.  CONTACTS, DENTURES OR PARTIALS MAY NOT BE WORN TO SURGERY. LEAVE SUITCASE IN CAR. CAN BE BROUGHT TO ROOM AFTER SURGERY.  PATIENTS DISCHARGED THE DAY OF SURGERY WILL NOT BE ALLOWED TO DRIVE HOME.  PLEASE READ OVER THE FOLLOWING INSTRUCTION SHEETS _________________________________________________________________________________                                          [facility] - PREPARING FOR SURGERY  Before surgery, you can play an important role.  Because skin is not sterile, your skin needs to be as free of germs as possible.  You can reduce the number of germs on your skin by washing with CHG (chlorahexidine gluconate) soap before surgery.  CHG is an antiseptic cleaner which kills germs and bonds with the skin to continue killing germs even after washing. Please DO NOT use if you have an allergy to CHG or antibacterial soaps.  If your skin becomes reddened/irritated stop using the CHG and inform your nurse when you arrive at Short Stay. Do not shave (including legs and underarms) for at least 48 hours prior to the first CHG shower.  You may shave your face. Please follow these instructions carefully:   1.  Shower with CHG Soap the night before surgery and the  morning of Surgery.   2.  If you choose to wash your hair, wash your hair  first as usual with your  normal  Shampoo.   3.  After you shampoo, rinse your hair and body thoroughly to remove the  shampoo.                                         4.  Use CHG as you would any other liquid soap.  You can apply chg directly  to the skin and wash . Gently wash with scrungie or clean wascloth    5.  Apply the CHG Soap to your body ONLY FROM THE NECK DOWN.   Do not use on open                           Wound or open sores. Avoid contact with eyes, ears mouth and genitals (private parts).                        Genitals (private parts) with your normal soap.              6.  Wash thoroughly, paying special attention to the area where your surgery  will be performed.   7.  Thoroughly rinse your body with warm water from the neck down.   8.  DO NOT shower/wash with your normal soap after using and rinsing off  the CHG Soap .                9.  Pat yourself dry with a clean towel.             10.  Wear clean night clothes to bed after shower             11.  Place clean sheets on your bed the night of your first shower and do not  sleep with pets.  Day of Surgery : Do not apply any lotions/deodorants the morning of surgery.  Please wear clean clothes to the hospital/surgery center.  FAILURE TO FOLLOW THESE INSTRUCTIONS MAY RESULT IN THE CANCELLATION OF YOUR SURGERY    PATIENT SIGNATURE_________________________________  ______________________________________________________________________    WHAT IS A BLOOD TRANSFUSION? Blood Transfusion Information  A transfusion is the replacement of blood or some of its parts. Blood is made up of multiple cells which provide different functions.  Red blood cells carry oxygen and are used for blood loss replacement.  White blood cells fight against infection.  Platelets control bleeding.  Plasma helps clot blood.  Other blood products are available for specialized needs, such as hemophilia or other clotting  disorders. BEFORE THE TRANSFUSION  Who gives blood for transfusions?   Healthy volunteers who are fully evaluated to make sure their blood is safe. This is blood bank blood. Transfusion therapy is the safest it has ever been in the practice of medicine. Before blood is taken from a donor, a complete history is taken to make sure that person has no history of diseases nor engages in risky social behavior (examples are intravenous drug use or sexual activity with multiple partners). The donor's travel history is screened to minimize risk of transmitting infections, such as malaria. The donated blood is tested for signs of infectious diseases, such as HIV and hepatitis. The blood is then tested to be sure it is compatible with you in order to minimize the chance of a transfusion reaction. If you or a relative donates blood, this is often done in anticipation of surgery and is not appropriate for emergency situations. It takes many days to process the donated blood. RISKS AND COMPLICATIONS Although transfusion therapy is very safe and saves many lives, the main dangers of transfusion include:   Getting an infectious disease.  Developing a transfusion reaction. This is an allergic reaction to something in the blood you were given. Every precaution is taken to prevent this. The decision to have a blood transfusion has been considered carefully by your caregiver before blood is given. Blood is not given unless the benefits outweigh the risks. AFTER THE TRANSFUSION  Right after receiving a blood transfusion, you will usually feel much better and more energetic. This is especially true if your red blood cells have gotten low (anemic). The transfusion raises the level of the red blood cells which carry oxygen, and this usually causes an energy increase.  The nurse administering the transfusion will monitor you carefully for complications. HOME CARE INSTRUCTIONS  No special instructions are needed after a  transfusion. You may find your energy is better. Speak with your caregiver about any limitations on activity for underlying diseases you may have. SEEK MEDICAL CARE IF:   Your condition is not improving after your transfusion.  You develop redness or irritation at  the intravenous (IV) site. SEEK IMMEDIATE MEDICAL CARE IF:  Any of the following symptoms occur over the next 12 hours:  Shaking chills.  You have a temperature by mouth above 102 F (38.9 C), not controlled by medicine.  Chest, back, or muscle pain.  People around you feel you are not acting correctly or are confused.  Shortness of breath or difficulty breathing.  Dizziness and fainting.  You get a rash or develop hives.  You have a decrease in urine output.  Your urine turns a dark color or changes to pink, red, or brown. Any of the following symptoms occur over the next 10 days:  You have a temperature by mouth above 102 F (38.9 C), not controlled by medicine.  Shortness of breath.  Weakness after normal activity.  The white part of the eye turns yellow (jaundice).  You have a decrease in the amount of urine or are urinating less often.  Your urine turns a dark color or changes to pink, red, or brown. Document Released: 10/12/2000 Document Revised: 01/07/2012 Document Reviewed: 05/31/2008 [facility] Patient Information 2014 [place], [place].  _______________________________________________________________________

## 2015-02-15 ENCOUNTER — Encounter (HOSPITAL_COMMUNITY)
Admission: RE | Admit: 2015-02-15 | Discharge: 2015-02-15 | Disposition: A | Discharge status: Home / Self Care | Payer: Managed Care, Other (non HMO) | Source: Ambulatory Visit | Attending: Urology | Admitting: Urology

## 2015-02-15 ENCOUNTER — Encounter (HOSPITAL_COMMUNITY): Payer: Self-pay

## 2015-02-15 DIAGNOSIS — N2889 Other specified disorders of kidney and ureter: Secondary | ICD-10-CM

## 2015-02-15 DIAGNOSIS — Z01818 Encounter for other preprocedural examination: Secondary | ICD-10-CM | POA: Insufficient documentation

## 2015-02-15 LAB — COMPREHENSIVE METABOLIC PANEL
ALT: 24 U/L (ref 0–53)
ANION GAP: 11 (ref 5–15)
AST: 29 U/L (ref 0–37)
Albumin: 4.6 g/dL (ref 3.5–5.2)
Alkaline Phosphatase: 73 U/L (ref 39–117)
BUN: 25 mg/dL — AB (ref 6–23)
CALCIUM: 9.4 mg/dL (ref 8.4–10.5)
CO2: 22 mmol/L (ref 19–32)
CREATININE: 2.92 mg/dL — AB (ref 0.50–1.35)
Chloride: 106 mmol/L (ref 96–112)
GFR, EST AFRICAN AMERICAN: 25 mL/min — AB (ref >90)
GFR, EST NON AFRICAN AMERICAN: 21 mL/min — AB (ref >90)
Glucose, Bld: 86 mg/dL (ref 70–99)
Potassium: 4.4 mmol/L (ref 3.5–5.1)
Sodium: 139 mmol/L (ref 135–145)
Total Bilirubin: 1.3 mg/dL — ABNORMAL HIGH (ref 0.3–1.2)
Total Protein: 7.6 g/dL (ref 6.0–8.3)

## 2015-02-15 LAB — CBC
HCT: 49.3 % (ref 39.0–52.0)
Hemoglobin: 16.6 g/dL (ref 13.0–17.0)
MCH: 31.9 pg (ref 26.0–34.0)
MCHC: 33.7 g/dL (ref 30.0–36.0)
MCV: 94.6 fL (ref 78.0–100.0)
Platelets: 186 10*3/uL (ref 150–400)
RBC: 5.21 MIL/uL (ref 4.22–5.81)
RDW: 14.3 % (ref 11.5–15.5)
WBC: 12 10*3/uL — ABNORMAL HIGH (ref 4.0–10.5)

## 2015-02-15 LAB — ABO/RH: ABO/RH(D): O POS

## 2015-02-15 NOTE — Progress Notes (Signed)
   02/15/15 1008  OBSTRUCTIVE SLEEP APNEA  Have you ever been diagnosed with sleep apnea through a sleep study? No  Do you snore loudly (loud enough to be heard through closed doors)?  1  Do you often feel tired, fatigued, or sleepy during the daytime? 0  Has anyone observed you stop breathing during your sleep? 1  Do you have, or are you being treated for high blood pressure? 0  BMI more than 35 kg/m2? 0  Age over 65 years old? 1  Neck circumference greater than 40 cm/16 inches? 0  Gender: 1

## 2015-02-15 NOTE — Progress Notes (Signed)
CBC / CMET faxed to Dr. Louis Meckel

## 2015-02-16 LAB — URINE CULTURE
COLONY COUNT: NO GROWTH
Culture: NO GROWTH

## 2015-02-17 NOTE — Anesthesia Preprocedure Evaluation (Addendum)
Anesthesia Evaluation  Patient identified by MRN, date of birth, ID band Patient awake    Reviewed: Allergy & Precautions, H&P , NPO status , Patient's Chart, lab work & pertinent test results  Airway Mallampati: II  TM Distance: >3 FB Neck ROM: full    Dental  (+) Caps, Dental Advisory Given Right upper front lateral is bridged in:   Pulmonary Current Smoker,  breath sounds clear to auscultation  Pulmonary exam normal       Cardiovascular Exercise Tolerance: Good negative cardio ROS  Rhythm:regular Rate:Normal     Neuro/Psych negative neurological ROS  negative psych ROS   GI/Hepatic negative GI ROS, Neg liver ROS,   Endo/Other  negative endocrine ROS  Renal/GU negative Renal ROS  negative genitourinary   Musculoskeletal   Abdominal   Peds  Hematology negative hematology ROS (+)   Anesthesia Other Findings   Reproductive/Obstetrics negative OB ROS                            Anesthesia Physical Anesthesia Plan  ASA: II  Anesthesia Plan: General   Post-op Pain Management:    Induction: Intravenous  Airway Management Planned: Oral ETT  Additional Equipment:   Intra-op Plan:   Post-operative Plan: Extubation in OR  Informed Consent:   Plan Discussed with: Surgeon  Anesthesia Plan Comments:         Anesthesia Quick Evaluation

## 2015-02-18 ENCOUNTER — Inpatient Hospital Stay (HOSPITAL_COMMUNITY)
Admission: RE | Admit: 2015-02-18 | Discharge: 2015-02-21 | DRG: 658 | Disposition: A | Discharge status: Home / Self Care | Payer: Managed Care, Other (non HMO) | Source: Ambulatory Visit | Attending: Urology | Admitting: Urology

## 2015-02-18 ENCOUNTER — Inpatient Hospital Stay (HOSPITAL_COMMUNITY): Payer: Managed Care, Other (non HMO) | Admitting: Anesthesiology

## 2015-02-18 ENCOUNTER — Encounter (HOSPITAL_COMMUNITY): Payer: Self-pay | Admitting: *Deleted

## 2015-02-18 ENCOUNTER — Encounter (HOSPITAL_COMMUNITY)
Admission: RE | Disposition: A | Discharge status: Home / Self Care | Payer: Self-pay | Source: Ambulatory Visit | Attending: Urology

## 2015-02-18 DIAGNOSIS — Z79899 Other long term (current) drug therapy: Secondary | ICD-10-CM | POA: Diagnosis not present

## 2015-02-18 DIAGNOSIS — E785 Hyperlipidemia, unspecified: Secondary | ICD-10-CM | POA: Diagnosis present

## 2015-02-18 DIAGNOSIS — C641 Malignant neoplasm of right kidney, except renal pelvis: Secondary | ICD-10-CM | POA: Diagnosis present

## 2015-02-18 DIAGNOSIS — Z01812 Encounter for preprocedural laboratory examination: Secondary | ICD-10-CM

## 2015-02-18 DIAGNOSIS — N139 Obstructive and reflux uropathy, unspecified: Secondary | ICD-10-CM | POA: Diagnosis present

## 2015-02-18 DIAGNOSIS — R066 Hiccough: Secondary | ICD-10-CM | POA: Diagnosis not present

## 2015-02-18 DIAGNOSIS — R310 Gross hematuria: Secondary | ICD-10-CM | POA: Diagnosis present

## 2015-02-18 DIAGNOSIS — F17210 Nicotine dependence, cigarettes, uncomplicated: Secondary | ICD-10-CM | POA: Diagnosis present

## 2015-02-18 DIAGNOSIS — Z9079 Acquired absence of other genital organ(s): Secondary | ICD-10-CM | POA: Diagnosis present

## 2015-02-18 DIAGNOSIS — R112 Nausea with vomiting, unspecified: Secondary | ICD-10-CM | POA: Diagnosis not present

## 2015-02-18 DIAGNOSIS — N2889 Other specified disorders of kidney and ureter: Secondary | ICD-10-CM | POA: Diagnosis present

## 2015-02-18 HISTORY — PX: LAPAROSCOPIC NEPHRECTOMY: SHX1930

## 2015-02-18 LAB — TYPE AND SCREEN
ABO/RH(D): O POS
Antibody Screen: NEGATIVE

## 2015-02-18 LAB — HEMOGLOBIN AND HEMATOCRIT, BLOOD
HCT: 46.4 % (ref 39.0–52.0)
Hemoglobin: 15 g/dL (ref 13.0–17.0)

## 2015-02-18 SURGERY — NEPHRECTOMY, RADICAL, LAPAROSCOPIC, ADULT
Anesthesia: General | Laterality: Right

## 2015-02-18 MED ORDER — HYDROMORPHONE HCL 1 MG/ML IJ SOLN
INTRAMUSCULAR | Status: AC
  Filled 2015-02-18: qty 1

## 2015-02-18 MED ORDER — LACTATED RINGERS IV SOLN
INTRAVENOUS | Status: DC | PRN
  Administered 2015-02-18 (×3): via INTRAVENOUS

## 2015-02-18 MED ORDER — OXYCODONE HCL 5 MG PO TABS
10.0000 mg | ORAL_TABLET | ORAL | Status: DC | PRN
  Administered 2015-02-18 – 2015-02-19 (×2): 10 mg via ORAL
  Filled 2015-02-18 (×2): qty 2

## 2015-02-18 MED ORDER — SODIUM BICARBONATE 650 MG PO TABS
650.0000 mg | ORAL_TABLET | Freq: Two times a day (BID) | ORAL | Status: DC
  Administered 2015-02-18 – 2015-02-20 (×6): 650 mg via ORAL
  Filled 2015-02-18 (×6): qty 1

## 2015-02-18 MED ORDER — SODIUM CHLORIDE 0.9 % IJ SOLN: 3.0000 mL | INTRAMUSCULAR | Status: DC | PRN

## 2015-02-18 MED ORDER — CEFAZOLIN SODIUM-DEXTROSE 2-3 GM-% IV SOLR
INTRAVENOUS | Status: AC
  Filled 2015-02-18: qty 50

## 2015-02-18 MED ORDER — SODIUM CHLORIDE 0.9 % IJ SOLN
INTRAMUSCULAR | Status: AC
  Filled 2015-02-18: qty 50

## 2015-02-18 MED ORDER — FENTANYL CITRATE (PF) 100 MCG/2ML IJ SOLN
INTRAMUSCULAR | Status: DC | PRN
  Administered 2015-02-18: 100 ug via INTRAVENOUS
  Administered 2015-02-18 (×3): 50 ug via INTRAVENOUS

## 2015-02-18 MED ORDER — DIPHENHYDRAMINE HCL 12.5 MG/5ML PO ELIX
12.5000 mg | ORAL_SOLUTION | Freq: Four times a day (QID) | ORAL | Status: DC | PRN
  Administered 2015-02-20: 12.5 mg via ORAL
  Filled 2015-02-18: qty 5

## 2015-02-18 MED ORDER — CEFAZOLIN SODIUM-DEXTROSE 2-3 GM-% IV SOLR
2.0000 g | Freq: Three times a day (TID) | INTRAVENOUS | Status: AC
  Administered 2015-02-18 (×2): 2 g via INTRAVENOUS
  Filled 2015-02-18 (×2): qty 50

## 2015-02-18 MED ORDER — CEFAZOLIN SODIUM-DEXTROSE 2-3 GM-% IV SOLR
2.0000 g | INTRAVENOUS | Status: AC
  Administered 2015-02-18: 2 g via INTRAVENOUS

## 2015-02-18 MED ORDER — GLYCOPYRROLATE 0.2 MG/ML IJ SOLN
INTRAMUSCULAR | Status: AC
  Filled 2015-02-18: qty 4

## 2015-02-18 MED ORDER — PHENYLEPHRINE HCL 10 MG/ML IJ SOLN
INTRAMUSCULAR | Status: DC | PRN
  Administered 2015-02-18: 80 ug via INTRAVENOUS
  Administered 2015-02-18: 40 ug via INTRAVENOUS
  Administered 2015-02-18 (×3): 80 ug via INTRAVENOUS

## 2015-02-18 MED ORDER — LIDOCAINE HCL (CARDIAC) 20 MG/ML IV SOLN
INTRAVENOUS | Status: DC | PRN
  Administered 2015-02-18: 50 mg via INTRAVENOUS

## 2015-02-18 MED ORDER — HYDROMORPHONE HCL 2 MG/ML IJ SOLN
INTRAMUSCULAR | Status: AC
  Filled 2015-02-18: qty 1

## 2015-02-18 MED ORDER — ONDANSETRON HCL 4 MG/2ML IJ SOLN
INTRAMUSCULAR | Status: AC
  Filled 2015-02-18: qty 2

## 2015-02-18 MED ORDER — LACTATED RINGERS IR SOLN
Status: DC | PRN
  Administered 2015-02-18: 1000 mL

## 2015-02-18 MED ORDER — OXYCODONE-ACETAMINOPHEN 5-325 MG PO TABS: 1.0000 | ORAL_TABLET | ORAL | Status: DC | PRN

## 2015-02-18 MED ORDER — NEOSTIGMINE METHYLSULFATE 10 MG/10ML IV SOLN
INTRAVENOUS | Status: DC | PRN
  Administered 2015-02-18: 5 mg via INTRAVENOUS

## 2015-02-18 MED ORDER — OXYBUTYNIN CHLORIDE 5 MG PO TABS
5.0000 mg | ORAL_TABLET | Freq: Three times a day (TID) | ORAL | Status: DC | PRN
  Administered 2015-02-19: 5 mg via ORAL
  Filled 2015-02-18: qty 1

## 2015-02-18 MED ORDER — DOCUSATE SODIUM 100 MG PO CAPS: 100.0000 mg | ORAL_CAPSULE | Freq: Two times a day (BID) | ORAL | Status: DC

## 2015-02-18 MED ORDER — MIDAZOLAM HCL 5 MG/5ML IJ SOLN
INTRAMUSCULAR | Status: DC | PRN
  Administered 2015-02-18: 2 mg via INTRAVENOUS

## 2015-02-18 MED ORDER — MIDAZOLAM HCL 2 MG/2ML IJ SOLN
INTRAMUSCULAR | Status: AC
  Filled 2015-02-18: qty 2

## 2015-02-18 MED ORDER — PHENYLEPHRINE 40 MCG/ML (10ML) SYRINGE FOR IV PUSH (FOR BLOOD PRESSURE SUPPORT)
PREFILLED_SYRINGE | INTRAVENOUS | Status: AC
  Filled 2015-02-18: qty 10

## 2015-02-18 MED ORDER — HYDROMORPHONE HCL 1 MG/ML IJ SOLN
INTRAMUSCULAR | Status: DC | PRN
  Administered 2015-02-18 (×2): 1 mg via INTRAVENOUS

## 2015-02-18 MED ORDER — VITAMIN D3 25 MCG (1000 UNIT) PO TABS
1000.0000 [IU] | ORAL_TABLET | Freq: Every morning | ORAL | Status: DC
  Administered 2015-02-18 – 2015-02-20 (×3): 1000 [IU] via ORAL
  Filled 2015-02-18 (×7): qty 1

## 2015-02-18 MED ORDER — LIDOCAINE HCL (CARDIAC) 20 MG/ML IV SOLN
INTRAVENOUS | Status: AC
  Filled 2015-02-18: qty 5

## 2015-02-18 MED ORDER — GLYCOPYRROLATE 0.2 MG/ML IJ SOLN
INTRAMUSCULAR | Status: DC | PRN
  Administered 2015-02-18: 0.4 mg via INTRAVENOUS

## 2015-02-18 MED ORDER — ROCURONIUM BROMIDE 100 MG/10ML IV SOLN
INTRAVENOUS | Status: DC | PRN
  Administered 2015-02-18 (×2): 10 mg via INTRAVENOUS
  Administered 2015-02-18: 5 mg via INTRAVENOUS
  Administered 2015-02-18: 35 mg via INTRAVENOUS

## 2015-02-18 MED ORDER — HYDROMORPHONE HCL 1 MG/ML IJ SOLN
0.2500 mg | INTRAMUSCULAR | Status: DC | PRN
  Administered 2015-02-18: 0.5 mg via INTRAVENOUS

## 2015-02-18 MED ORDER — BUPIVACAINE LIPOSOME 1.3 % IJ SUSP
20.0000 mL | Freq: Once | INTRAMUSCULAR | Status: AC
  Administered 2015-02-18: 40 mL
  Filled 2015-02-18: qty 20

## 2015-02-18 MED ORDER — SODIUM CHLORIDE 0.9 % IJ SOLN
INTRAMUSCULAR | Status: DC | PRN
  Administered 2015-02-18: 20 mL

## 2015-02-18 MED ORDER — BUPIVACAINE-EPINEPHRINE (PF) 0.25% -1:200000 IJ SOLN
INTRAMUSCULAR | Status: DC | PRN
  Administered 2015-02-18: 30 mL

## 2015-02-18 MED ORDER — NEOSTIGMINE METHYLSULFATE 10 MG/10ML IV SOLN
INTRAVENOUS | Status: AC
Start: 2015-02-18 — End: 2015-02-18
  Filled 2015-02-18: qty 1

## 2015-02-18 MED ORDER — DEXAMETHASONE SODIUM PHOSPHATE 10 MG/ML IJ SOLN
INTRAMUSCULAR | Status: DC | PRN
  Administered 2015-02-18: 10 mg via INTRAVENOUS

## 2015-02-18 MED ORDER — SENNOSIDES-DOCUSATE SODIUM 8.6-50 MG PO TABS
2.0000 | ORAL_TABLET | Freq: Every day | ORAL | Status: DC
  Administered 2015-02-18 – 2015-02-20 (×3): 2 via ORAL
  Filled 2015-02-18 (×3): qty 2

## 2015-02-18 MED ORDER — DEXAMETHASONE SODIUM PHOSPHATE 10 MG/ML IJ SOLN
INTRAMUSCULAR | Status: AC
  Filled 2015-02-18: qty 1

## 2015-02-18 MED ORDER — NICOTINE 21 MG/24HR TD PT24
21.0000 mg | MEDICATED_PATCH | Freq: Every day | TRANSDERMAL | Status: DC
  Administered 2015-02-18 – 2015-02-21 (×4): 21 mg via TRANSDERMAL
  Filled 2015-02-18 (×4): qty 1

## 2015-02-18 MED ORDER — ONDANSETRON HCL 4 MG/2ML IJ SOLN
4.0000 mg | INTRAMUSCULAR | Status: DC | PRN
  Administered 2015-02-18: 4 mg via INTRAVENOUS
  Filled 2015-02-18: qty 2

## 2015-02-18 MED ORDER — MORPHINE SULFATE 10 MG/ML IJ SOLN
2.0000 mg | INTRAMUSCULAR | Status: DC | PRN
  Administered 2015-02-18 – 2015-02-19 (×2): 2 mg via INTRAVENOUS
  Administered 2015-02-19: 3 mg via INTRAVENOUS
  Administered 2015-02-19: 2 mg via INTRAVENOUS
  Administered 2015-02-19: 4 mg via INTRAVENOUS
  Administered 2015-02-19 (×2): 2 mg via INTRAVENOUS
  Administered 2015-02-20: 3 mg via INTRAVENOUS
  Administered 2015-02-20 (×2): 2 mg via INTRAVENOUS
  Filled 2015-02-18 (×11): qty 1

## 2015-02-18 MED ORDER — DEXTROSE IN LACTATED RINGERS 5 % IV SOLN
INTRAVENOUS | Status: DC
  Administered 2015-02-18 (×2): via INTRAVENOUS

## 2015-02-18 MED ORDER — PROPOFOL 10 MG/ML IV BOLUS
INTRAVENOUS | Status: DC | PRN
  Administered 2015-02-18: 180 mg via INTRAVENOUS

## 2015-02-18 MED ORDER — ONDANSETRON HCL 4 MG/2ML IJ SOLN
INTRAMUSCULAR | Status: DC | PRN
  Administered 2015-02-18: 4 mg via INTRAVENOUS

## 2015-02-18 MED ORDER — FENTANYL CITRATE (PF) 250 MCG/5ML IJ SOLN
INTRAMUSCULAR | Status: AC
  Filled 2015-02-18: qty 5

## 2015-02-18 MED ORDER — LACTATED RINGERS IV SOLN: INTRAVENOUS | Status: DC

## 2015-02-18 MED ORDER — SUCCINYLCHOLINE CHLORIDE 20 MG/ML IJ SOLN
INTRAMUSCULAR | Status: DC | PRN
  Administered 2015-02-18: 100 mg via INTRAVENOUS

## 2015-02-18 MED ORDER — ROCURONIUM BROMIDE 100 MG/10ML IV SOLN
INTRAVENOUS | Status: AC
  Filled 2015-02-18: qty 1

## 2015-02-18 MED ORDER — ATORVASTATIN CALCIUM 20 MG PO TABS
20.0000 mg | ORAL_TABLET | Freq: Every day | ORAL | Status: DC
  Administered 2015-02-19 – 2015-02-20 (×2): 20 mg via ORAL
  Filled 2015-02-18 (×4): qty 1

## 2015-02-18 MED ORDER — ACETAMINOPHEN 500 MG PO TABS
1000.0000 mg | ORAL_TABLET | Freq: Four times a day (QID) | ORAL | Status: AC
  Administered 2015-02-18 – 2015-02-19 (×3): 1000 mg via ORAL
  Filled 2015-02-18 (×3): qty 2

## 2015-02-18 MED ORDER — DIPHENHYDRAMINE HCL 50 MG/ML IJ SOLN: 12.5000 mg | Freq: Four times a day (QID) | INTRAMUSCULAR | Status: DC | PRN

## 2015-02-18 MED ORDER — BACITRACIN-NEOMYCIN-POLYMYXIN 400-5-5000 EX OINT: 1.0000 "application " | TOPICAL_OINTMENT | Freq: Three times a day (TID) | CUTANEOUS | Status: DC | PRN

## 2015-02-18 MED ORDER — SODIUM CHLORIDE 0.9 % IV SOLN: 250.0000 mL | INTRAVENOUS | Status: DC | PRN

## 2015-02-18 MED ORDER — PROPOFOL 10 MG/ML IV BOLUS
INTRAVENOUS | Status: AC
  Filled 2015-02-18: qty 20

## 2015-02-18 MED ORDER — BUPIVACAINE-EPINEPHRINE (PF) 0.25% -1:200000 IJ SOLN
INTRAMUSCULAR | Status: AC
  Filled 2015-02-18: qty 30

## 2015-02-18 MED ORDER — SODIUM CHLORIDE 0.9 % IJ SOLN
3.0000 mL | Freq: Two times a day (BID) | INTRAMUSCULAR | Status: DC
  Administered 2015-02-19 – 2015-02-20 (×3): 3 mL via INTRAVENOUS

## 2015-02-18 SURGICAL SUPPLY — 58 items
APL ESCP 34 STRL LF DISP (HEMOSTASIS)
APPLICATOR SURGIFLO ENDO (HEMOSTASIS) ×1 IMPLANT
APPLIER CLIP ROT 10 11.4 M/L (STAPLE)
APR CLP MED LRG 11.4X10 (STAPLE)
BAG SPEC THK2 15X12 ZIP CLS (MISCELLANEOUS)
BAG ZIPLOCK 12X15 (MISCELLANEOUS) ×1 IMPLANT
BLADE EXTENDED COATED 6.5IN (ELECTRODE) IMPLANT
BLADE SURG SZ10 CARB STEEL (BLADE) ×3 IMPLANT
CHLORAPREP W/TINT 26ML (MISCELLANEOUS) ×3 IMPLANT
CLIP APPLIE ROT 10 11.4 M/L (STAPLE) IMPLANT
CLIP LIGATING HEM O LOK PURPLE (MISCELLANEOUS) ×5 IMPLANT
CLIP LIGATING HEMO LOK XL GOLD (MISCELLANEOUS) IMPLANT
CLIP LIGATING HEMO O LOK GREEN (MISCELLANEOUS) ×7 IMPLANT
COVER SURGICAL LIGHT HANDLE (MISCELLANEOUS) ×5 IMPLANT
CUTTER FLEX LINEAR 45M (STAPLE) ×3 IMPLANT
DRAIN CHANNEL 10F 3/8 F FF (DRAIN) IMPLANT
DRAPE INCISE IOBAN 66X45 STRL (DRAPES) ×3 IMPLANT
DRAPE WARM FLUID 44X44 (DRAPE) IMPLANT
ELECT REM PT RETURN 9FT ADLT (ELECTROSURGICAL) ×3
ELECTRODE REM PT RTRN 9FT ADLT (ELECTROSURGICAL) ×1 IMPLANT
EVACUATOR SILICONE 100CC (DRAIN) IMPLANT
FLOSEAL 10ML (HEMOSTASIS) IMPLANT
GLOVE BIOGEL M STRL SZ7.5 (GLOVE) ×36 IMPLANT
GOWN STRL REUS W/TWL LRG LVL3 (GOWN DISPOSABLE) ×18 IMPLANT
HEMOSTAT SURGICEL 4X8 (HEMOSTASIS) ×2 IMPLANT
KIT BASIN OR (CUSTOM PROCEDURE TRAY) ×3 IMPLANT
LIQUID BAND (GAUZE/BANDAGES/DRESSINGS) ×3 IMPLANT
MANIFOLD NEPTUNE II (INSTRUMENTS) ×3 IMPLANT
NDL SPNL 22GX2.5 QUINCKE BK (NEEDLE) ×1 IMPLANT
NEEDLE SPNL 22GX2.5 QUINCKE BK (NEEDLE) IMPLANT
PENCIL BUTTON HOLSTER BLD 10FT (ELECTRODE) ×3 IMPLANT
POSITIONER SURGICAL ARM (MISCELLANEOUS) ×4 IMPLANT
POUCH ENDO CATCH II 15MM (MISCELLANEOUS) ×3 IMPLANT
RELOAD 45 VASCULAR/THIN (ENDOMECHANICALS) ×3 IMPLANT
RELOAD STAPLE 45 3.5 BLU ETS (ENDOMECHANICALS) IMPLANT
RELOAD STAPLE TA45 3.5 REG BLU (ENDOMECHANICALS) IMPLANT
RETRACTOR LAPSCP 12X46 CVD (ENDOMECHANICALS) IMPLANT
RTRCTR LAPSCP 12X46 CVD (ENDOMECHANICALS)
SCISSORS LAP 5X35 DISP (ENDOMECHANICALS) ×1 IMPLANT
SET IRRIG TUBING LAPAROSCOPIC (IRRIGATION / IRRIGATOR) ×3 IMPLANT
SHEARS HARMONIC ACE PLUS 36CM (ENDOMECHANICALS) ×3 IMPLANT
SLEEVE XCEL OPT CAN 5 100 (ENDOMECHANICALS) ×6 IMPLANT
SOLUTION ANTI FOG 6CC (MISCELLANEOUS) ×2 IMPLANT
SPONGE LAP 4X18 X RAY DECT (DISPOSABLE) IMPLANT
SPONGE SURGIFOAM ABS GEL 100 (HEMOSTASIS) IMPLANT
SUT ETHILON 3 0 PS 1 (SUTURE) IMPLANT
SUT MNCRL AB 4-0 PS2 18 (SUTURE) ×6 IMPLANT
SUT PDS AB 0 CT1 36 (SUTURE) ×4 IMPLANT
SUT VIC AB 2-0 CT1 27 (SUTURE) ×3
SUT VIC AB 2-0 CT1 27XBRD (SUTURE) ×1 IMPLANT
SUT VICRYL 0 UR6 27IN ABS (SUTURE) ×6 IMPLANT
TOWEL OR 17X26 10 PK STRL BLUE (TOWEL DISPOSABLE) ×3 IMPLANT
TRAY FOLEY CATH 16FR SILVER (SET/KITS/TRAYS/PACK) ×1 IMPLANT
TRAY FOLEY W/METER SILVER 16FR (SET/KITS/TRAYS/PACK) ×3 IMPLANT
TRAY LAPAROSCOPIC (CUSTOM PROCEDURE TRAY) ×3 IMPLANT
TROCAR BLADELESS OPT 5 100 (ENDOMECHANICALS) ×7 IMPLANT
TROCAR XCEL 12X100 BLDLESS (ENDOMECHANICALS) ×5 IMPLANT
YANKAUER SUCT BULB TIP 10FT TU (MISCELLANEOUS) ×1 IMPLANT

## 2015-02-18 NOTE — Progress Notes (Signed)
Hgb. And Hct. Drawn by lab. 

## 2015-02-18 NOTE — H&P (Signed)
Reason For Visit Consultation regarding right renal mass.   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 In 2005 the patient was found to have 5 L of urine in his bladder torturous ureter is and dilated kidneys. Ultimately, the patient underwent a TURP and his system decompressed. He is now voiding without issue. However, recently the patient presented to his primary care provider with gross hematuria. He subsequently underwent an ultrasound which was unrevealing and of poor quality. He then underwent an MRI which revealed a concerning right renal mass. The right kidney was notably atrophic. The patient then was referred to Dr. Kathie Rhodes, MD for ongoing management of his hematuria. He subsequently was taken to the operating room where he had cystoscopy and bilateral retrograde pyelograms which were normal. Ultimately, the patient had no obvious cause of his hematuria. However, the renal mass is concerning. The patient also had a renogram to determine the function of that right atrophic kidney which showed up to be about 10%.  The patient presents to me for ongoing management and consideration of radical nephrectomy.  The patient has a baseline creatinine of approximately 3.0. His medications consist of sodium bicarbonate and Lipitor. The patient is otherwise reasonably healthy, except he does have a 45-pack-year smoking history. He has no trouble walking steps, has no cardiac history, and has tolerated surgery in the past. As a past surgical history of left inguinal hernia repair.   Past Medical History Problems  1. History of hyperlipidemia (Z86.39)  Surgical History Problems  1. History of Cystoscopy With Ureteroscopy Bilateral 2. History of Transurethral Resection Of Prostate (TURP)  Current Meds 1. Co Q 10 CAPS;  Therapy: (Recorded:04Feb2016) to Recorded 2. Lipitor 20 MG Oral Tablet (Atorvastatin Calcium);  Therapy:  (Recorded:04Feb2016) to Recorded 3. Multi-Day Vitamins TABS;  Therapy: (Recorded:04Feb2016) to Recorded 4. Sodium Dichloroacetate Powder;  Therapy: (Recorded:04Feb2016) to Recorded 5. Sulfamethoxazole-Trimethoprim 400-80 MG Oral Tablet; TAKE 1 TABLET TWICE DAILY;  Therapy: [id number] to (Complete:07Apr2016)  Requested for: [phone]; Last  Rx:07Feb2016 Ordered 6. Vitamin D TABS;  Therapy: (Recorded:04Feb2016) to Recorded  Allergies Medication  1. No Known Drug Allergies  Family History Problems  1. No pertinent family history : Mother  Social History Problems  1. Alcohol use (Z78.9)   1 glass per month 2. Caffeine use (F15.90)   10 cups per day 3. Current every day smoker (F17.200) 4. Married 5. Number of children   none 6. Occupation   Building services engineer  Review of Systems No changes in pts bowel habits, neurological changes, or progressive lower urinary tract symptoms.      Physical Exam Constitutional: Well nourished and well developed . No acute distress.  ENT:. The ears and nose are normal in appearance.  Pulmonary: No respiratory distress and normal respiratory rhythm and effort.  Cardiovascular: Heart rate and rhythm are normal . No peripheral edema.  Abdomen: The abdomen is soft and nontender. No masses are palpated. No CVA tenderness.  An umbilical hernia is present, which is reducible. No hepatosplenomegaly noted.  Skin: Normal skin turgor, no visible rash and no visible skin lesions.  Neuro/Psych:. Mood and affect are appropriate.    Results/Data Urine [Data Includes: Last 1 Day]   [id number]  COLOR YELLOW   APPEARANCE CLEAR   SPECIFIC GRAVITY 1.020   pH 6.0   GLUCOSE NEG mg/dL  BILIRUBIN NEG   KETONE NEG mg/dL  BLOOD NEG   PROTEIN NEG mg/dL  UROBILINOGEN 0.2 mg/dL  NITRITE NEG  LEUKOCYTE ESTERASE NEG    Patient's urinalysis is normal.  Abdomen thoroughly reviewed the patient's MRI which reveals approximately 2 cm mass which is endophytic  and sentinel lesion located on the right kidney. Right kidney is severely atrophic with numerous Bosniak 1/2 cyst. In addition, the patient has hepatic cysts as well as cysts on the other kidney.  I also reviewed the patient's renogram demonstrating 10% function of the right kidney.   Assessment Assessed  1. 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   Plan Health Maintenance  1. UA With REFLEX; [Do Not Release]; Status:Complete;   [id number] 03:52PM Renal neoplasm  2. Follow-up Schedule Surgery Office  Follow-up  Status: Hold For - Appointment   Requested for: [id number]  Discussion/Summary Based on the above, I recommended the patient undergo a right-sided laparoscopic radical nephrectomy. I went over this operation in detail with the patient including the risks and benefits. We discussed port position and I outlined the position of the 4 planned trocars. I also explained to him that he will need extraction incision which typically is in the left lower quadrant. I discussed the actual surgery with him and we went over the various structures better intimate association with the kidney and the risk of damage thereof. I outlined the risk of injury to the major nerves and vessels in proximity to the kidney. I explained the patient the expected hospital course. I told him that he should plan to be in the hospital at least 2-3 days. Further, I told him that he would likely need approximately 1 month to fully recover. Given the severity of this planned operation, we will have the patient be seen by his primary care doctor and cleared for surgery. We will plan to schedule this within the next month.

## 2015-02-18 NOTE — Progress Notes (Signed)
Day of Surgery Subjective: No complaints, resting comfortably.  Objective: Vital signs in last 24 hours: Temp:  [97.5 F (36.4 C)-98.2 F (36.8 C)] 97.9 F (36.6 C) (04/22 1602) Pulse Rate:  [72-90] 78 (04/22 1602) Resp:  [12-19] 15 (04/22 1602) BP: (116-161)/(75-91) 122/85 mmHg (04/22 1602) SpO2:  [94 %-100 %] 97 % (04/22 1602) Weight:  [169 lb (76.658 kg)-172 lb (78.019 kg)] 172 lb (78.019 kg) (04/22 1152)  Intake/Output from previous day:   Intake/Output this shift: Total I/O In: 3150 [I.V.:3100; IV Piggyback:50] Out: [id number] [Urine:1075]  Physical Exam:  General: Alert and oriented CV: RRR Lungs: Clear Abdomen: Soft, ND Incisions: c/d/i Foley: clear yellow urine Ext: NT, No erythema  Lab Results:  Recent Labs  02/18/15 1025  HGB 15.0  HCT 46.4   BMET No results for input(s): NA, K, CL, CO2, GLUCOSE, BUN, CREATININE, CALCIUM in the last 72 hours.   Studies/Results: No results found.  Assessment/Plan: POD#1 from right lap nephrectomy of non-functioning kidney. Routine care. -- Foley out and normalize tomorrow -- D/C sunday    LOS: 0 days   Tana Trefry, [place] C 02/18/2015, 4:40 PM

## 2015-02-18 NOTE — Anesthesia Procedure Notes (Signed)
Procedure Name: Intubation Date/Time: 02/18/2015 7:28 AM Performed by: Noralyn Pick D Pre-anesthesia Checklist: Patient identified, Emergency Drugs available, Suction available and Patient being monitored Patient Re-evaluated:Patient Re-evaluated prior to inductionOxygen Delivery Method: Circle System Utilized Preoxygenation: Pre-oxygenation with 100% oxygen Intubation Type: IV induction Ventilation: Mask ventilation without difficulty Laryngoscope Size: Mac and 4 Tube type: Oral Tube size: 7.5 mm Number of attempts: 1 Airway Equipment and Method: Stylet and Oral airway Placement Confirmation: ETT inserted through vocal cords under direct vision,  positive ETCO2 and breath sounds checked- equal and bilateral Secured at: 22 cm Tube secured with: Tape Dental Injury: Teeth and Oropharynx as per pre-operative assessment

## 2015-02-18 NOTE — Op Note (Signed)
Preoperative diagnosis:  1. Right renal mass   Postoperative diagnosis:  1. same   Procedure: 1. Right laparoscopic radical nephrectomy  Surgeon: Ardis Hughs, MD Resident surgeon: Dr. Amaryllis Dyke, M.D. RN first assist: Orest Dikes, FA RN  Anesthesia: General  Complications: None  Intraoperative findings: Atrophic right kidney  EBL: 25 mL  Specimens: Right kidney and proximal ureter  Indication: Keith Rivera is a 64 y.o. 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  After reviewing the management options for treatment, he elected to proceed with the above surgical procedure(s). We have discussed the potential benefits and risks of the procedure, side effects of the proposed treatment, the likelihood of the patient achieving the goals of the procedure, and any potential problems that might occur during the procedure or recuperation. Informed consent has been obtained.  Description of procedure:  A site was selected lateral to the umbilicus for placement of the camera port. This was placed using a standard open Hassan technique which allowed entry into the peritoneal cavity under direct vision and without difficulty. A 12 mm Hassan cannula was placed and a pneumoperitoneum established. The camera was then used to inspect the abdomen and there was no evidence of any intra-abdominal injuries or other abnormalities. The remaining abdominal ports were then placed under visual guidance.  A second 12 mm port was placed in the right lower quadrant approximately 8 cm away from the camera. A 5 mm port was placed in the right upper quadrant again 8 cm away from the camera. A second 5 mm port was placed just inferior to the xiphoid process in the midline, this was used as a liver retractor.  An additional 5 mm port was then placed in the right lower quadrant at the anterior axillary line lateral to the 12 mm port. All ports were placed under  direct vision without difficulty.   The white line of Toldt was incised allowing the colon to be mobilized medially and the plane between the mesocolon and the anterior layer of Gerota's fascia to be developed and the kidney exposed. The ureter and gonadal vein were identified inferiorly and the ureter was lifted anteriorly off the psoas muscle. Dissection proceeded superiorly along the gonadal vein until the renal vein was identified. The renal hilum was then carefully isolated with a combination of blunt and sharp dissectiong allowing the renal arterial and venous structures to be separated and isolated.  The renal artery was isolated and ligated using large laparoscopic clips, 2 clips down and one clip up. The artery was then cut using the Leopard scopic scissors. The renal vein was then isolated and also ligated and divided with a 45 mm Flex ETS stapler.  Gerota's fascia was intentionally entered superiorly and the space between the adrenal gland and the kidney was developed allowing the adrenal gland to be spared. The hepatorenal ligaments were divided.. The lateral and posterior attachements to the kidney were then divided. Once the kidney was free from its attachments  40cc of 0.25% rupivocaine was then injected into the right anterior axillary line b/w the iliac crest and the twelfth rib under laparoscopic guidance. The layer between the tranversus abdominus and the internal oblique was targeted.  The kidney/ureter specimen was then placed into a 15 mm Endocatch II retrieval bag. The renal hilum, liver, adrenal bed and gonadal vein areas were each inspected and hemostasis was ensured with the pneomperitoneal pressures lowered. Surgicel was then placed over the hilum.  The camera was then brought to the 12 mm port laterally and the camera port was removed and the fascia closed using a [place] needle with a 0 Vicryl.  All the ports were then removed under visual guidance. The lateral 12 mm port  and 5 mm port were then connected sharply with a 15 blade. Then opened this incision down to the external oblique fascia. We then spread the muscle fibers down to the internal oblique fascia which we then opened with cautery. These were then spread in all muscle spared and the posterior peritoneum was opened. The rectus muscle was pulled medially. The specimen was then removed through these incision. The internal oblique fascia was then closed with a 0 Vicryl in a running fashion. The external oblique fascia was then closed with a 0 PDS in a running fashion.  All incisions were injected with local anesthetic and reapproximated at the skin with 4-0 monocryl sutures. Dermabond was applied to the skin. The patient tolerated the procedure well and without complications and was transferred to the recovery unit in satisfactory condition.   Ardis Hughs, M.D.

## 2015-02-18 NOTE — Progress Notes (Signed)
Hgb. 15.0- Hct. 46.4- results noted

## 2015-02-18 NOTE — Transfer of Care (Signed)
Immediate Anesthesia Transfer of Care Note  Patient: Keith Rivera  Procedure(s) Performed: Procedure(s): LAPAROSCOPIC RIGHT RADICAL NEPHRECTOMY (Right)  Patient Location: PACU  Anesthesia Type:General  Level of Consciousness: awake, alert  and oriented  Airway & Oxygen Therapy: Patient Spontanous Breathing and Patient connected to face mask oxygen  Post-op Assessment: Report given to RN and Post -op Vital signs reviewed and stable  Post vital signs: Reviewed and stable  Last Vitals:  Filed Vitals:   02/18/15 0543  BP: 116/80  Pulse: 87  Temp: 36.8 C  Resp: 18    Complications: No apparent anesthesia complications

## 2015-02-18 NOTE — Progress Notes (Signed)
Held second walk this evening d/t increased nausea and pain. Pt had episode of vomiting x 2 with zofran given. Pt reports feeling better, but nausea returns when sitting up. Will continue to monitor.

## 2015-02-18 NOTE — Anesthesia Postprocedure Evaluation (Signed)
  Anesthesia Post-op Note  Patient: Keith Rivera  Procedure(s) Performed: Procedure(s) (LRB): LAPAROSCOPIC RIGHT RADICAL NEPHRECTOMY (Right)  Patient Location: PACU  Anesthesia Type: General  Level of Consciousness: awake and alert   Airway and Oxygen Therapy: Patient Spontanous Breathing  Post-op Pain: mild  Post-op Assessment: Post-op Vital signs reviewed, Patient's Cardiovascular Status Stable, Respiratory Function Stable, Patent Airway and No signs of Nausea or vomiting  Last Vitals:  Filed Vitals:   02/18/15 1152  BP: 117/84  Pulse: 75  Temp: 36.4 C  Resp: 14    Post-op Vital Signs: stable   Complications: No apparent anesthesia complications

## 2015-02-19 LAB — CBC
HCT: 42.3 % (ref 39.0–52.0)
Hemoglobin: 13.8 g/dL (ref 13.0–17.0)
MCH: 30.9 pg (ref 26.0–34.0)
MCHC: 32.6 g/dL (ref 30.0–36.0)
MCV: 94.6 fL (ref 78.0–100.0)
Platelets: 174 10*3/uL (ref 150–400)
RBC: 4.47 MIL/uL (ref 4.22–5.81)
RDW: 14 % (ref 11.5–15.5)
WBC: 16.1 10*3/uL — AB (ref 4.0–10.5)

## 2015-02-19 LAB — BASIC METABOLIC PANEL
Anion gap: 6 (ref 5–15)
BUN: 23 mg/dL (ref 6–23)
CO2: 24 mmol/L (ref 19–32)
Calcium: 8.8 mg/dL (ref 8.4–10.5)
Chloride: 111 mmol/L (ref 96–112)
Creatinine, Ser: 2.95 mg/dL — ABNORMAL HIGH (ref 0.50–1.35)
GFR, EST AFRICAN AMERICAN: 24 mL/min — AB (ref >90)
GFR, EST NON AFRICAN AMERICAN: 21 mL/min — AB (ref >90)
Glucose, Bld: 141 mg/dL — ABNORMAL HIGH (ref 70–99)
Potassium: 4.6 mmol/L (ref 3.5–5.1)
SODIUM: 141 mmol/L (ref 135–145)

## 2015-02-19 MED ORDER — OXYCODONE-ACETAMINOPHEN 5-325 MG PO TABS
1.0000 | ORAL_TABLET | ORAL | Status: DC | PRN
  Administered 2015-02-19 – 2015-02-21 (×9): 2 via ORAL
  Filled 2015-02-19 (×9): qty 2

## 2015-02-19 NOTE — Progress Notes (Signed)
1 Day Post-Op Subjective: Nausea with small volume emesis yesterday evening, with resolution of nausea. Tolerated clears. Ambulated once. Pain controlled adequately.  Objective: Vital signs in last 24 hours: Temp:  [97.5 F (36.4 C)-98 F (36.7 C)] 98 F (36.7 C) (04/23 0455) Pulse Rate:  [72-90] 72 (04/23 0455) Resp:  [12-19] 16 (04/23 0455) BP: (96-161)/(58-91) 104/63 mmHg (04/23 0455) SpO2:  [92 %-100 %] 94 % (04/23 0455) Weight:  [172 lb (78.019 kg)] 172 lb (78.019 kg) (04/22 1152)  Intake/Output from previous day: 04/22 0701 - 04/23 0700 In: 3900 [I.V.:3800; IV Piggyback:100] Out: 1775 [Urine:1775] Intake/Output this shift:    Physical Exam:  General: Alert and oriented CV: RRR Lungs: Clear Abdomen: Soft, ND Incisions: c/d/i Foley: clear yellow urine Ext: NT, No erythema  Lab Results:  Recent Labs  02/18/15 1025 02/19/15 0422  HGB 15.0 13.8  HCT 46.4 42.3   BMET  Recent Labs  02/19/15 0422  NA 141  K 4.6  CL 111  CO2 24  GLUCOSE 141*  BUN 23  CREATININE 2.95*  CALCIUM 8.8     Studies/Results: No results found.  Assessment/Plan: POD#1 from right lap nephrectomy of non-functioning kidney. Routine care. -- Medlock, regular diet -- Continue PO pain meds -- Foley out this am -- OOB/ambulation/SCDs -- D/C sunday    LOS: 1 day   Lilliam Chamblee, [place] C 02/19/2015, 8:58 AM

## 2015-02-19 NOTE — Progress Notes (Signed)
Pt unable to void 6 hours after taking out foley catheter.  PVR showed >999cc's in bladder.  MD made aware.  Foley placed per MD order.  990cc's of output after foley was placed.

## 2015-02-20 LAB — BASIC METABOLIC PANEL
ANION GAP: 7 (ref 5–15)
BUN: 23 mg/dL (ref 6–23)
CO2: 26 mmol/L (ref 19–32)
CREATININE: 2.92 mg/dL — AB (ref 0.50–1.35)
Calcium: 9 mg/dL (ref 8.4–10.5)
Chloride: 107 mmol/L (ref 96–112)
GFR, EST AFRICAN AMERICAN: 25 mL/min — AB (ref >90)
GFR, EST NON AFRICAN AMERICAN: 21 mL/min — AB (ref >90)
Glucose, Bld: 94 mg/dL (ref 70–99)
Potassium: 3.9 mmol/L (ref 3.5–5.1)
Sodium: 140 mmol/L (ref 135–145)

## 2015-02-20 LAB — CBC
HEMATOCRIT: 43.6 % (ref 39.0–52.0)
HEMOGLOBIN: 14.8 g/dL (ref 13.0–17.0)
MCH: 32.2 pg (ref 26.0–34.0)
MCHC: 33.9 g/dL (ref 30.0–36.0)
MCV: 94.8 fL (ref 78.0–100.0)
Platelets: 143 10*3/uL — ABNORMAL LOW (ref 150–400)
RBC: 4.6 MIL/uL (ref 4.22–5.81)
RDW: 14.2 % (ref 11.5–15.5)
WBC: 13.6 10*3/uL — AB (ref 4.0–10.5)

## 2015-02-20 MED ORDER — BISACODYL 10 MG RE SUPP
10.0000 mg | Freq: Once | RECTAL | Status: AC
  Administered 2015-02-20: 10 mg via RECTAL
  Filled 2015-02-20: qty 1

## 2015-02-20 MED ORDER — TAMSULOSIN HCL 0.4 MG PO CAPS: 0.4000 mg | ORAL_CAPSULE | Freq: Every day | ORAL | Status: DC

## 2015-02-20 NOTE — Progress Notes (Signed)
2 Days Post-Op Subjective: Much improved over the course of the day. Pain much improved. Ambulated frequently today (5-6 times around unit) and spent most of day up in chair. Biggest complaint is hiccups. Tolerating regular food, but appetite is decreased. Still with high volume UOP.  Objective: Vital signs in last 24 hours: Temp:  [98 F (36.7 C)-98.4 F (36.9 C)] 98.2 F (36.8 C) (04/24 2029) Pulse Rate:  [72-90] 88 (04/24 2029) Resp:  [15-18] 16 (04/24 2029) BP: (128-164)/(71-93) 150/89 mmHg (04/24 2029) SpO2:  [92 %-97 %] 97 % (04/24 2029)  Intake/Output from previous day: 04/23 0701 - 04/24 0700 In: 2640 [P.O.:240; I.V.:2400] Out: 5790 [Urine:5790] Intake/Output this shift: Total I/O In: -  Out: 600 [Urine:600]  Physical Exam:  General: Alert and oriented CV: RRR Lungs: Clear Abdomen: Soft, ND Incisions: c/d/i Foley: clear yellow urine Ext: NT, No erythema  Lab Results:  Recent Labs  02/18/15 1025 02/19/15 0422 02/20/15 0748  HGB 15.0 13.8 14.8  HCT 46.4 42.3 43.6   BMET  Recent Labs  02/19/15 0422 02/20/15 0748  NA 141 140  K 4.6 3.9  CL 111 107  CO2 24 26  GLUCOSE 141* 94  BUN 23 23  CREATININE 2.95* 2.92*  CALCIUM 8.8 9.0     Studies/Results: No results found.  Assessment/Plan: POD#1 from right lap nephrectomy of non-functioning kidney. Routine care. -- Medlock, regular diet -- Continue PO pain meds -- Continue foley for discharge today. Started flomax. --  Follow up labs today and repeat BMP tomorrow given high output -- Suppository this am -- OOB/ambulation/SCDs -- D/C tomorrow     LOS: 2 days   Wynetta Emery, Haji C 02/20/2015, 8:39 PM

## 2015-02-20 NOTE — Progress Notes (Addendum)
2 Days Post-Op Subjective: Doing better this am. Pain improved. Ambulated twice yesterday but then had too much pain to ambulate more. No more nausea. Tolerating regular diet. Failed TOV with ~1L in his bladder without sensation to urinate so foley was replaced. High volume UOP since then.  Objective: Vital signs in last 24 hours: Temp:  [97 F (36.1 Rivera)-98.4 F (36.9 Rivera)] 98.4 F (36.9 Rivera) (04/24 0448) Pulse Rate:  [72-76] 76 (04/24 0448) Resp:  [15-16] 15 (04/24 0448) BP: (124-129)/(69-81) 129/71 mmHg (04/24 0448) SpO2:  [92 %-94 %] 94 % (04/24 0448)  Intake/Output from previous day: 04/23 0701 - 04/24 0700 In: 2640 [P.O.:240; I.V.:2400] Out: 5790 [Urine:5790] Intake/Output this shift:    Physical Exam:  General: Alert and oriented CV: RRR Lungs: Clear Abdomen: Soft, ND Incisions: Rivera/d/i Foley: clear yellow urine Ext: NT, No erythema  Lab Results:  Recent Labs  02/18/15 1025 02/19/15 0422  HGB 15.0 13.8  HCT 46.4 42.3   BMET  Recent Labs  02/19/15 0422  NA 141  K 4.6  CL 111  CO2 24  GLUCOSE 141*  BUN 23  CREATININE 2.95*  CALCIUM 8.8     Studies/Results: No results found.  Assessment/Plan: POD#1 from right lap nephrectomy of non-functioning kidney. Routine care. -- Medlock, regular diet -- Continue PO pain meds -- Continue foley for discharge today. Started flomax. --  Follow up labs today and repeat BMP tomorrow -- Suppository this am -- OOB/ambulation/SCDs -- D/Rivera tomorrow  I have seen pt w/ Dr Wynetta Rivera and agree with above assessment/plan    LOS: 2 days   Wynetta Rivera, Keith Rivera 02/20/2015, 7:45 AM

## 2015-02-21 ENCOUNTER — Encounter (HOSPITAL_COMMUNITY): Payer: Self-pay | Admitting: Urology

## 2015-02-21 LAB — BASIC METABOLIC PANEL
Anion gap: 9 (ref 5–15)
BUN: 22 mg/dL (ref 6–23)
CO2: 24 mmol/L (ref 19–32)
CREATININE: 2.55 mg/dL — AB (ref 0.50–1.35)
Calcium: 8.9 mg/dL (ref 8.4–10.5)
Chloride: 103 mmol/L (ref 96–112)
GFR, EST AFRICAN AMERICAN: 29 mL/min — AB (ref >90)
GFR, EST NON AFRICAN AMERICAN: 25 mL/min — AB (ref >90)
GLUCOSE: 143 mg/dL — AB (ref 70–99)
Potassium: 3.4 mmol/L — ABNORMAL LOW (ref 3.5–5.1)
Sodium: 136 mmol/L (ref 135–145)

## 2015-02-21 NOTE — Progress Notes (Signed)
D/C instructions and leg bag/foley care reviewed w/ pt and wife. Both verbalized and demonstrated understanding and all questions answered. Pt d/c in stable condition in w/c by NT to wife's car. Pt in possession of d/c instructions, scripts, foley/leg bag supplies, and all personal belongings.

## 2015-02-21 NOTE — Discharge Instructions (Signed)
Activity:  You are encouraged to ambulate frequently (about every hour during waking hours) to help prevent blood clots from forming in your legs or lungs.  However, you should not engage in any heavy lifting (> 10-15 lbs), strenuous activity, or straining.   Diet: You should advance your diet as instructed by your physician.  It will be normal to have some bloating, nausea, and abdominal discomfort intermittently.   Prescriptions:  You will be provided a prescription for pain medication to take as needed.  If your pain is not severe enough to require the prescription pain medication, you may take extra strength Tylenol instead which will have less side effects.  You should also take a prescribed stool softener to avoid straining with bowel movements as the prescription pain medication may constipate you.   Incisions: You may remove your dressing bandages 48 hours after surgery if not removed in the hospital.  You will either have some small staples or special tissue glue at each of the incision sites. Once the bandages are removed (if present), the incisions may stay open to air.  You may start showering (but not soaking or bathing in water) the 2nd day after surgery and the incisions simply need to be patted dry after the shower.  No additional care is needed.   What to call us about: You should call the office [phone]) if you develop fever > 101 or develop persistent vomiting. Activity:  You are encouraged to ambulate frequently (about every hour during waking hours) to help prevent blood clots from forming in your legs or lungs.  However, you should not engage in any heavy lifting (> 10-15 lbs), strenuous activity, or straining.    Foley Catheter Care A Foley catheter is a soft, flexible tube that is placed into the bladder to drain urine. A Foley catheter may be inserted if:  You leak urine or are not able to control when you urinate (urinary incontinence).  You are not able to  urinate when you need to (urinary retention).  You had prostate surgery or surgery on the genitals.  You have certain medical conditions, such as multiple sclerosis, dementia, or a spinal cord injury. If you are going home with a Foley catheter in place, follow the instructions below. TAKING CARE OF THE CATHETER 1. Wash your hands with soap and water. 2. Using mild soap and warm water on a clean washcloth:  Clean the area on your body closest to the catheter insertion site using a circular motion, moving away from the catheter. Never wipe toward the catheter because this could sweep bacteria up into the urethra and cause infection.  Remove all traces of soap. Pat the area dry with a clean towel. For males, reposition the foreskin. 3. Attach the catheter to your leg so there is no tension on the catheter. Use adhesive tape or a leg strap. If you are using adhesive tape, remove any sticky residue left behind by the previous tape you used. 4. Keep the drainage bag below the level of the bladder, but keep it off the floor. 5. Check throughout the day to be sure the catheter is working and urine is draining freely. Make sure the tubing does not become kinked. 6. Do not pull on the catheter or try to remove it. Pulling could damage internal tissues. TAKING CARE OF THE DRAINAGE BAGS You will be given two drainage bags to take home. One is a large overnight drainage bag, and the other is a smaller  leg bag that fits underneath clothing. You may wear the overnight bag at any time, but you should never wear the smaller leg bag at night. Follow the instructions below for how to empty, change, and clean your drainage bags. Emptying the Drainage Bag You must empty your drainage bag when it is  - full or at least 2-3 times a day. 1. Wash your hands with soap and water. 2. Keep the drainage bag below your hips, below the level of your bladder. This stops urine from going back into the tubing and into your  bladder. 3. Hold the dirty bag over the toilet or a clean container. 4. Open the pour spout at the bottom of the bag and empty the urine into the toilet or container. Do not let the pour spout touch the toilet, container, or any other surface. Doing so can place bacteria on the bag, which can cause an infection. 5. Clean the pour spout with a gauze pad or cotton ball that has rubbing alcohol on it. 6. Close the pour spout. 7. Attach the bag to your leg with adhesive tape or a leg strap. 8. Wash your hands well. Changing the Drainage Bag Change your drainage bag once a month or sooner if it starts to smell bad or look dirty. Below are steps to follow when changing the drainage bag. 1. Wash your hands with soap and water. 2. Pinch off the rubber catheter so that urine does not spill out. 3. Disconnect the catheter tube from the drainage tube at the connection valve. Do not let the tubes touch any surface. 4. Clean the end of the catheter tube with an alcohol wipe. Use a different alcohol wipe to clean the end of the drainage tube. 5. Connect the catheter tube to the drainage tube of the clean drainage bag. 6. Attach the new bag to the leg with adhesive tape or a leg strap. Avoid attaching the new bag too tightly. 7. Wash your hands well. Cleaning the Drainage Bag 1. Wash your hands with soap and water. 2. Wash the bag in warm, soapy water. 3. Rinse the bag thoroughly with warm water. 4. Fill the bag with a solution of white vinegar and water (1 cup vinegar to 1 qt warm water [.2 L vinegar to 1 L warm water]). Close the bag and soak it for 30 minutes in the solution. 5. Rinse the bag with warm water. 6. Hang the bag to dry with the pour spout open and hanging downward. 7. Store the clean bag (once it is dry) in a clean plastic bag. 8. Wash your hands well. PREVENTING INFECTION  Wash your hands before and after handling your catheter.  Take showers daily and wash the area where the catheter  enters your body. Do not take baths. Replace wet leg straps with dry ones, if this applies.  Do not use powders, sprays, or lotions on the genital area. Only use creams, lotions, or ointments as directed by your caregiver.  For females, wipe from front to back after each bowel movement.  Drink enough fluids to keep your urine clear or pale yellow unless you have a fluid restriction.  Do not let the drainage bag or tubing touch or lie on the floor.  Wear cotton underwear to absorb moisture and to keep your skin drier. SEEK MEDICAL CARE IF:   Your urine is cloudy or smells unusually bad.  Your catheter becomes clogged.  You are not draining urine into the bag or your  bladder feels full.  Your catheter starts to leak. SEEK IMMEDIATE MEDICAL CARE IF:   You have pain, swelling, redness, or pus where the catheter enters the body.  You have pain in the abdomen, legs, lower back, or bladder.  You have a fever.  You see blood fill the catheter, or your urine is pink or red.  You have nausea, vomiting, or chills.  Your catheter gets pulled out. MAKE SURE YOU:   Understand these instructions.  Will watch your condition.  Will get help right away if you are not doing well or get worse. Document Released: 10/15/2005 Document Revised: 03/01/2014 Document Reviewed: 10/06/2012 [facility] [employer] Patient Information 2015 [place], [place]. This information is not intended to replace advice given to you by your health care provider. Make sure you discuss any questions you have with your health care provider.

## 2015-02-21 NOTE — Discharge Summary (Signed)
Physician Discharge Summary  Patient ID: Keith Rivera MRN: [id number] DOB/AGE: Apr 19, 1951 64 y.o.  Admit date: 02/18/2015 Discharge date: 02/21/2015  Admission Diagnoses:  Discharge Diagnoses:  Active Problems:   Renal mass, right   Discharged Condition: good  Hospital Course: The patient underwent right lap nephrectomy on 02/18/2015. They tolerated the procedure well and recovered on the floor without complication. Their diet was advanced to regular. They were ambulatory. Their pain was controlled with PO pain meds. They were unable to void after foley removal so this was replaced with ~1L in the bladder, but with no sensation of fullness. By POD#3 they had met all criteria for discharge.    Consults: None  Discharge Exam: Blood pressure 143/86, pulse 72, temperature 98.3 F (36.8 C), temperature source Oral, resp. rate 17, height $RemoveBe'5\' 11"'dhdBMmfoT$  (1.803 m), weight 172 lb (78.019 kg), SpO2 98 %. normal WOB. Incisions C/D/I. Abdomen soft, NT/ND. Foley clear yellow. Extremities WWP, no edema   Disposition: 01-Home or Self Care  Discharge Instructions    Nursing communication    Complete by:  As directed   Teach foley care. Provide leg bag.            Medication List    TAKE these medications        atorvastatin 20 MG tablet  Commonly known as:  LIPITOR  Take 20 mg by mouth at bedtime.     cholecalciferol 1000 UNITS tablet  Commonly known as:  VITAMIN D  Take 1,000 Units by mouth every morning.     CO Q10 PO  Take 1 tablet by mouth at bedtime.     docusate sodium 100 MG capsule  Commonly known as:  COLACE  Take 1 capsule (100 mg total) by mouth 2 (two) times daily.     multivitamin with minerals Tabs tablet  Take 1 tablet by mouth every morning.     oxyCODONE-acetaminophen 5-325 MG per tablet  Commonly known as:  ROXICET  Take 1-2 tablets by mouth every 4 (four) hours as needed for moderate pain or severe pain.     polyethylene glycol-electrolytes 420 G solution   Commonly known as:  TRILYTE  Take 4,000 mLs by mouth as directed.     sodium bicarbonate 650 MG tablet  Take 650 mg by mouth 2 (two) times daily.           Follow-up Information    Follow up with Ardis Hughs, MD On 03/04/2015.   Specialty:  Urology   Why:  3:15pm   Contact information:   [place] [id number] [phone]       Signed: [name], [name] 02/21/2015, 6:53 AM

## 2015-08-29 ENCOUNTER — Other Ambulatory Visit: Payer: Self-pay | Admitting: Urology

## 2015-08-29 ENCOUNTER — Ambulatory Visit (HOSPITAL_COMMUNITY)
Admission: RE | Admit: 2015-08-29 | Discharge: 2015-08-29 | Disposition: A | Discharge status: Home / Self Care | Payer: Medicare Other | Source: Ambulatory Visit | Attending: Urology | Admitting: Urology

## 2015-08-29 DIAGNOSIS — C641 Malignant neoplasm of right kidney, except renal pelvis: Secondary | ICD-10-CM

## 2015-08-29 DIAGNOSIS — C649 Malignant neoplasm of unspecified kidney, except renal pelvis: Secondary | ICD-10-CM | POA: Diagnosis not present

## 2015-10-20 SURGERY — Surgical Case
Anesthesia: *Unknown

## 2015-11-22 ENCOUNTER — Encounter (HOSPITAL_COMMUNITY): Payer: Self-pay

## 2015-11-22 ENCOUNTER — Encounter (HOSPITAL_COMMUNITY)
Admission: RE | Admit: 2015-11-22 | Discharge: 2015-11-22 | Disposition: A | Discharge status: Home / Self Care | Payer: Medicare Other | Source: Ambulatory Visit | Attending: Ophthalmology | Admitting: Ophthalmology

## 2015-11-22 DIAGNOSIS — H269 Unspecified cataract: Secondary | ICD-10-CM | POA: Diagnosis not present

## 2015-11-22 DIAGNOSIS — Z01812 Encounter for preprocedural laboratory examination: Secondary | ICD-10-CM | POA: Diagnosis not present

## 2015-11-22 DIAGNOSIS — F17200 Nicotine dependence, unspecified, uncomplicated: Secondary | ICD-10-CM | POA: Diagnosis not present

## 2015-11-22 DIAGNOSIS — H268 Other specified cataract: Secondary | ICD-10-CM | POA: Diagnosis present

## 2015-11-22 LAB — CBC WITH DIFFERENTIAL/PLATELET
BASOS ABS: 0 10*3/uL (ref 0.0–0.1)
BASOS PCT: 0 %
BASOS PCT: 0 %
Basophils Absolute: 0.1 10*3/uL (ref 0.0–0.1)
EOS ABS: 0.4 10*3/uL (ref 0.0–0.7)
EOS PCT: 3 %
Eosinophils Absolute: 0.4 10*3/uL (ref 0.0–0.7)
Eosinophils Relative: 3 %
HCT: 49.6 % (ref 39.0–52.0)
HCT: 49.3 % (ref 39.0–52.0)
Hemoglobin: 16.9 g/dL (ref 13.0–17.0)
Hemoglobin: 17 g/dL (ref 13.0–17.0)
Lymphocytes Relative: 57 %
Lymphocytes Relative: 57 %
Lymphs Abs: 9.3 10*3/uL — ABNORMAL HIGH (ref 0.7–4.0)
Lymphs Abs: 9.3 10*3/uL — ABNORMAL HIGH (ref 0.7–4.0)
MCH: 31.9 pg (ref 26.0–34.0)
MCH: 32.4 pg (ref 26.0–34.0)
MCHC: 34.5 g/dL (ref 30.0–36.0)
MCHC: 34.1 g/dL (ref 30.0–36.0)
MCV: 93.8 fL (ref 78.0–100.0)
MCV: 94.1 fL (ref 78.0–100.0)
MONO ABS: 0.8 10*3/uL (ref 0.1–1.0)
MONOS PCT: 5 %
Monocytes Absolute: 0.8 10*3/uL (ref 0.1–1.0)
Monocytes Relative: 5 %
NEUTROS ABS: 5.9 10*3/uL (ref 1.7–7.7)
NEUTROS PCT: 35 %
Neutro Abs: 5.7 10*3/uL (ref 1.7–7.7)
Neutrophils Relative %: 36 %
PLATELETS: 167 10*3/uL (ref 150–400)
Platelets: 163 10*3/uL (ref 150–400)
RBC: 5.29 MIL/uL (ref 4.22–5.81)
RBC: 5.24 MIL/uL (ref 4.22–5.81)
RDW: 14.3 % (ref 11.5–15.5)
RDW: 14.2 % (ref 11.5–15.5)
WBC: 16.2 10*3/uL — AB (ref 4.0–10.5)
WBC: 16.3 10*3/uL — ABNORMAL HIGH (ref 4.0–10.5)

## 2015-11-22 LAB — BASIC METABOLIC PANEL
Anion gap: 11 (ref 5–15)
BUN: 27 mg/dL — ABNORMAL HIGH (ref 6–20)
CALCIUM: 10.3 mg/dL (ref 8.9–10.3)
CO2: 23 mmol/L (ref 22–32)
CREATININE: 3.53 mg/dL — AB (ref 0.61–1.24)
Chloride: 109 mmol/L (ref 101–111)
GFR calc non Af Amer: 17 mL/min — ABNORMAL LOW (ref >60)
GFR, EST AFRICAN AMERICAN: 19 mL/min — AB (ref >60)
Glucose, Bld: 87 mg/dL (ref 65–99)
Potassium: 4.3 mmol/L (ref 3.5–5.1)
SODIUM: 143 mmol/L (ref 135–145)

## 2015-11-22 NOTE — Patient Instructions (Signed)
Your procedure is scheduled on: 11/28/2015  Report to [facility] at  1   AM.  Call this number if you have problems the morning of surgery: [phone]   Do not eat food or drink liquids :After Midnight.      Take these medicines the morning of surgery with A SIP OF WATER: none   Do not wear jewelry, make-up or nail polish.  Do not wear lotions, powders, or perfumes. You may wear deodorant.  Do not shave 48 hours prior to surgery.  Do not bring valuables to the hospital.  Contacts, dentures or bridgework may not be worn into surgery.  Leave suitcase in the car. After surgery it may be brought to your room.  For patients admitted to the hospital, checkout time is 11:00 AM the day of discharge.   Patients discharged the day of surgery will not be allowed to drive home.  :     Please read over the following fact sheets that you were given: Coughing and Deep Breathing, Surgical Site Infection Prevention, Anesthesia Post-op Instructions and Care and Recovery After Surgery    Cataract A cataract is a clouding of the lens of the eye. When a lens becomes cloudy, vision is reduced based on the degree and nature of the clouding. Many cataracts reduce vision to some degree. Some cataracts make people more near-sighted as they develop. Other cataracts increase glare. Cataracts that are ignored and become worse can sometimes look white. The white color can be seen through the pupil. CAUSES   Aging. However, cataracts may occur at any age, even in newborns.   Certain drugs.   Trauma to the eye.   Certain diseases such as diabetes.   Specific eye diseases such as chronic inflammation inside the eye or a sudden attack of a rare form of glaucoma.   Inherited or acquired medical problems.  SYMPTOMS   Gradual, progressive drop in vision in the affected eye.   Severe, rapid visual loss. This most often happens when trauma is the cause.  DIAGNOSIS  To detect a cataract, an eye doctor examines  the lens. Cataracts are best diagnosed with an exam of the eyes with the pupils enlarged (dilated) by drops.  TREATMENT  For an early cataract, vision may improve by using different eyeglasses or stronger lighting. If that does not help your vision, surgery is the only effective treatment. A cataract needs to be surgically removed when vision loss interferes with your everyday activities, such as driving, reading, or watching TV. A cataract may also have to be removed if it prevents examination or treatment of another eye problem. Surgery removes the cloudy lens and usually replaces it with a substitute lens (intraocular lens, IOL).  At a time when both you and your doctor agree, the cataract will be surgically removed. If you have cataracts in both eyes, only one is usually removed at a time. This allows the operated eye to heal and be out of danger from any possible problems after surgery (such as infection or poor wound healing). In rare cases, a cataract may be doing damage to your eye. In these cases, your caregiver may advise surgical removal right away. The vast majority of people who have cataract surgery have better vision afterward. HOME CARE INSTRUCTIONS  If you are not planning surgery, you may be asked to do the following:  Use different eyeglasses.   Use stronger or brighter lighting.   Ask your eye doctor about reducing your medicine dose  or changing medicines if it is thought that a medicine caused your cataract. Changing medicines does not make the cataract go away on its own.   Become familiar with your surroundings. Poor vision can lead to injury. Avoid bumping into things on the affected side. You are at a higher risk for tripping or falling.   Exercise extreme care when driving or operating machinery.   Wear sunglasses if you are sensitive to bright light or experiencing problems with glare.  SEEK IMMEDIATE MEDICAL CARE IF:   You have a worsening or sudden vision loss.    You notice redness, swelling, or increasing pain in the eye.   You have a fever.  Document Released: 10/15/2005 Document Revised: 10/04/2011 Document Reviewed: 06/08/2011 [facility] Patient Information 2012 [place].PATIENT INSTRUCTIONS POST-ANESTHESIA  IMMEDIATELY FOLLOWING SURGERY:  Do not drive or operate machinery for the first twenty four hours after surgery.  Do not make any important decisions for twenty four hours after surgery or while taking narcotic pain medications or sedatives.  If you develop intractable nausea and vomiting or a severe headache please notify your doctor immediately.  FOLLOW-UP:  Please make an appointment with your surgeon as instructed. You do not need to follow up with anesthesia unless specifically instructed to do so.  WOUND CARE INSTRUCTIONS (if applicable):  Keep a dry clean dressing on the anesthesia/puncture wound site if there is drainage.  Once the wound has quit draining you may leave it open to air.  Generally you should leave the bandage intact for twenty four hours unless there is drainage.  If the epidural site drains for more than 36-48 hours please call the anesthesia department.  QUESTIONS?:  Please feel free to call your physician or the hospital operator if you have any questions, and they will be happy to assist you.

## 2015-11-25 MED ORDER — TETRACAINE HCL 0.5 % OP SOLN
OPHTHALMIC | Status: AC
  Filled 2015-11-25: qty 4

## 2015-11-25 MED ORDER — LIDOCAINE HCL 3.5 % OP GEL
OPHTHALMIC | Status: AC
  Filled 2015-11-25: qty 1

## 2015-11-25 MED ORDER — CYCLOPENTOLATE-PHENYLEPHRINE OP SOLN OPTIME - NO CHARGE
OPHTHALMIC | Status: AC
  Filled 2015-11-25: qty 2

## 2015-11-28 ENCOUNTER — Encounter (HOSPITAL_COMMUNITY): Payer: Self-pay | Admitting: *Deleted

## 2015-11-28 ENCOUNTER — Ambulatory Visit (HOSPITAL_COMMUNITY): Payer: Medicare Other | Admitting: Anesthesiology

## 2015-11-28 ENCOUNTER — Encounter (HOSPITAL_COMMUNITY)
Admission: RE | Disposition: A | Discharge status: Home / Self Care | Payer: Self-pay | Source: Ambulatory Visit | Attending: Ophthalmology

## 2015-11-28 ENCOUNTER — Ambulatory Visit (HOSPITAL_COMMUNITY)
Admission: RE | Admit: 2015-11-28 | Discharge: 2015-11-28 | Disposition: A | Discharge status: Home / Self Care | Payer: Medicare Other | Source: Ambulatory Visit | Attending: Ophthalmology | Admitting: Ophthalmology

## 2015-11-28 DIAGNOSIS — F17200 Nicotine dependence, unspecified, uncomplicated: Secondary | ICD-10-CM | POA: Insufficient documentation

## 2015-11-28 DIAGNOSIS — H268 Other specified cataract: Secondary | ICD-10-CM | POA: Insufficient documentation

## 2015-11-28 HISTORY — PX: CATARACT EXTRACTION W/PHACO: SHX586

## 2015-11-28 SURGERY — PHACOEMULSIFICATION, CATARACT, WITH IOL INSERTION
Anesthesia: Monitor Anesthesia Care | Site: Eye | Laterality: Right

## 2015-11-28 MED ORDER — MIDAZOLAM HCL 2 MG/2ML IJ SOLN
1.0000 mg | INTRAMUSCULAR | Status: DC | PRN
  Administered 2015-11-28: 2 mg via INTRAVENOUS

## 2015-11-28 MED ORDER — BSS IO SOLN
INTRAOCULAR | Status: AC
  Filled 2015-11-28: qty 4

## 2015-11-28 MED ORDER — FENTANYL CITRATE (PF) 100 MCG/2ML IJ SOLN
INTRAMUSCULAR | Status: AC
  Filled 2015-11-28: qty 2

## 2015-11-28 MED ORDER — BSS IO SOLN
INTRAOCULAR | Status: DC | PRN
  Administered 2015-11-28: 15 mL

## 2015-11-28 MED ORDER — FENTANYL CITRATE (PF) 100 MCG/2ML IJ SOLN
25.0000 ug | INTRAMUSCULAR | Status: AC
  Administered 2015-11-28 (×2): 25 ug via INTRAVENOUS

## 2015-11-28 MED ORDER — LIDOCAINE HCL 3.5 % OP GEL
OPHTHALMIC | Status: DC | PRN
  Administered 2015-11-28: 1 via OPHTHALMIC

## 2015-11-28 MED ORDER — LIDOCAINE HCL 3.5 % OP GEL: 1.0000 "application " | Freq: Once | OPHTHALMIC | Status: DC

## 2015-11-28 MED ORDER — LACTATED RINGERS IV SOLN
INTRAVENOUS | Status: DC
  Administered 2015-11-28: 07:00:00 via INTRAVENOUS

## 2015-11-28 MED ORDER — BSS IO SOLN
INTRAOCULAR | Status: DC | PRN
  Administered 2015-11-28: 500 mL via OPHTHALMIC

## 2015-11-28 MED ORDER — CYCLOPENTOLATE-PHENYLEPHRINE 0.2-1 % OP SOLN
1.0000 [drp] | OPHTHALMIC | Status: AC
  Administered 2015-11-28 (×3): 1 [drp] via OPHTHALMIC

## 2015-11-28 MED ORDER — MIDAZOLAM HCL 2 MG/2ML IJ SOLN
INTRAMUSCULAR | Status: AC
  Filled 2015-11-28: qty 2

## 2015-11-28 MED ORDER — TETRACAINE HCL 0.5 % OP SOLN
1.0000 [drp] | OPHTHALMIC | Status: AC
  Administered 2015-11-28 (×3): 1 [drp] via OPHTHALMIC

## 2015-11-28 MED ORDER — NA HYALUR & NA CHOND-NA HYALUR 0.55-0.5 ML IO KIT
PACK | INTRAOCULAR | Status: DC | PRN
  Administered 2015-11-28: 1 via OPHTHALMIC

## 2015-11-28 SURGICAL SUPPLY — 10 items
CLOTH BEACON ORANGE TIMEOUT ST (SAFETY) ×2 IMPLANT
GLOVE BIOGEL PI IND STRL 7.0 (GLOVE) IMPLANT
GLOVE BIOGEL PI INDICATOR 7.0 (GLOVE) ×2
GLOVE EXAM NITRILE MD LF STRL (GLOVE) ×2 IMPLANT
INST SET CATARACT ~~LOC~~ (KITS) ×3 IMPLANT
LENS IOL ACRYSOF IQ TORIC 15.0 ×2 IMPLANT
PAD ARMBOARD 7.5X6 YLW CONV (MISCELLANEOUS) ×2 IMPLANT
PROC W SPEC LENS (INTRAOCULAR LENS) ×3
PROCESS W SPEC LENS (INTRAOCULAR LENS) ×1 IMPLANT
WATER STERILE IRR 250ML POUR (IV SOLUTION) ×2 IMPLANT

## 2015-11-28 NOTE — Anesthesia Procedure Notes (Signed)
Procedure Name: MAC Date/Time: 11/28/2015 7:33 AM Performed by: Vista Deck Pre-anesthesia Checklist: Patient identified, Emergency Drugs available, Suction available, Timeout performed and Patient being monitored Patient Re-evaluated:Patient Re-evaluated prior to inductionOxygen Delivery Method: Nasal Cannula

## 2015-11-28 NOTE — H&P (Signed)
I have reviewed the pre printed H&P, the patient was re-examined, and I have identified no significant interval changes in the patient's medical condition.  There is no change in the plan of care since the history and physical of record. 

## 2015-11-28 NOTE — Op Note (Signed)
11/28/2015  8:24 AM  PATIENT:  Keith Rivera  65 y.o. male  PRE-OPERATIVE DIAGNOSIS:  nuclear cataract right eye  POST-OPERATIVE DIAGNOSIS:  nuclear cataract right eye  PROCEDURE:  Procedure(s): CATARACT EXTRACTION PHACO AND INTRAOCULAR LENS PLACEMENT (IOC)  SURGEON:  Surgeon(s): Williams Che, MD  ASSISTANTS:   Claudius Sis, CST   ANESTHESIA STAFF: Anesthesiologist: Lerry Liner, MD CRNA: Vista Deck, CRNA  ANESTHESIA:   topical and MAC  REQUESTED LENS POWER: 20.5  SN6AT4  LENS IMPLANT INFORMATION:  Alcon SN6AT4 @004  degrees  CUMULATIVE DISSIPATED ENERGY:7.14  INDICATIONS:see scanned office H&P for particulars  OP FINDINGS:mod. dense NS  COMPLICATIONS:None  PROCEDURE:  The patient was brought to the operating room in good condition.  The operative eye was prepped and draped in the usual fashion for intraocular surgery.  Lidocaine gel was dropped onto the eye.   The patient was seated upright and the horizontal limbus marked @ 0 and 180 degrees.   A 2.4 mm 10 O'clock near clear corneal stepped incision and a 12 O'clock stab incision were created.  Viscoat was instilled into the anterior chamber.  The 5 mm anterior capsulorhexis was performed with a bent needle cystotome and Utrata forceps.  The lens was hydrodissected and hydrodelineated with a cannula and balanced salt solution and rotated with a Kuglen hook.  Phacoemulsification was perfomed in the divide and conquer technique.  The remaining cortex was removed with I&A and the capsular surfaces polished as necessary.  Provisc was placed into the capsular bag and the lens inserted with the Alcon inserter.   The lens alignment marks were aligned with the previously placed limbal marks and rotated into the 004 axis. The viscoelastic was removed with I&A and the lens "rocked" into position.  The wounds were hydrated and te anterior chamber was refilled with balanced salt solution.  The wounds were checked for leakage and  rehydrated as necessary.  The lid speculum and drapes were removed and the patient was transported to short stay in good condition.  PATIENT DISPOSITION:  Short Stay

## 2015-11-28 NOTE — Anesthesia Postprocedure Evaluation (Signed)
  Anesthesia Post-op Note  Patient: Keith Rivera  Procedure(s) Performed: Procedure(s) (LRB): CATARACT EXTRACTION PHACO AND INTRAOCULAR LENS PLACEMENT (IOC) (Right)  Patient Location:  Short Stay  Anesthesia Type: MAC  Level of Consciousness: awake  Airway and Oxygen Therapy: Patient Spontanous Breathing  Post-op Pain: none  Post-op Assessment: Post-op Vital signs reviewed, Patient's Cardiovascular Status Stable, Respiratory Function Stable, Patent Airway, No signs of Nausea or vomiting and Pain level controlled  Post-op Vital Signs: Reviewed and stable  Complications: No apparent anesthesia complications

## 2015-11-28 NOTE — Transfer of Care (Signed)
Immediate Anesthesia Transfer of Care Note  Patient: Keith Rivera  Procedure(s) Performed: Procedure(s) (LRB): CATARACT EXTRACTION PHACO AND INTRAOCULAR LENS PLACEMENT (IOC) (Right)  Patient Location: Shortstay  Anesthesia Type: MAC  Level of Consciousness: awake  Airway & Oxygen Therapy: Patient Spontanous Breathing   Post-op Assessment: Report given to PACU RN, Post -op Vital signs reviewed and stable and Patient moving all extremities  Post vital signs: Reviewed and stable  Complications: No apparent anesthesia complications

## 2015-11-28 NOTE — Brief Op Note (Signed)
11/28/2015  8:24 AM  PATIENT:  Durene Romans  65 y.o. male  PRE-OPERATIVE DIAGNOSIS:  nuclear cataract right eye  POST-OPERATIVE DIAGNOSIS:  nuclear cataract right eye  PROCEDURE:  Procedure(s): CATARACT EXTRACTION PHACO AND INTRAOCULAR LENS PLACEMENT (IOC)  SURGEON:  Surgeon(s): Williams Che, MD  ASSISTANTS:   Claudius Sis, CST   ANESTHESIA STAFF: Anesthesiologist: Lerry Liner, MD CRNA: Vista Deck, CRNA  ANESTHESIA:   topical and MAC  REQUESTED LENS POWER: 20.5  SN6AT4  LENS IMPLANT INFORMATION:  Alcon SN6AT4 @004  degrees  CUMULATIVE DISSIPATED ENERGY:7.14  INDICATIONS:see scanned office H&P for particulars  OP FINDINGS:mod. dense NS  COMPLICATIONS:None  DICTATION #: none  PLAN OF CARE: as above  PATIENT DISPOSITION:  Short Stay

## 2015-11-28 NOTE — Anesthesia Preprocedure Evaluation (Signed)
Anesthesia Evaluation  Patient identified by MRN, date of birth, ID band Patient awake    Reviewed: Allergy & Precautions, H&P , NPO status , Patient's Chart, lab work & pertinent test results  Airway Mallampati: II  TM Distance: >3 FB Neck ROM: full    Dental  (+) Caps, Dental Advisory Given Right upper front lateral is bridged in:   Pulmonary Current Smoker,    Pulmonary exam normal breath sounds clear to auscultation       Cardiovascular Exercise Tolerance: Good negative cardio ROS Normal cardiovascular exam+ Valvular Problems/Murmurs MVP  Rhythm:regular Rate:Normal     Neuro/Psych negative neurological ROS  negative psych ROS   GI/Hepatic negative GI ROS, Neg liver ROS,   Endo/Other    Renal/GU Renal InsufficiencyRenal disease  negative genitourinary   Musculoskeletal   Abdominal   Peds  Hematology   Anesthesia Other Findings   Reproductive/Obstetrics negative OB ROS                             Anesthesia Physical Anesthesia Plan  ASA: II  Anesthesia Plan: MAC   Post-op Pain Management:    Induction: Intravenous  Airway Management Planned: Nasal Cannula  Additional Equipment:   Intra-op Plan:   Post-operative Plan:   Informed Consent: I have reviewed the patients History and Physical, chart, labs and discussed the procedure including the risks, benefits and alternatives for the proposed anesthesia with the patient or authorized representative who has indicated his/her understanding and acceptance.     Plan Discussed with:   Anesthesia Plan Comments:         Anesthesia Quick Evaluation

## 2015-11-29 ENCOUNTER — Encounter (HOSPITAL_COMMUNITY): Payer: Self-pay | Admitting: Ophthalmology

## 2016-02-03 IMAGING — MR MR ABDOMEN W/O CM
6 of 13 series · 17 of 48 positions shown · non-contrast
Comparison: Renal ultrasound dated 11/15/2014

CLINICAL DATA: Evaluate right renal mass on ultrasound

EXAM:
MRI ABDOMEN WITHOUT CONTRAST
TECHNIQUE: Multiplanar multisequence MR imaging was performed without the
administration of intravenous contrast.

[Series 3: T1 · axial · 4.0mm · 0.59mm/px · z∈[-102,+150]mm · 3 of 64 slices shown (1 of 5)]
[im 1/64]
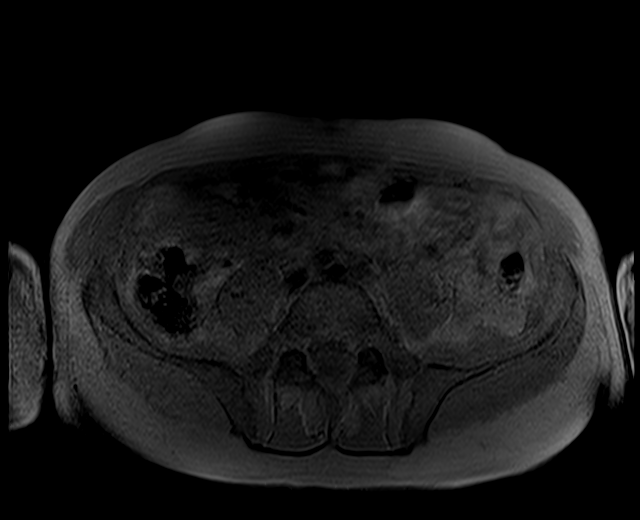
[im 32/64]
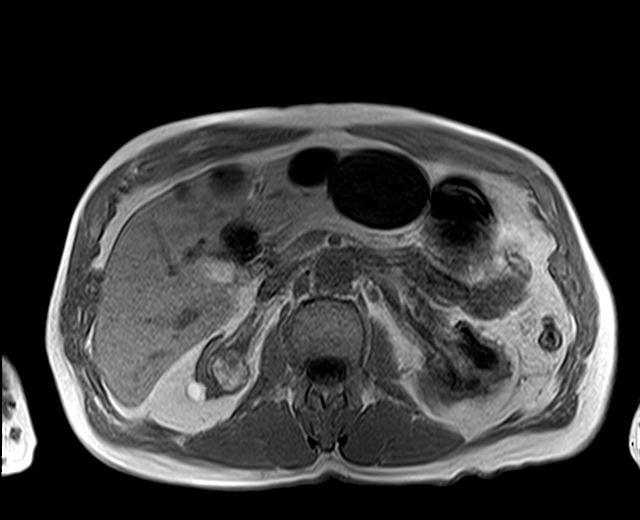
[im 64/64]
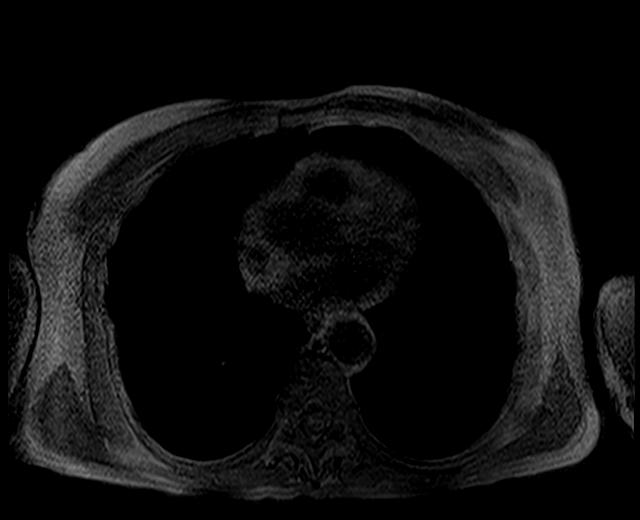

[Series 4: T1 · axial · 4.0mm · 0.59mm/px · z∈[-102,+150]mm · 3 of 64 slices shown (2 of 5)]
[im 1/64]
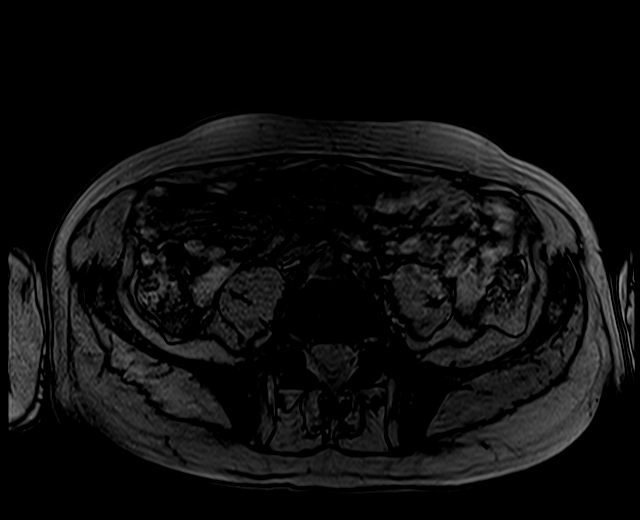
[im 32/64]
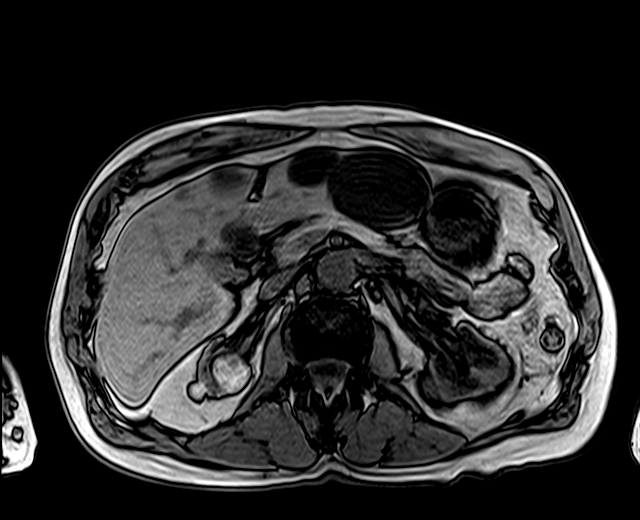
[im 64/64]
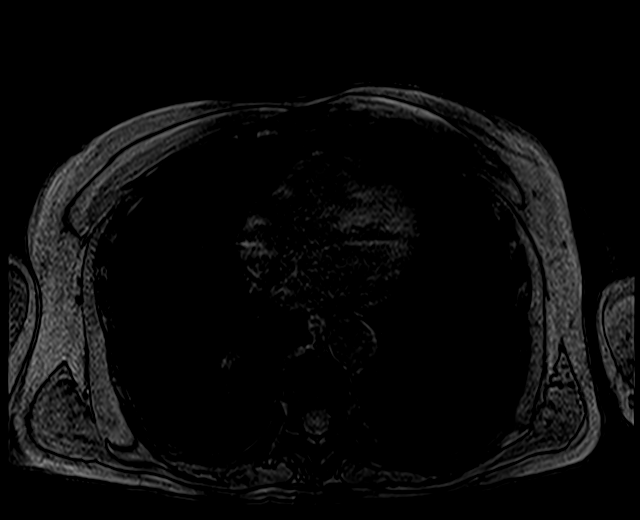

[Series 6: T1 · axial · 4.0mm · 0.59mm/px · z∈[-102,+150]mm · 4 of 64 slices shown (3 of 5)]
[im 1/64]
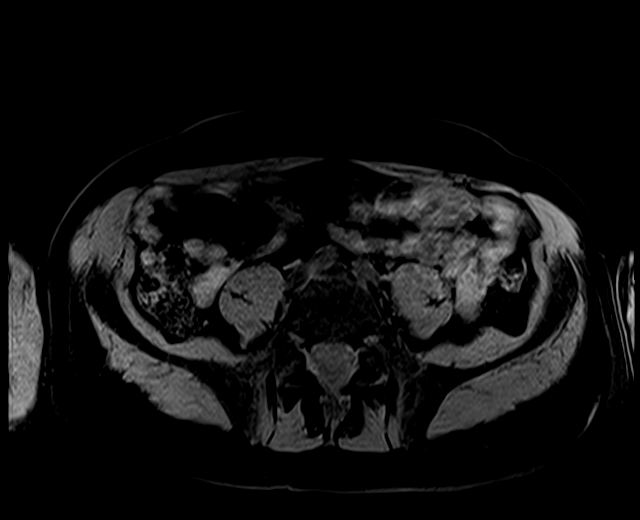
[im 22/64]
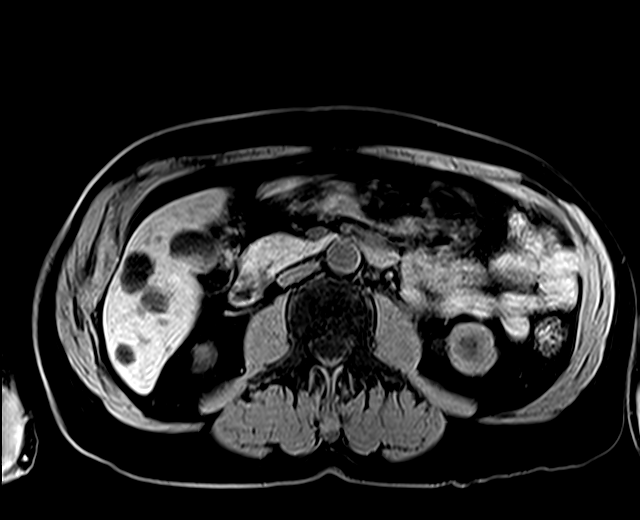
[im 43/64]
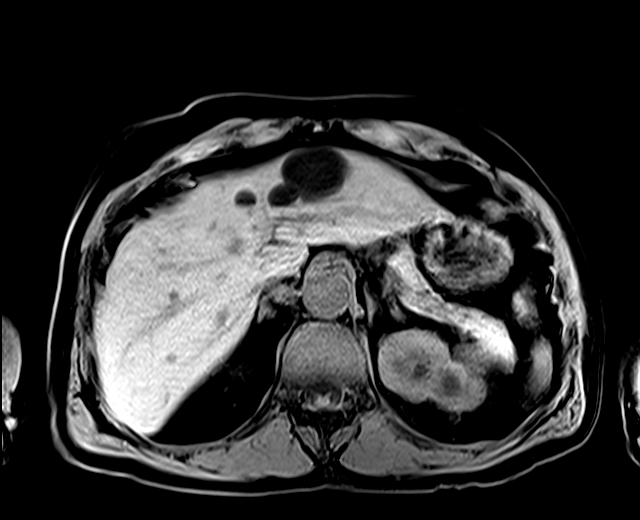
[im 64/64]
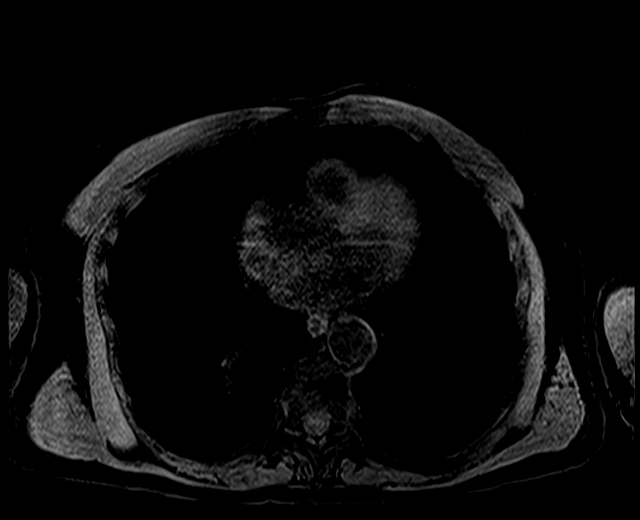

[Series 7: T1 · coronal · 5.0mm · 0.68mm/px · 2 of 30 slices shown (4 of 5)]
[im 1/30]
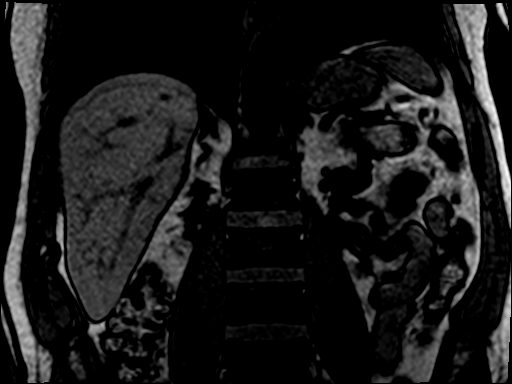
[im 30/30]
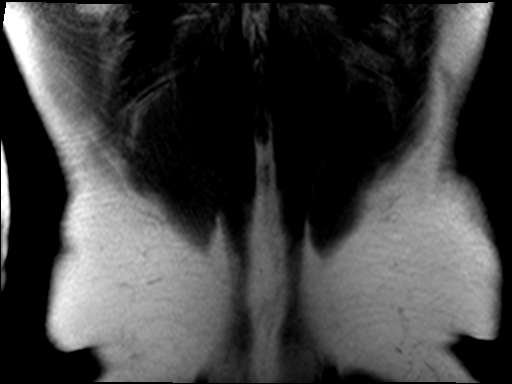

[Series 8: T1 · coronal · 5.0mm · 0.68mm/px · 4 of 60 slices shown (5 of 5)]
[im 1/60]
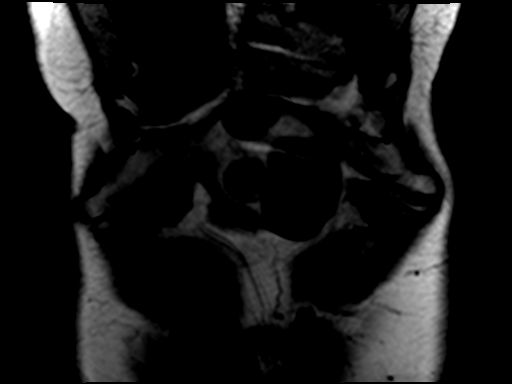
[im 20/60]
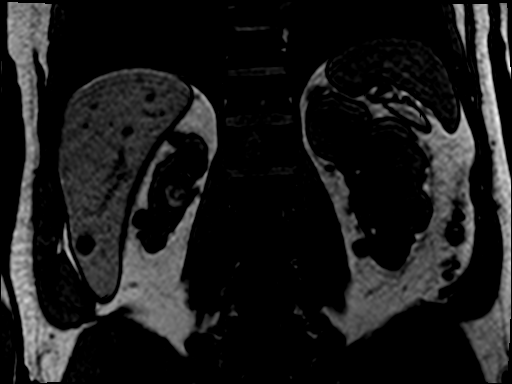
[im 40/60]
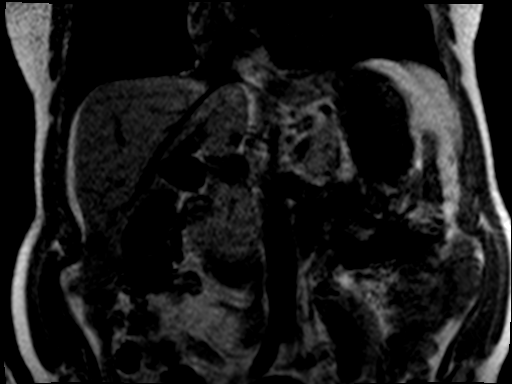
[im 60/60]
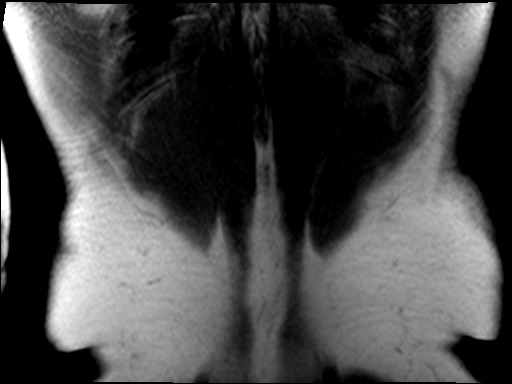

[Series 13: t1fs axial · axial · 3.0mm · 0.52mm/px · 1 of 80 slices shown]
[im 1/80]
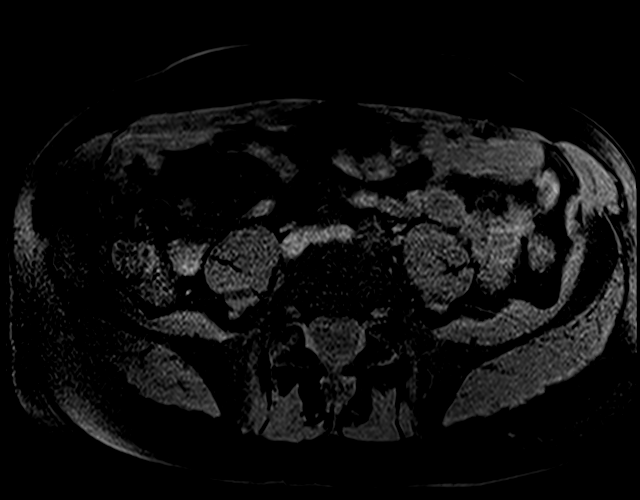

[17 of 48 positions shown; findings below may reference images not displayed]

FINDINGS: Motion degraded images.

Lower chest:  Lung bases are clear.

Hepatobiliary: Multiple hepatic cysts measuring up to 4.5 x 6.0 cm
in the lateral segment left hepatic lobe (series 18/ image 16).

Gallbladder is notable for layering sludge (series 18/image 20). No
intrahepatic or extrahepatic ductal dilatation.

Pancreas: Within normal limits.

Spleen: Within normal limits.

Adrenals/Urinary Tract: Adrenal glands are unremarkable.

Abnormal appearance of the left kidney with cortical lobulation and
scattered areas of atrophy. Scattered small cortical cysts with a
dominant 1.5 x 1.4 cm simple cyst arising from the medial left lower
pole (series 21/ image 32). Renal sinus appears unusual with
urothelial thickening (series 21/ image 23). The appearance is
likely chronic, related to scarring related to chronic
inflammation/infection, but is poorly evaluated on this study.

Right kidney is notable for severe cortical atrophy with multiple
cysts of varying sizes and complexities, including a dominant 1.2 x
1.0 cm hemorrhagic cyst arising from the lateral interpolar right
kidney (series 13/ image 43) and multiple simple cysts including a
1.4 x 1.4 cm lateral right lower pole cyst (series 21/image 26) at a
1.3 x 1.4 cm medial right upper pole cyst (series 21/image 15).

Solid-appearing ovoid mass on ultrasound likely corresponds to a
x 2.4 cm hemorrhagic lesion in the medial interpolar right kidney
(series 13/image 44; series 21/image 22) which could reflect a
hemorrhagic cyst, although hemorrhage into an underlying solid
mass/neoplasm is not slightly favored given the heterogeneity the
lesion.

Stomach/Bowel: Stomach is within normal limits.

Visualized bowel is unremarkable

Vascular/Lymphatic: No evidence of bowel aortic aneurysm.

No suspicious abdominal lymphadenopathy.

Other: No abdominal ascites.

Musculoskeletal: No focal osseous lesions
IMPRESSION: 2.4 cm hemorrhagic lesion in the medial interpolar right kidney,
corresponding to the sonographic abnormality. This is incompletely
characterized on unenhanced MRI. Hemorrhage into an underlying solid
mass or a hemorrhagic cyst both remain possible. Given the
heterogeneity of the lesion, a solid mass is slightly favored.

If this patient is dialysis dependent, consider CT abdomen
with/without contrast for further characterization. Assuming a GFR
less than 30, he would not be eligible for MR contrast.

Abnormal appearance of the left kidney with cortical lobulation and
scattered areas of atrophy. Additional urothelial thickening,
possibly reflecting chronic inflammation/ infection, poorly
evaluated.

Multiple additional right renal cysts of varying sizes and
complexities, some of which are hemorrhagic, likely benign (Bosniak
II).

## 2016-03-07 ENCOUNTER — Encounter (HOSPITAL_COMMUNITY): Payer: Medicare Other

## 2016-03-07 ENCOUNTER — Encounter (HOSPITAL_COMMUNITY): Payer: Medicare Other | Attending: Hematology & Oncology | Admitting: Hematology & Oncology

## 2016-03-07 ENCOUNTER — Encounter (HOSPITAL_COMMUNITY): Payer: Self-pay | Admitting: Hematology & Oncology

## 2016-03-07 VITALS — BP 131/97 | HR 102 | Temp 98.0°F | Resp 16 | Ht 71.0 in | Wt 176.0 lb

## 2016-03-07 DIAGNOSIS — Z85528 Personal history of other malignant neoplasm of kidney: Secondary | ICD-10-CM

## 2016-03-07 DIAGNOSIS — I341 Nonrheumatic mitral (valve) prolapse: Secondary | ICD-10-CM | POA: Diagnosis not present

## 2016-03-07 DIAGNOSIS — Z720 Tobacco use: Secondary | ICD-10-CM | POA: Diagnosis not present

## 2016-03-07 DIAGNOSIS — F17210 Nicotine dependence, cigarettes, uncomplicated: Secondary | ICD-10-CM | POA: Insufficient documentation

## 2016-03-07 DIAGNOSIS — Z9889 Other specified postprocedural states: Secondary | ICD-10-CM | POA: Diagnosis not present

## 2016-03-07 DIAGNOSIS — N400 Enlarged prostate without lower urinary tract symptoms: Secondary | ICD-10-CM | POA: Diagnosis not present

## 2016-03-07 DIAGNOSIS — N184 Chronic kidney disease, stage 4 (severe): Secondary | ICD-10-CM | POA: Diagnosis not present

## 2016-03-07 DIAGNOSIS — E785 Hyperlipidemia, unspecified: Secondary | ICD-10-CM | POA: Diagnosis not present

## 2016-03-07 DIAGNOSIS — D72829 Elevated white blood cell count, unspecified: Secondary | ICD-10-CM | POA: Insufficient documentation

## 2016-03-07 DIAGNOSIS — Z905 Acquired absence of kidney: Secondary | ICD-10-CM | POA: Insufficient documentation

## 2016-03-07 DIAGNOSIS — Z79899 Other long term (current) drug therapy: Secondary | ICD-10-CM | POA: Insufficient documentation

## 2016-03-07 DIAGNOSIS — C9110 Chronic lymphocytic leukemia of B-cell type not having achieved remission: Secondary | ICD-10-CM | POA: Diagnosis not present

## 2016-03-07 LAB — CBC WITH DIFFERENTIAL/PLATELET
Basophils Absolute: 0 10*3/uL (ref 0.0–0.1)
Basophils Relative: 0 %
EOS ABS: 0 10*3/uL (ref 0.0–0.7)
EOS PCT: 0 %
HEMATOCRIT: 47.4 % (ref 39.0–52.0)
Hemoglobin: 15.4 g/dL (ref 13.0–17.0)
LYMPHS ABS: 8.4 10*3/uL — AB (ref 0.7–4.0)
LYMPHS PCT: 56 %
MCH: 30.6 pg (ref 26.0–34.0)
MCHC: 32.5 g/dL (ref 30.0–36.0)
MCV: 94 fL (ref 78.0–100.0)
MONOS PCT: 2 %
Monocytes Absolute: 0.3 10*3/uL (ref 0.1–1.0)
Neutro Abs: 6.3 10*3/uL (ref 1.7–7.7)
Neutrophils Relative %: 42 %
Platelets: 176 10*3/uL (ref 150–400)
RBC: 5.04 MIL/uL (ref 4.22–5.81)
RDW: 14.3 % (ref 11.5–15.5)
WBC: 15.1 10*3/uL — ABNORMAL HIGH (ref 4.0–10.5)

## 2016-03-07 LAB — COMPREHENSIVE METABOLIC PANEL
ALT: 20 U/L (ref 17–63)
ANION GAP: 10 (ref 5–15)
AST: 20 U/L (ref 15–41)
Albumin: 4.1 g/dL (ref 3.5–5.0)
Alkaline Phosphatase: 62 U/L (ref 38–126)
BUN: 38 mg/dL — ABNORMAL HIGH (ref 6–20)
CO2: 24 mmol/L (ref 22–32)
Calcium: 9.6 mg/dL (ref 8.9–10.3)
Chloride: 105 mmol/L (ref 101–111)
Creatinine, Ser: 4.05 mg/dL — ABNORMAL HIGH (ref 0.61–1.24)
GFR calc Af Amer: 16 mL/min — ABNORMAL LOW (ref >60)
GFR, EST NON AFRICAN AMERICAN: 14 mL/min — AB (ref >60)
Glucose, Bld: 168 mg/dL — ABNORMAL HIGH (ref 65–99)
POTASSIUM: 4.5 mmol/L (ref 3.5–5.1)
Sodium: 139 mmol/L (ref 135–145)
TOTAL PROTEIN: 7.1 g/dL (ref 6.5–8.1)
Total Bilirubin: 0.7 mg/dL (ref 0.3–1.2)

## 2016-03-07 LAB — SEDIMENTATION RATE: SED RATE: 7 mm/h (ref 0–16)

## 2016-03-07 NOTE — Progress Notes (Signed)
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 male is here because of Leukocytosis. On chart review leukocytosis was present dating past to at least 2009. Other counts are preserved. He is a smoker, approximately 1ppd for 45 years.   He had kidney cancer and had a total nephrectomy last year. Kidney was nonfunctioning, mass was centrally located. 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  He stated that his appetite is good and his weight has been stable. He has had no drenching night sweats. He said that nothing has been out of the ordinary.   He denies any abdominal pain.   He had a colonoscopy last year.  He doesn't not have a PCP, but he is on the waiting list for Dr. Nevada Crane.   Presents today for further evaluation of his leukocytosis.                                                                                                           MEDICAL HISTORY:  Past Medical History  Diagnosis Date  . Hyperlipidemia   . History of hydronephrosis   . Chronic kidney disease   . Anemia   . MVP (mitral valve prolapse)     Mild mitral regurgitation 2014  . BPH (benign prostatic hyperplasia)     SURGICAL HISTORY: Past Surgical History  Procedure Laterality Date  . Cystoscopy  with retrograde pyelogram, ureteroscopy and stent placement Bilateral 12/03/2014    Procedure: CYSTOSCOPY WITH BILATERAL RETROGRADE PYELOGRAM, BILATERAL  URETEROSCOPY ;  Surgeon: Claybon Jabs, MD;  Location: [facility];  Service: Urology;  Laterality: Bilateral;  . Transurethral resection of prostate    . Inguinal hernia repair Left   . Vasectomy    . Nephrostomy  2006    DUE TO ENLARGED PROSTATE  . Colonoscopy N/A 02/08/2015    Procedure: COLONOSCOPY;  Surgeon: Daneil Dolin, MD;  Location: [facility];  Service: Endoscopy;  Laterality: N/A;  200  . Tonsillectomy    . Laparoscopic nephrectomy Right 02/18/2015    Procedure: LAPAROSCOPIC RIGHT RADICAL NEPHRECTOMY;  Surgeon: Ardis Hughs, MD;  Location: [facility];  Service: Urology;  Laterality: Right;  . Cataract extraction w/phaco Right 11/28/2015    Procedure: CATARACT EXTRACTION PHACO AND INTRAOCULAR LENS PLACEMENT (IOC);  Surgeon: Williams Che, MD;  Location: [facility];  Service: Ophthalmology;  Laterality: Right;  CDE: 7.14    SOCIAL HISTORY: Social History   Social History  .  Marital Status: Married    Spouse Name: N/A  . Number of Children: N/A  . Years of Education: N/A   Occupational History  . Not on file.   Social History Main Topics  . Smoking status: Current Every Day Smoker -- 1.00 packs/day for 40 years    Types: Cigarettes  . Smokeless tobacco: Never Used  . Alcohol Use: 0.0 oz/week    0 Standard drinks or equivalent per week     Comment: Rare  . Drug Use: No  . Sexual Activity: No   Other Topics Concern  . Not on file   Social History Narrative  Married for 42 years 2 dogs no kids Smokes pack a day; started at 35 years old Doesn't drink ETOH normally Hobbies: firefighter Works as a Writer in [place]: Family History  Problem Relation Age of Onset  . Heart attack Father     Died age 19  . Colon cancer Maternal Grandfather     In his 24s  Mother died at 70 from  pneumonia. Father died at 46 from heart attack. 1 sister-Healthy Grandfather on Mother side died of colon cancer.   ALLERGIES:  has No Known Allergies.  MEDICATIONS:  Current Outpatient Prescriptions  Medication Sig Dispense Refill  . atorvastatin (LIPITOR) 20 MG tablet Take 20 mg by mouth at bedtime.     . cholecalciferol (VITAMIN D) 1000 UNITS tablet Take 1,000 Units by mouth every morning.     . Coenzyme Q10 (CO Q10 PO) Take 1 tablet by mouth at bedtime.     Marland Kitchen doxycycline (VIBRA-TABS) 100 MG tablet Take 100 mg by mouth 2 (two) times daily.    . Multiple Vitamin (MULTIVITAMIN WITH MINERALS) TABS tablet Take 1 tablet by mouth every morning.     . predniSONE (STERAPRED UNI-PAK 48 TAB) 5 MG (48) TBPK tablet Taper pack    . sodium bicarbonate 650 MG tablet Take 650 mg by mouth 2 (two) times daily.    . tamsulosin (FLOMAX) 0.4 MG CAPS capsule Take 1 capsule by mouth daily.     No current facility-administered medications for this visit.    Review of Systems  Constitutional: Negative.  Negative for fever, chills, weight loss and malaise/fatigue.  HENT: Negative.  Negative for congestion, hearing loss, nosebleeds, sore throat and tinnitus.   Eyes: Negative.  Negative for blurred vision, double vision, pain and discharge.  Respiratory: Negative.  Negative for cough, hemoptysis, sputum production, shortness of breath and wheezing.   Cardiovascular: Negative.  Negative for chest pain, palpitations, claudication, leg swelling and PND.  Gastrointestinal: Negative.  Negative for heartburn, nausea, vomiting, abdominal pain, diarrhea, constipation, blood in stool and melena.  Genitourinary: Negative.  Negative for dysuria, urgency, frequency and hematuria.  Musculoskeletal: Negative.  Negative for myalgias, joint pain and falls.  Skin: Negative.  Negative for itching and rash.  Neurological: Negative.  Negative for dizziness, tingling, tremors, sensory change, speech change, focal weakness,  seizures, loss of consciousness, weakness and headaches.  Endo/Heme/Allergies: Negative.  Does not bruise/bleed easily.  Psychiatric/Behavioral: Negative.  Negative for depression, suicidal ideas, memory loss and substance abuse. The patient is not nervous/anxious and does not have insomnia.   All other systems reviewed and are negative. 14 point ROS was done and is otherwise as detailed above or in HPI   PHYSICAL EXAMINATION: ECOG PERFORMANCE STATUS: 0 - Asymptomatic  Filed Vitals:   03/07/16 1409  BP: 131/97  Pulse: 102  Temp: 98 F (36.7  C)  Resp: 16   Filed Weights   03/07/16 1409  Weight: 176 lb (79.833 kg)   Physical Exam  Constitutional: He is oriented to person, place, and time and well-developed, well-nourished, and in no distress.  HENT:  Head: Normocephalic and atraumatic.  Nose: Nose normal.  Mouth/Throat: Oropharynx is clear and moist. No oropharyngeal exudate.  Eyes: Conjunctivae and EOM are normal. Pupils are equal, round, and reactive to light. Right eye exhibits no discharge. Left eye exhibits no discharge. No scleral icterus.  Neck: Normal range of motion. Neck supple. No tracheal deviation present. No thyromegaly present.  Cardiovascular: Normal rate, regular rhythm and normal heart sounds.  Exam reveals no gallop and no friction rub.   No murmur heard. Pulmonary/Chest: Effort normal and breath sounds normal. He has no wheezes. He has no rales.  Abdominal: Soft. Bowel sounds are normal. He exhibits no distension and no mass. There is no tenderness. There is no rebound and no guarding.  Musculoskeletal: Normal range of motion. He exhibits no edema.  Lymphadenopathy:    He has no cervical adenopathy.  Neurological: He is alert and oriented to person, place, and time. He has normal reflexes. No cranial nerve deficit. Gait normal. Coordination normal.  Skin: Skin is warm and dry. No rash noted.  Psychiatric: Mood, memory, affect and judgment normal.  Nursing  note and vitals reviewed.   LABORATORY DATA:  I have reviewed the data as listed Lab Results  Component Value Date   WBC 16.3* 11/22/2015   WBC 16.2* 11/22/2015   HGB 17.0 11/22/2015   HGB 16.9 11/22/2015   HCT 49.3 11/22/2015   HCT 49.6 11/22/2015   MCV 94.1 11/22/2015   MCV 93.8 11/22/2015   PLT 163 11/22/2015   PLT 167 11/22/2015   CMP     Component Value Date/Time   NA 143 11/22/2015 1020   K 4.3 11/22/2015 1020   CL 109 11/22/2015 1020   CO2 23 11/22/2015 1020   GLUCOSE 87 11/22/2015 1020   BUN 27* 11/22/2015 1020   CREATININE 3.53* 11/22/2015 1020   CALCIUM 10.3 11/22/2015 1020   PROT 7.6 02/15/2015 1040   ALBUMIN 4.6 02/15/2015 1040   AST 29 02/15/2015 1040   ALT 24 02/15/2015 1040   ALKPHOS 73 02/15/2015 1040   BILITOT 1.3* 02/15/2015 1040   GFRNONAA 17* 11/22/2015 1020   GFRAA 19* 11/22/2015 1020   RADIOGRAPHIC STUDIES: I have personally reviewed the radiological images as listed and agreed with the findings in the report.   Study Result     CLINICAL DATA: Renal cell carcinoma.  EXAM: CHEST 2 VIEW  COMPARISON: 01/03/2015.  FINDINGS: Mediastinum and hilar structures normal. Lungs are clear. No focal pulmonary abnormality identified Heart size normal. No pleural effusion or pneumothorax. No acute bony abnormality.  IMPRESSION: No acute cardiopulmonary disease. No focal pulmonary abnormality identified.   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 Have recommended  proceeding with peripheral smear review and other studies detailed below including flow cytometry to evaluate for clonal lymphocyte proliferation (CLL) and rule out potential other causes. (  CLL is working differential at this time)   Orders Placed This Encounter  Procedures  . CBC with Differential  . Comprehensive metabolic panel  . Pathologist smear review  . Sedimentation rate  . JAK2 V617F, Rfx CALR/E12/MPL  . BCR-ABL1, CML/ALL, PCR, QUANT   Discussed with the patient potential causes of leukocytosis including chronic leukemias and reactive due to an infectious or non-infectious causes, such as drug reactions, stress, unexplained polyclonal lymphocytosis. Given the duration of his leukocytosis I would favor one of these.   He will return in 2 weeks to review labs obtained today and for further discussion and recommendations.   I addressed the importance of smoking cessation with the patient in detail.  We discussed the health benefits of cessation.  We discussed the health detriments of ongoing tobacco use including but not limited to COPD, heart disease and malignancy. We reviewed the multiple options for cessation  such as chantix, wellbutrin. We will continue to address this moving forward.  All questions were answered. The patient knows to call the clinic with any problems, questions or concerns.  This document serves as a record of services personally performed by Ancil Linsey, MD. It was created on her behalf by Kandace Blitz, a trained medical scribe. The creation of this record is based on the scribe's personal observations and the provider's statements to them. This document has been checked and approved by the attending provider.  I have reviewed the above documentation for accuracy and completeness, and I agree with the above.  This note was electronically signed.    Molli Hazard, MD  03/07/2016 3:04 PM

## 2016-03-07 NOTE — Patient Instructions (Signed)
[facility] at [facility] Discharge Instructions  RECOMMENDATIONS MADE BY THE CONSULTANT AND ANY TEST RESULTS WILL BE SENT TO YOUR REFERRING PHYSICIAN.  Labs today Return in 2 weeks to see Dr. Whitney Muse  Thank you for choosing [place] at [facility] to provide your oncology and hematology care.  To afford each patient quality time with our provider, please arrive at least 15 minutes before your scheduled appointment time.   Beginning January 23rd 2017 lab work for the [facility] will be done in the  Main lab at [facility] on 1st floor. If you have a lab appointment with the [facility] please come in thru the  Main Entrance and check in at the main information desk  You need to re-schedule your appointment should you arrive 10 or more minutes late.  We strive to give you quality time with our providers, and arriving late affects you and other patients whose appointments are after yours.  Also, if you no show three or more times for appointments you may be dismissed from the clinic at the providers discretion.     Again, thank you for choosing [facility]  Our hope is that these requests will decrease the amount of time that you wait before being seen by our physicians.       _____________________________________________________________  Should you have questions after your visit to [facility], please contact our office at [phone] between the hours of 8:30 a.m. and 4:30 p.m.  Voicemails left after 4:30 p.m. will not be returned until the following business day.  For prescription refill requests, have your pharmacy contact our office.         Resources For Cancer Patients and their Caregivers ? American Cancer Society: Can assist with transportation, wigs, general needs, runs Look Good Feel Better.        307-426-2215 ? Cancer Care: Provides financial assistance, online support groups,  medication/co-pay assistance.  1-800-813-HOPE 229-067-2340) ? [facility] Assists [place] cancer patients and their families through emotional , educational and financial support.  (650)354-8876 ? [place] DSS Where to apply for food stamps, Medicaid and utility assistance. (406) 317-8801 ? [facility]: Transportation to medical appointments. 360 597 5156 ? Social Security Administration: May apply for disability if have a Stage IV cancer. (636)246-6166 (319) 569-9079 ? [employer], Disability and Transit Services: Assists with nutrition, care and transit needs. [place] Support Programs: @10RELATIVEDAYS @ > Cancer Support Group  2nd Tuesday of the month 1pm-2pm, Journey Room  > Creative Journey  3rd Tuesday of the month 1130am-1pm, Journey Room  > Look Good Feel Better  1st Wednesday of the month 10am-12 noon, Journey Room (Call [facility] to register [phone])

## 2016-03-08 LAB — PATHOLOGIST SMEAR REVIEW

## 2016-03-19 LAB — CALR + JAK2 E12-15 + MPL (REFLEXED)

## 2016-03-19 LAB — JAK2 V617F, W REFLEX TO CALR/E12/MPL

## 2016-03-19 LAB — BCR-ABL1, CML/ALL, PCR, QUANT

## 2016-03-22 ENCOUNTER — Encounter (HOSPITAL_COMMUNITY): Payer: Self-pay | Admitting: Hematology & Oncology

## 2016-03-22 ENCOUNTER — Encounter (HOSPITAL_BASED_OUTPATIENT_CLINIC_OR_DEPARTMENT_OTHER): Payer: Medicare Other | Admitting: Hematology & Oncology

## 2016-03-22 VITALS — BP 131/91 | HR 72 | Temp 97.9°F | Resp 16 | Wt 173.2 lb

## 2016-03-22 DIAGNOSIS — N184 Chronic kidney disease, stage 4 (severe): Secondary | ICD-10-CM | POA: Diagnosis not present

## 2016-03-22 DIAGNOSIS — Z720 Tobacco use: Secondary | ICD-10-CM

## 2016-03-22 DIAGNOSIS — C9110 Chronic lymphocytic leukemia of B-cell type not having achieved remission: Secondary | ICD-10-CM | POA: Diagnosis not present

## 2016-03-22 DIAGNOSIS — Z85528 Personal history of other malignant neoplasm of kidney: Secondary | ICD-10-CM

## 2016-03-22 NOTE — Progress Notes (Signed)
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 male is here because of Leukocytosis. On chart review leukocytosis was present dating past to at least 2009. Other counts are preserved. He is a smoker, approximately 1ppd for 45 years. He has a diagnosis of CLL.  He had kidney cancer and had a total nephrectomy last year. Kidney was nonfunctioning, mass was centrally located. 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  [name] returns to the [facility] today unaccompanied.  His questions during the appointment mainly pertain to his diagnosis and informational materials were reviewed with him at length.  He remarks, overall, "so it means I'm good to go? As good as I can be with all of the rest of the crap I have." He notes that he's mainly concerned that his wife will freak out.  We also discussed how he can talk to Dr. Florene Glen up at [facility] about anything he wishes, for a second opinion.  He would like a copy of his bloodwork today.  He denies any B symptoms. No weight loss. Feels well overall.                                                                                                           MEDICAL HISTORY:  Past Medical History    Diagnosis Date  . Hyperlipidemia   . History of hydronephrosis   . Chronic kidney disease   . Anemia   . MVP (mitral valve prolapse)     Mild mitral regurgitation 2014  . BPH (benign prostatic hyperplasia)     SURGICAL HISTORY: Past Surgical History  Procedure Laterality Date  . Cystoscopy with retrograde pyelogram, ureteroscopy and stent placement Bilateral 12/03/2014    Procedure: CYSTOSCOPY WITH BILATERAL RETROGRADE PYELOGRAM, BILATERAL  URETEROSCOPY ;  Surgeon: Claybon Jabs, MD;  Location: [facility];  Service: Urology;  Laterality: Bilateral;  . Transurethral resection of prostate    . Inguinal hernia repair Left   . Vasectomy    . Nephrostomy  2006    DUE TO ENLARGED PROSTATE  . Colonoscopy N/A 02/08/2015    Procedure: COLONOSCOPY;  Surgeon: Daneil Dolin, MD;  Location: [facility];  Service: Endoscopy;  Laterality: N/A;  200  . Tonsillectomy    . Laparoscopic nephrectomy Right 02/18/2015    Procedure: LAPAROSCOPIC RIGHT RADICAL NEPHRECTOMY;  Surgeon: Ardis Hughs, MD;  Location: [facility];  Service:  Urology;  Laterality: Right;  . Cataract extraction w/phaco Right 11/28/2015    Procedure: CATARACT EXTRACTION PHACO AND INTRAOCULAR LENS PLACEMENT (IOC);  Surgeon: Williams Che, MD;  Location: [facility];  Service: Ophthalmology;  Laterality: Right;  CDE: 7.14    SOCIAL HISTORY: Social History   Social History  . Marital Status: Married    Spouse Name: N/A  . Number of Children: N/A  . Years of Education: N/A   Occupational History  . Not on file.   Social History Main Topics  . Smoking status: Current Every Day Smoker -- 1.00 packs/day for 40 years    Types: Cigarettes  . Smokeless tobacco: Never Used  . Alcohol Use: 0.0 oz/week    0 Standard drinks or equivalent per week     Comment: Rare  . Drug Use: No  . Sexual Activity: No   Other Topics Concern  . Not on file   Social History Narrative  Married for 42 years 2 dogs no kids Smokes pack a day; started  at 1 years old Doesn't drink ETOH normally Hobbies: firefighter Works as a Writer in [place]: Family History  Problem Relation Age of Onset  . Heart attack Father     Died age 41  . Colon cancer Maternal Grandfather     In his 21s  Mother died at 48 from pneumonia. Father died at 98 from heart attack. 1 sister-Healthy Grandfather on Mother side died of colon cancer.   ALLERGIES:  has No Known Allergies.  MEDICATIONS:  Current Outpatient Prescriptions  Medication Sig Dispense Refill  . atorvastatin (LIPITOR) 40 MG tablet Take 40 mg by mouth daily.    . cholecalciferol (VITAMIN D) 1000 UNITS tablet Take 1,000 Units by mouth every morning.     . Coenzyme Q10 (CO Q10 PO) Take 1 tablet by mouth at bedtime.     . Multiple Vitamin (MULTIVITAMIN WITH MINERALS) TABS tablet Take 1 tablet by mouth every morning.     . sodium bicarbonate 650 MG tablet Take 650 mg by mouth 2 (two) times daily.    . tamsulosin (FLOMAX) 0.4 MG CAPS capsule Take 1 capsule by mouth daily.     No current facility-administered medications for this visit.    Review of Systems  Constitutional: Negative.  Negative for fever, chills, weight loss and malaise/fatigue.  HENT: Negative.  Negative for congestion, hearing loss, nosebleeds, sore throat and tinnitus.   Eyes: Negative.  Negative for blurred vision, double vision, pain and discharge.  Respiratory: Negative.  Negative for cough, hemoptysis, sputum production, shortness of breath and wheezing.   Cardiovascular: Negative.  Negative for chest pain, palpitations, claudication, leg swelling and PND.  Gastrointestinal: Negative.  Negative for heartburn, nausea, vomiting, abdominal pain, diarrhea, constipation, blood in stool and melena.  Genitourinary: Negative.  Negative for dysuria, urgency, frequency and hematuria.  Musculoskeletal: Negative.  Negative for myalgias, joint pain and falls.  Skin: Negative.  Negative for  itching and rash.  Neurological: Negative.  Negative for dizziness, tingling, tremors, sensory change, speech change, focal weakness, seizures, loss of consciousness, weakness and headaches.  Endo/Heme/Allergies: Negative.  Does not bruise/bleed easily.  Psychiatric/Behavioral: Negative.  Negative for depression, suicidal ideas, memory loss and substance abuse. The patient is not nervous/anxious and does not have insomnia.   All other systems reviewed and are negative. 14 point ROS was done and is otherwise as detailed above or in HPI    PHYSICAL EXAMINATION: ECOG PERFORMANCE STATUS: 0 -  Asymptomatic  Filed Vitals:   03/22/16 1027  BP: 131/91  Pulse: 72  Temp: 97.9 F (36.6 C)  Resp: 16   Filed Weights   03/22/16 1027  Weight: 173 lb 3.2 oz (78.563 kg)   Physical Exam  Constitutional: He is oriented to person, place, and time and well-developed, well-nourished, and in no distress.  HENT:  Head: Normocephalic and atraumatic.  Nose: Nose normal.  Mouth/Throat: Oropharynx is clear and moist. No oropharyngeal exudate.  Eyes: Conjunctivae and EOM are normal. Pupils are equal, round, and reactive to light. Right eye exhibits no discharge. Left eye exhibits no discharge. No scleral icterus.  Neck: Normal range of motion. Neck supple. No tracheal deviation present. No thyromegaly present.  Cardiovascular: Normal rate, regular rhythm and normal heart sounds.  Exam reveals no gallop and no friction rub.   No murmur heard. Pulmonary/Chest: Effort normal and breath sounds normal. He has no wheezes. He has no rales.  Abdominal: Soft. Bowel sounds are normal. He exhibits no distension and no mass. There is no tenderness. There is no rebound and no guarding.  Musculoskeletal: Normal range of motion. He exhibits no edema.  Lymphadenopathy:    He has no cervical adenopathy.  Neurological: He is alert and oriented to person, place, and time. He has normal reflexes. No cranial nerve deficit. Gait  normal. Coordination normal.  Skin: Skin is warm and dry. No rash noted.  Psychiatric: Mood, memory, affect and judgment normal.  Nursing note and vitals reviewed.   LABORATORY DATA:  I have reviewed the data as listed Lab Results  Component Value Date   WBC 15.1* 03/07/2016   HGB 15.4 03/07/2016   HCT 47.4 03/07/2016   MCV 94.0 03/07/2016   PLT 176 03/07/2016   CMP     Component Value Date/Time   NA 139 03/07/2016 1534   K 4.5 03/07/2016 1534   CL 105 03/07/2016 1534   CO2 24 03/07/2016 1534   GLUCOSE 168* 03/07/2016 1534   BUN 38* 03/07/2016 1534   CREATININE 4.05* 03/07/2016 1534   CALCIUM 9.6 03/07/2016 1534   PROT 7.1 03/07/2016 1534   ALBUMIN 4.1 03/07/2016 1534   AST 20 03/07/2016 1534   ALT 20 03/07/2016 1534   ALKPHOS 62 03/07/2016 1534   BILITOT 0.7 03/07/2016 1534   GFRNONAA 14* 03/07/2016 1534   GFRAA 16* 03/07/2016 1534       RADIOGRAPHIC STUDIES: I have personally reviewed the radiological images as listed and agreed with the findings in the report.   Study Result     CLINICAL DATA: Renal cell carcinoma.  EXAM: CHEST 2 VIEW  COMPARISON: 01/03/2015.  FINDINGS: Mediastinum and hilar structures normal. Lungs are clear. No focal pulmonary abnormality identified Heart size normal. No pleural effusion or pneumothorax. No acute bony abnormality.  IMPRESSION: No acute cardiopulmonary disease. No focal pulmonary abnormality identified.   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   I discussed the following as detailed from up to date below:  Not all patients with  CLL require treatment at the time of diagnosis. This is principally because: ?CLL is an extremely heterogeneous disease with certain subsets of patients having survival rates without treatment that are similar to the normal population  persons with asymptomatic CLL that displays poor-risk prognostic markers can have increased psychological stress that affects their emotional well-being and creates a push to start therapeutic interventions right away, there is no evidence that earlier treatment benefits the patient. Until studies demonstrate a benefit, early treatment of asymptomatic persons should be reserved for patients enrolled on these clinical trials. We do routinely perform FISH for del17p, del11q, trisomy 12, and del13q in asymptomatic patients at the time of diagnosis to predict time to progression. Treatment of the underlying CLL is indicated for patients who develop disease-related symptoms or evidence of progressive disease, termed "active disease." With  Therapy is indicated for patients with the following disease-related complications, usually termed "active disease" ?Weakness, night sweats, weight loss, fever, progressive increase in the size of lymph nodes, or pain associated with enlarged lymph nodes.  ?Symptomatic anemia and/or thrombocytopenia (Rai stages III or IV; Binet stage C) Autoimmune mediated anemia or thrombocytopenia is treated with therapy directed at the autoimmune process before treatment of the underlying CLL is initiated.  ?Autoimmune hemolytic anemia and/or thrombocytopenia inadequately responsive to corticosteroid therapy.  ?Progressive disease, as demonstrated by increasing lymphocytosis with a lymphocyte doubling time less than six months, and/or rapidly enlarging lymph nodes, spleen, and liver. In contrast, transient localized  lymphadenopathy, occurring in response to localized infections, is not necessarily an indication for initiating treatment. ?Repeated episodes of infection. Hypogammaglobulinemia without repeated episodes of infection is not a clear indication for therapy. (Lymphocytosis itself, even if extreme, is not a strict indication for treatment if patients have no symptoms and adequate bone marrow function. However, in our experience, extreme lymphocytosis (leukocyte counts >200,000/microL) is associated with an increased risk of hyperviscosity syndrome and related complications   He has significant CKD, stage IV but normal H/H. I am not sure if this is secondary to his tobacco use and "seconary polycythemia."  I did an MPD evaluation at his initial visit and found a CALR mutation:  CALR mutations have been observed in approximately 70 percent of patients with ET or PMF who do not carry a mutation in either JAK2 or MPL, and have only rarely been observed in patients with PV; all known CALR mutations involve frameshift mutations within exon 9 that generate a mutant protein with a novel C-terminus  The significance of JAK2, MPL, CALR, and other mutations in the genesis of the MPNs as well as their relative roles in determining disease phenotype, leukemic transformation, and the level of involvement of stem cells in these disorders are unclear at present   Note it may be of uncertain significance at this point.   I did offer the patient a second opinion at [facility] with Dr. Florene Glen. I am going to see him back in 4 weeks to discuss further. We will address the CALR mutation at that point.   I will see him back in a month for follow-up to go over questions to see what concerns he has.  He was provided with a copy of his blood work. He was given reading information from the leukemia and lymphoma society, up to date and the NCCN.  I have currently recommended observation.  All questions were answered. The patient knows  to call the clinic with any problems, questions or concerns.  This document serves as a record  of services personally performed by Ancil Linsey, MD. It was created on her behalf by Toni Amend, a trained medical scribe. The creation of this record is based on the scribe's personal observations and the provider's statements to them. This document has been checked and approved by the attending provider.  I have reviewed the above documentation for accuracy and completeness, and I agree with the above.  This note was electronically signed.  Molli Hazard, MD  03/25/2016 5:57 PM

## 2016-03-22 NOTE — Patient Instructions (Signed)
[place] at [facility] Discharge Instructions  RECOMMENDATIONS MADE BY THE CONSULTANT AND ANY TEST RESULTS WILL BE SENT TO YOUR REFERRING PHYSICIAN.  We discussed today that you have CLL. I have recommended watching and waiting or active observation. I would like to see you back in one month to review information. Please call in the interim with any questions or concerns. I also offered a second opinion with Dr. Florene Glen at [facility].  Thank you for choosing [place] at [facility] to provide your oncology and hematology care.  To afford each patient quality time with our provider, please arrive at least 15 minutes before your scheduled appointment time.   Beginning January 23rd 2017 lab work for the [facility] will be done in the  Main lab at [facility] on 1st floor. If you have a lab appointment with the [facility] please come in thru the  Main Entrance and check in at the main information desk  You need to re-schedule your appointment should you arrive 10 or more minutes late.  We strive to give you quality time with our providers, and arriving late affects you and other patients whose appointments are after yours.  Also, if you no show three or more times for appointments you may be dismissed from the clinic at the providers discretion.     Again, thank you for choosing [facility].  Our hope is that these requests will decrease the amount of time that you wait before being seen by our physicians.       _____________________________________________________________  Should you have questions after your visit to [facility], please contact our office at [phone] between the hours of 8:30 a.m. and 4:30 p.m.  Voicemails left after 4:30 p.m. will not be returned until the following business day.  For prescription refill requests, have your pharmacy contact our office.         Resources For Cancer Patients  and their Caregivers ? American Cancer Society: Can assist with transportation, wigs, general needs, runs Look Good Feel Better.        ([phone] 268-4855 ? Cancer Care: Provides financial assistance, online support groups, medication/co-pay assistance.  1-800-813-HOPE 843-887-4616) ? [employer] Assists [place] cancer patients and their families through emotional , educational and financial support.  502-324-6637 ? [place] DSS Where to apply for food stamps, Medicaid and utility assistance. (551) 451-5970 ? RCATS: Transportation to medical appointments. (743) 021-0282 ? Social Security Administration: May apply for disability if have a Stage IV cancer. 220-676-7485 (206) 051-3346 ? [employer], Disability and Transit Services: Assists with nutrition, care and transit needs. [place] Support Programs: @10RELATIVEDAYS @ > Cancer Support Group  2nd Tuesday of the month 1pm-2pm, Journey Room  > Creative Journey  3rd Tuesday of the month 1130am-1pm, Journey Room  > Look Good Feel Better  1st Wednesday of the month 10am-12 noon, Journey Room (Call [place] to register [phone])

## 2016-04-27 ENCOUNTER — Ambulatory Visit (HOSPITAL_COMMUNITY): Payer: Medicare Other | Admitting: Hematology & Oncology

## 2016-05-04 ENCOUNTER — Encounter (HOSPITAL_COMMUNITY): Payer: Medicare Other | Admitting: Hematology & Oncology

## 2016-05-22 ENCOUNTER — Other Ambulatory Visit: Payer: Self-pay

## 2016-05-22 ENCOUNTER — Encounter (HOSPITAL_COMMUNITY): Payer: Medicare Other

## 2016-05-22 ENCOUNTER — Encounter (HOSPITAL_COMMUNITY): Payer: Medicare Other | Attending: Hematology & Oncology | Admitting: Hematology & Oncology

## 2016-05-22 ENCOUNTER — Encounter (HOSPITAL_COMMUNITY): Payer: Self-pay | Admitting: Hematology & Oncology

## 2016-05-22 VITALS — BP 117/71 | HR 85 | Temp 98.3°F | Resp 16 | Wt 174.0 lb

## 2016-05-22 DIAGNOSIS — Z9889 Other specified postprocedural states: Secondary | ICD-10-CM | POA: Insufficient documentation

## 2016-05-22 DIAGNOSIS — E785 Hyperlipidemia, unspecified: Secondary | ICD-10-CM | POA: Insufficient documentation

## 2016-05-22 DIAGNOSIS — Z79899 Other long term (current) drug therapy: Secondary | ICD-10-CM | POA: Diagnosis not present

## 2016-05-22 DIAGNOSIS — Z720 Tobacco use: Secondary | ICD-10-CM

## 2016-05-22 DIAGNOSIS — I341 Nonrheumatic mitral (valve) prolapse: Secondary | ICD-10-CM | POA: Insufficient documentation

## 2016-05-22 DIAGNOSIS — C9110 Chronic lymphocytic leukemia of B-cell type not having achieved remission: Secondary | ICD-10-CM | POA: Insufficient documentation

## 2016-05-22 DIAGNOSIS — D72829 Elevated white blood cell count, unspecified: Secondary | ICD-10-CM | POA: Diagnosis not present

## 2016-05-22 DIAGNOSIS — N400 Enlarged prostate without lower urinary tract symptoms: Secondary | ICD-10-CM | POA: Diagnosis not present

## 2016-05-22 DIAGNOSIS — Z905 Acquired absence of kidney: Secondary | ICD-10-CM | POA: Diagnosis not present

## 2016-05-22 DIAGNOSIS — F17210 Nicotine dependence, cigarettes, uncomplicated: Secondary | ICD-10-CM | POA: Diagnosis not present

## 2016-05-22 DIAGNOSIS — I493 Ventricular premature depolarization: Secondary | ICD-10-CM

## 2016-05-22 DIAGNOSIS — N184 Chronic kidney disease, stage 4 (severe): Secondary | ICD-10-CM | POA: Insufficient documentation

## 2016-05-22 DIAGNOSIS — Z85528 Personal history of other malignant neoplasm of kidney: Secondary | ICD-10-CM

## 2016-05-22 LAB — COMPREHENSIVE METABOLIC PANEL
ALK PHOS: 60 U/L (ref 38–126)
ALT: 19 U/L (ref 17–63)
ANION GAP: 8 (ref 5–15)
AST: 21 U/L (ref 15–41)
Albumin: 4.2 g/dL (ref 3.5–5.0)
BILIRUBIN TOTAL: 0.7 mg/dL (ref 0.3–1.2)
BUN: 27 mg/dL — ABNORMAL HIGH (ref 6–20)
CALCIUM: 9.6 mg/dL (ref 8.9–10.3)
CO2: 24 mmol/L (ref 22–32)
Chloride: 107 mmol/L (ref 101–111)
Creatinine, Ser: 3.27 mg/dL — ABNORMAL HIGH (ref 0.61–1.24)
GFR calc non Af Amer: 18 mL/min — ABNORMAL LOW (ref >60)
GFR, EST AFRICAN AMERICAN: 21 mL/min — AB (ref >60)
GLUCOSE: 89 mg/dL (ref 65–99)
Potassium: 4 mmol/L (ref 3.5–5.1)
Sodium: 139 mmol/L (ref 135–145)
TOTAL PROTEIN: 7.2 g/dL (ref 6.5–8.1)

## 2016-05-22 LAB — CBC WITH DIFFERENTIAL/PLATELET
Basophils Absolute: 0 10*3/uL (ref 0.0–0.1)
Basophils Relative: 0 %
EOS PCT: 2 %
Eosinophils Absolute: 0.4 10*3/uL (ref 0.0–0.7)
HEMATOCRIT: 47.8 % (ref 39.0–52.0)
HEMOGLOBIN: 16.4 g/dL (ref 13.0–17.0)
LYMPHS ABS: 11.5 10*3/uL — AB (ref 0.7–4.0)
Lymphocytes Relative: 58 %
MCH: 32 pg (ref 26.0–34.0)
MCHC: 34.3 g/dL (ref 30.0–36.0)
MCV: 93.2 fL (ref 78.0–100.0)
MONOS PCT: 4 %
Monocytes Absolute: 0.8 10*3/uL (ref 0.1–1.0)
NEUTROS ABS: 7.2 10*3/uL (ref 1.7–7.7)
Neutrophils Relative %: 36 %
Platelets: 169 10*3/uL (ref 150–400)
RBC: 5.13 MIL/uL (ref 4.22–5.81)
RDW: 14.5 % (ref 11.5–15.5)
WBC: 19.9 10*3/uL — ABNORMAL HIGH (ref 4.0–10.5)

## 2016-05-22 LAB — LACTATE DEHYDROGENASE: LDH: 125 U/L (ref 98–192)

## 2016-05-22 NOTE — Patient Instructions (Signed)
[place] at [facility] Discharge Instructions  RECOMMENDATIONS MADE BY THE CONSULTANT AND ANY TEST RESULTS WILL BE SENT TO YOUR REFERRING PHYSICIAN.  You saw Dr. Whitney Muse today. You will have an EKG and labs today. Follow up in 3 months with labs.  Thank you for choosing [place] at [facility] to provide your oncology and hematology care.  To afford each patient quality time with our provider, please arrive at least 15 minutes before your scheduled appointment time.   Beginning January 23rd 2017 lab work for the [facility] will be done in the  Main lab at [facility] on 1st floor. If you have a lab appointment with the [place] please come in thru the  Main Entrance and check in at the main information desk  You need to re-schedule your appointment should you arrive 10 or more minutes late.  We strive to give you quality time with our providers, and arriving late affects you and other patients whose appointments are after yours.  Also, if you no show three or more times for appointments you may be dismissed from the clinic at the providers discretion.     Again, thank you for choosing [facility].  Our hope is that these requests will decrease the amount of time that you wait before being seen by our physicians.       _____________________________________________________________  Should you have questions after your visit to [facility], please contact our office at [phone] between the hours of 8:30 a.m. and 4:30 p.m.  Voicemails left after 4:30 p.m. will not be returned until the following business day.  For prescription refill requests, have your pharmacy contact our office.         Resources For Cancer Patients and their Caregivers ? American Cancer Society: Can assist with transportation, wigs, general needs, runs Look Good Feel Better.        475-115-5457 ? Cancer Care: Provides financial  assistance, online support groups, medication/co-pay assistance.  1-800-813-HOPE 351-380-9958) ? [place] Assists [place] cancer patients and their families through emotional , educational and financial support.  618-653-0367 ? [place] DSS Where to apply for food stamps, Medicaid and utility assistance. 484-213-0581 ? RCATS: Transportation to medical appointments. (325)769-7952 ? Social Security Administration: May apply for disability if have a Stage IV cancer. 431-154-0204 518-145-3475 ? [employer], Disability and Transit Services: Assists with nutrition, care and transit needs. [place] Support Programs: @10RELATIVEDAYS @ > Cancer Support Group  2nd Tuesday of the month 1pm-2pm, Journey Room  > Creative Journey  3rd Tuesday of the month 1130am-1pm, Journey Room  > Look Good Feel Better  1st Wednesday of the month 10am-12 noon, Journey Room (Call [place] to register [phone])

## 2016-05-22 NOTE — Progress Notes (Signed)
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 male is here because of Leukocytosis. On chart review leukocytosis was present dating past to at least 2009. Other counts are preserved. He is a smoker, approximately 1ppd for 45 years. He has a diagnosis of CLL.  He had kidney cancer and had a total nephrectomy last year. Kidney was nonfunctioning, mass was centrally located. 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  [name] is unaccompanied. He notes that he read some on CLL. His wife read a lot of the provided material and "was a nervous wreck.". Reviewed positive CALR mutation.   The patient is not interested in a consultation with Dr. Florene Glen at this time.  He is doing well and is asymptomatic. He has no major complaints at this time. He continues to smoke. He is planning a two week trip to [facility] in December.   He denies feeling as though his heart skips around. States he was diagnosed with a problem with a heart valve but cannot remember who diagnosed it.   No night sweats. Appetite is good.                                                                                                            MEDICAL HISTORY:  Past Medical History:    Diagnosis Date  . Anemia   . BPH (benign prostatic hyperplasia)   . Chronic kidney disease   . History of hydronephrosis   . Hyperlipidemia   . MVP (mitral valve prolapse)    Mild mitral regurgitation 2014    SURGICAL HISTORY: Past Surgical History:  Procedure Laterality Date  . CATARACT EXTRACTION W/PHACO Right 11/28/2015   Procedure: CATARACT EXTRACTION PHACO AND INTRAOCULAR LENS PLACEMENT (IOC);  Surgeon: Williams Che, MD;  Location: [facility];  Service: Ophthalmology;  Laterality: Right;  CDE: 7.14  . COLONOSCOPY N/A 02/08/2015   Procedure: COLONOSCOPY;  Surgeon: Daneil Dolin, MD;  Location: [facility];  Service: Endoscopy;  Laterality: N/A;  200  . CYSTOSCOPY WITH RETROGRADE PYELOGRAM, URETEROSCOPY AND STENT PLACEMENT Bilateral 12/03/2014   Procedure: CYSTOSCOPY WITH BILATERAL RETROGRADE PYELOGRAM, BILATERAL  URETEROSCOPY ;  Surgeon: Claybon Jabs, MD;  Location: [facility];  Service: Urology;  Laterality: Bilateral;  . INGUINAL HERNIA REPAIR Left   . LAPAROSCOPIC NEPHRECTOMY Right 02/18/2015   Procedure: LAPAROSCOPIC RIGHT RADICAL NEPHRECTOMY;  Surgeon: Ardis Hughs, MD;  Location: [facility];  Service: Urology;  Laterality: Right;  . NEPHROSTOMY  2006   DUE TO ENLARGED PROSTATE  . TONSILLECTOMY    . TRANSURETHRAL RESECTION OF PROSTATE    . VASECTOMY      SOCIAL HISTORY: Social History   Social History  . Marital status: Married    Spouse name: N/A  . Number of children: N/A  . Years of education: N/A   Occupational History  . Not on file.   Social History Main Topics  . Smoking status: Current Every Day Smoker    Packs/day: 1.00    Years: 40.00    Types: Cigarettes  . Smokeless tobacco: Never Used  . Alcohol use 0.0 oz/week     Comment: Rare  . Drug use: No  . Sexual activity: No   Other Topics Concern  . Not on file   Social History Narrative  . No narrative on file  Married for 42 years 2 dogs no kids Smokes pack a day; started at 2 years  old Doesn't drink ETOH normally Hobbies: firefighter Works as a Writer in [place]: Family History  Problem Relation Age of Onset  . Heart attack Father     Died age 66  . Colon cancer Maternal Grandfather     In his 47s  Mother died at 19 from pneumonia. Father died at 58 from heart attack. 1 sister-Healthy Grandfather on Mother side died of colon cancer.   ALLERGIES:  has No Known Allergies.  MEDICATIONS:  Current Outpatient Prescriptions  Medication Sig Dispense Refill  . atorvastatin (LIPITOR) 40 MG tablet Take 20 mg by mouth daily.     . cholecalciferol (VITAMIN D) 1000 UNITS tablet Take 1,000 Units by mouth every morning.     . Coenzyme Q10 (CO Q10 PO) Take 1 tablet by mouth at bedtime.     . Multiple Vitamin (MULTIVITAMIN WITH MINERALS) TABS tablet Take 1 tablet by mouth every morning.     . sodium bicarbonate 650 MG tablet Take 650 mg by mouth 3 (three) times daily.     . tamsulosin (FLOMAX) 0.4 MG CAPS capsule Take 1 capsule by mouth daily.     No current facility-administered medications for this visit.     Review of Systems  Constitutional: Negative.  Negative for chills, fever, malaise/fatigue and weight loss.  HENT: Negative.  Negative for congestion, hearing loss, nosebleeds, sore throat and tinnitus.   Eyes: Negative.  Negative for blurred vision, double vision, pain and discharge.  Respiratory: Negative.  Negative for cough, hemoptysis, sputum production, shortness of breath and wheezing.   Cardiovascular: Negative.  Negative for chest pain, palpitations, claudication, leg swelling and PND.  Gastrointestinal: Negative.  Negative for abdominal pain, blood in stool, constipation, diarrhea, heartburn, melena, nausea and vomiting.  Genitourinary: Negative.  Negative for dysuria, frequency, hematuria and urgency.  Musculoskeletal: Negative.  Negative for falls, joint pain and myalgias.  Skin: Negative.  Negative for itching and  rash.  Neurological: Negative.  Negative for dizziness, tingling, tremors, sensory change, speech change, focal weakness, seizures, loss of consciousness, weakness and headaches.  Endo/Heme/Allergies: Negative.  Does not bruise/bleed easily.  Psychiatric/Behavioral: Negative.  Negative for depression, memory loss, substance abuse and suicidal ideas. The patient is not nervous/anxious and does not have insomnia.   All other systems reviewed and are negative. 14 point ROS was done and is otherwise as detailed above or in HPI   PHYSICAL EXAMINATION: ECOG PERFORMANCE STATUS: 0 - Asymptomatic  Vitals:   05/22/16 0900  BP: 117/71  Pulse: 85  Resp: 16  Temp: 98.3 F (36.8 C)   Filed Weights   05/22/16 0900  Weight: 174 lb (78.9 kg)   Physical Exam  Constitutional: He is oriented to person, place, and time and well-developed, well-nourished, and in no distress.  HENT:  Head: Normocephalic and atraumatic.  Nose: Nose normal.  Mouth/Throat: Oropharynx is clear and moist. No oropharyngeal exudate.  Eyes: Conjunctivae and EOM are normal. Pupils are equal, round, and reactive to light. Right eye exhibits no discharge. Left eye exhibits no discharge. No scleral icterus.  Neck: Normal range of motion. Neck supple. No tracheal deviation present. No thyromegaly present.  Cardiovascular: Normal rate and normal heart sounds.  Exam reveals no gallop and no friction rub.   No murmur heard. Ectopy noted  Pulmonary/Chest: Effort normal and breath sounds normal. He has no wheezes. He has no rales.  Abdominal: Soft. Bowel sounds are normal. He exhibits no distension and no mass. There is no tenderness. There is no rebound and no guarding.  Musculoskeletal: Normal range of motion. He exhibits no edema.  Lymphadenopathy:    He has no cervical adenopathy.  Neurological: He is alert and oriented to person, place, and time. He has normal reflexes. No cranial nerve deficit. Gait normal. Coordination normal.    Skin: Skin is warm and dry. No rash noted.  Psychiatric: Mood, memory, affect and judgment normal.  Nursing note and vitals reviewed.   LABORATORY DATA:  I have reviewed the data as listed Lab Results  Component Value Date   WBC 19.9 (H) 05/22/2016   HGB 16.4 05/22/2016   HCT 47.8 05/22/2016   MCV 93.2 05/22/2016   PLT 169 05/22/2016   CMP     Component Value Date/Time   NA 139 05/22/2016 1041   K 4.0 05/22/2016 1041   CL 107 05/22/2016 1041   CO2 24 05/22/2016 1041   GLUCOSE 89 05/22/2016 1041   BUN 27 (H) 05/22/2016 1041   CREATININE 3.27 (H) 05/22/2016 1041   CALCIUM 9.6 05/22/2016 1041   PROT 7.2 05/22/2016 1041   ALBUMIN 4.2 05/22/2016 1041   AST 21 05/22/2016 1041   ALT 19 05/22/2016 1041   ALKPHOS 60 05/22/2016 1041   BILITOT 0.7 05/22/2016 1041   GFRNONAA 18 (L) 05/22/2016 1041   GFRAA 21 (L) 05/22/2016 1041        JAK2 V617F, Rfx CALR/E12/MPL  Order: [id number]   Status:  Edited Result - FINAL Visible to patient:  Yes (MyChart) Next appt:  None Dx:  Leukocytosis   1mo ago  JAK2 GenotypR Comment  Comments: (NOTE)  Result: NEGATIVE for the JAK2 V617F mutation.  Interpretation: The G to T nucleotide change encoding the V617F  mutation was not detected. This result does not rule out the  presence of the JAK2 mutation at a level below the sensitivity of  detection of this assay, or the presence of other mutations within  JAK2 not detected by this assay. This result does not rule out a  diagnosis of polycythemia vera, essential thrombocythemia or  idiopathic myelofibrosis as the V617F mutation is not detected in all  patients with these disorders.   BACKGROUND: Comment  Comments: (NOTE)  JAK2 is a cytoplasmic tyrosine kinase with a key role in signal  transduction from multiple hematopoietic growth factor receptors.  A point mutation within exon 14 of the JAK2 gene [id number]) encoding a  valine to phenylalanine substitution at position 617 of  the  JAK2  protein (V617F) has been identified in most patients with  polycythemia vera, and in about half of those with either essential  thrombocythemia or idiopathic myelofibrosis. The V617F has also been  detected, although infrequently, in other myeloid disorders such as  chronic myelomonocytic leukemia and chronic neutrophilic leukemia.  V617F is an acquired mutation that alters a highly conserved valine  present in the negative regulatory JH2 domain of the JAK2 protein and  is predicted to dysregulate kinase activity.  Methodology:  Genomic DNA was purified from the provided specimen. Allele-  specific PCR using fluorescent primers was used to simultaneously  amplify both the wild type and mutant alleles. Amplification products  were analyzed by capillary electrophoresis. This assay has a  sensitivity to detect approximately a 5% population of cells  containing the V617F mutation in a background of non-mutant cells.  Reference:  Tefferi A and Gilliland DG. The JAK2 V617F Tyrosine Kinase  Mutation in Myeloproliferative Disorders: Status Report and  Immediate Implications for Disease Classification and Diagnosis.  [facility] Clin Proc 2005;80(7):947-958.   Director Review, JAK2 Comment  Comments: (NOTE)  Jacklynn Bue MS, PhD, [facility]  [place], [place]  [phone]   REFLEX: Comment  Comments: (NOTE)  Reflex to CALR Mutation Analysis, JAK2 Exon 12 Mutation Analysis, and  MPL Mutation Analysis is indicated.  Performed At: [facility]  [address] [place], [place] [id number]  Nechama Guard MD [id number]  Performed At: [facility]  [address] [place], [place] [id number]  Nechama Guard MD [id number]         PATHOLOGY:   ASSESSMENT & PLAN:  CLL, Rai stage 0, BINET stage A CALR mutation CKD, stage IV R laparoscopic radical nephrectomy, papillary RCC,  Fuhrman Grade III, spanning 2.2 cm, negative resection margins (pT1a,pNX) CT - stone protocol 08/29/2015 R nephrectomy w/o evidence of metastatic disease, multiple low and high attenuation lesions in the L kidney H/O obstructive uropathy with B percutaneous nephrostomy tubes S/P TURP Tobacco Use  Questions were answered today regarding his CLL. CBC was reviewed.   He has significant CKD, stage IV but normal H/H. I am not sure if this is secondary to his tobacco use and "seconary polycythemia."  I did an MPD evaluation at his initial visit and found a CALR mutation:  CALR mutations have been observed in approximately 70 percent of patients with ET or PMF who do not carry a mutation in either JAK2 or MPL, and have only rarely been observed in patients with PV; all known CALR mutations involve frameshift mutations within exon 9 that generate a mutant protein with a novel C-terminus  The significance of JAK2, MPL, CALR, and other mutations in the genesis of the MPNs as well as their relative roles in determining disease phenotype, leukemic transformation, and the level of involvement of stem cells in these disorders are unclear at present   Note it may be of uncertain significance at this point.  We discussed this today in detail.   I did offer the patient a second opinion at [facility] with Dr. Florene Glen. He notes that currently he is not interested.   I have ordered an EKG based on physical examination findings, PVC's were noted. EKG was compared to priors.   He will return in 3 months for follow up.  Orders Placed This Encounter  Procedures  . CBC with Differential    Standing Status:  Future    Number of Occurrences:   1    Standing Expiration Date:   05/22/2017  . Comprehensive metabolic panel    Standing Status:   Future    Number of Occurrences:   1    Standing Expiration Date:   05/22/2017  . Lactate dehydrogenase    Standing Status:   Future    Number of Occurrences:   1    Standing  Expiration Date:   05/22/2017  . CBC with Differential    Standing Status:   Future    Standing Expiration Date:   05/22/2017  . Comprehensive metabolic panel    Standing Status:   Future    Standing Expiration Date:   05/22/2017  . Lactate dehydrogenase    Standing Status:   Future    Standing Expiration Date:   05/22/2017  . EKG 12-Lead    Order Specific Question:   Where should this test be performed    Answer:   [facility]    All questions were answered. The patient knows to call the clinic with any problems, questions or concerns.  This document serves as a record of services personally performed by Ancil Linsey, MD. It was created on her behalf by Arlyce Harman, a trained medical scribe. The creation of this record is based on the scribe's personal observations and the provider's statements to them. This document has been checked and approved by the attending provider.  I have reviewed the above documentation for accuracy and completeness, and I agree with the above.  This note was electronically signed.  Molli Hazard, MD  05/22/2016 5:42 PM

## 2016-06-08 NOTE — Progress Notes (Signed)
This encounter was created in error - please disregard.

## 2016-06-12 ENCOUNTER — Telehealth (HOSPITAL_COMMUNITY): Payer: Self-pay | Admitting: Emergency Medicine

## 2016-06-12 NOTE — Telephone Encounter (Signed)
Let pt know that we made referral to cardiology today

## 2016-06-28 ENCOUNTER — Encounter: Payer: Self-pay | Admitting: Cardiovascular Disease

## 2016-06-28 ENCOUNTER — Ambulatory Visit (INDEPENDENT_AMBULATORY_CARE_PROVIDER_SITE_OTHER): Payer: Medicare Other | Admitting: Cardiovascular Disease

## 2016-06-28 VITALS — BP 104/68 | HR 99 | Ht 71.0 in | Wt 171.0 lb

## 2016-06-28 DIAGNOSIS — I341 Nonrheumatic mitral (valve) prolapse: Secondary | ICD-10-CM | POA: Diagnosis not present

## 2016-06-28 DIAGNOSIS — I340 Nonrheumatic mitral (valve) insufficiency: Secondary | ICD-10-CM | POA: Diagnosis not present

## 2016-06-28 NOTE — Patient Instructions (Signed)
Your physician wants you to follow-up in: 6 months You will receive a reminder letter in the mail two months in advance. If you don't receive a letter, please call our office to schedule the follow-up appointment.    Your physician has requested that you have an echocardiogram. Echocardiography is a painless test that uses sound waves to create images of your heart. It provides your doctor with information about the size and shape of your heart and how well your heart's chambers and valves are working. This procedure takes approximately one hour. There are no restrictions for this procedure.     Thank you for choosing [facility] !

## 2016-06-28 NOTE — Progress Notes (Signed)
Cardiology Office Note   Date:  06/28/2016   ID:  Keith Rivera, Keith Rivera 1951-09-20, MRN [id number]  PCP:  Wende Neighbors, MD  Cardiologist:   Jenkins Rouge, MD   No chief complaint on file.     History of Present Illness: Keith Rivera is a 65 y.o. male who presents for evaluation of abnormal ECG and ectopy on exam During visit with oncologist  Seen by Dr Domenic Polite for preoperative clearance 01/12/15 History of MVP with  MR diagnosed in 2014  Echo 01/19/15 showed mild prolapse and mild MR  EF normal   CRF;s HTN and elevated lipids Still smoking   Had right laprascopic nephrectomy for renal cancer  2016  Elevated WBC and Dx with CLL  ECG done at her office with PVC;s    CRF with Cr over 3  He is concerned about future need of dialysis   No chest pain palpitations dyspnea or syncope   Works at [employer] and is looking forward to going to [place] with wife in December    Past Medical History:  Diagnosis Date  . Anemia   . BPH (benign prostatic hyperplasia)   . Chronic kidney disease   . History of hydronephrosis   . Hyperlipidemia   . MVP (mitral valve prolapse)    Mild mitral regurgitation 2014    Past Surgical History:  Procedure Laterality Date  . CATARACT EXTRACTION W/PHACO Right 11/28/2015   Procedure: CATARACT EXTRACTION PHACO AND INTRAOCULAR LENS PLACEMENT (IOC);  Surgeon: Williams Che, MD;  Location: [facility];  Service: Ophthalmology;  Laterality: Right;  CDE: 7.14  . COLONOSCOPY N/A 02/08/2015   Procedure: COLONOSCOPY;  Surgeon: Daneil Dolin, MD;  [facility];  Service: Endoscopy;  Laterality: N/A;  200  . CYSTOSCOPY WITH RETROGRADE PYELOGRAM, URETEROSCOPY AND STENT PLACEMENT Bilateral 12/03/2014   Procedure: CYSTOSCOPY WITH BILATERAL RETROGRADE PYELOGRAM, BILATERAL  URETEROSCOPY ;  Surgeon: Claybon Jabs, MD;  Location: [facility];  Service: Urology;  Laterality: Bilateral;  . INGUINAL HERNIA REPAIR Left   . LAPAROSCOPIC NEPHRECTOMY Right 02/18/2015   Procedure: LAPAROSCOPIC RIGHT RADICAL NEPHRECTOMY;  Surgeon: Ardis Hughs, MD;  [facility];  Service: Urology;  Laterality: Right;  . NEPHROSTOMY  2006   DUE TO ENLARGED PROSTATE  . TONSILLECTOMY    . TRANSURETHRAL RESECTION OF PROSTATE    . VASECTOMY       Current Outpatient Prescriptions  Medication Sig Dispense Refill  . atorvastatin (LIPITOR) 40 MG tablet Take 20 mg by mouth daily.     . cholecalciferol (VITAMIN D) 1000 UNITS tablet Take 1,000 Units by mouth every morning.     . Coenzyme Q10 (CO Q10 PO) Take 1 tablet by mouth at bedtime.     . Multiple Vitamin (MULTIVITAMIN WITH MINERALS) TABS tablet Take 1 tablet by mouth every morning.     . sodium bicarbonate 650 MG tablet Take 650 mg by mouth 3 (three) times daily.     . tamsulosin (FLOMAX) 0.4 MG CAPS capsule Take 1 capsule by mouth daily.     No current facility-administered medications for this visit.     Allergies:   Review of patient's allergies indicates no known allergies.    Social History:  The patient  reports that he has been smoking Cigarettes.  He started smoking about 45 years ago. He has a 40.00 pack-year smoking history. He has never used smokeless tobacco. He reports that he drinks alcohol. He reports that he does not use  drugs.   Family History:  The patient's family history includes Colon cancer in his maternal grandfather; Heart attack in his father.    ROS:  Please see the history of present illness.   Otherwise, review of systems are positive for none.   All other systems are reviewed and negative.    PHYSICAL EXAM: VS:  BP 104/68 (BP Location: Right Arm)   Pulse 99   Ht 5\' 11"  (1.803 m)   Wt 171 lb (77.6 kg)   SpO2 97%   BMI 23.85 kg/m  , BMI Body mass index is 23.85 kg/m. Affect appropriate Bronchitic male with nicotine on breath  HEENT: normal Neck supple with no adenopathy JVP normal no bruits no thyromegaly Lungs clear with no wheezing and good diaphragmatic motion Heart:   S1/S2 mid peaking MR murmur with valsalva  murmur, no rub, gallop or click PMI normal Abdomen: benighn, BS positve, no tenderness, no AAA no bruit.  No HSM or HJR Distal pulses intact with no bruits No edema Neuro non-focal Skin warm and dry No muscular weakness    EKG:   05/22/16  SR rate 80 isolated PVC     Recent Labs: 05/22/2016: ALT 19; BUN 27; Creatinine, Ser 3.27; Hemoglobin 16.4; Platelets 169; Potassium 4.0; Sodium 139    Lipid Panel No results found for: CHOL, TRIG, HDL, CHOLHDL, VLDL, LDLCALC, LDLDIRECT    Wt Readings from Last 3 Encounters:  06/28/16 171 lb (77.6 kg)  05/22/16 174 lb (78.9 kg)  03/22/16 173 lb 3.2 oz (78.6 kg)      Other studies Reviewed: Additional studies/ records that were reviewed today include: Notes Dr Louis Meckel urology Dr Whitney Muse Heme and Dr Chuck Hint old notes and Echo .    ASSESSMENT AND PLAN:  1.  MVP mild on exam f/u echo given PVC;s  2. PVC benign sounding related to smoking and MVP no need for monitor as asymptomatic 3. CLL  F/u [facility] no Rx at this time  4. Renal cell carcinoma with CRF f/u [facility] likely to need dialysis in future Urinating ok And no uremic symptoms    Current medicines are reviewed at length with the patient today.  The patient does not have concerns regarding medicines.  The following changes have been made:  no change  Labs/ tests ordered today include: Echo  No orders of the defined types were placed in this encounter.    Disposition:   FU with SM 6 months      Signed, Jenkins Rouge, MD  06/28/2016 2:07 PM    [place], [place], [place]  [address] Phone: [phone]; Fax: [phone]

## 2016-07-13 ENCOUNTER — Ambulatory Visit (HOSPITAL_COMMUNITY)
Admission: RE | Admit: 2016-07-13 | Discharge: 2016-07-13 | Disposition: A | Discharge status: Home / Self Care | Payer: Medicare Other | Source: Ambulatory Visit | Attending: Cardiovascular Disease | Admitting: Cardiovascular Disease

## 2016-07-13 DIAGNOSIS — I340 Nonrheumatic mitral (valve) insufficiency: Secondary | ICD-10-CM | POA: Insufficient documentation

## 2016-07-13 DIAGNOSIS — I341 Nonrheumatic mitral (valve) prolapse: Secondary | ICD-10-CM | POA: Insufficient documentation

## 2016-07-13 DIAGNOSIS — K769 Liver disease, unspecified: Secondary | ICD-10-CM | POA: Insufficient documentation

## 2016-07-13 NOTE — Progress Notes (Signed)
  Echocardiogram 2D Echocardiogram has been performed.  Darlina Sicilian M 07/13/2016, 9:00 AM

## 2016-08-07 DIAGNOSIS — N184 Chronic kidney disease, stage 4 (severe): Secondary | ICD-10-CM | POA: Diagnosis not present

## 2016-08-07 DIAGNOSIS — I10 Essential (primary) hypertension: Secondary | ICD-10-CM | POA: Diagnosis not present

## 2016-08-07 DIAGNOSIS — R809 Proteinuria, unspecified: Secondary | ICD-10-CM | POA: Diagnosis not present

## 2016-08-07 DIAGNOSIS — E872 Acidosis: Secondary | ICD-10-CM | POA: Diagnosis not present

## 2016-08-15 DIAGNOSIS — D485 Neoplasm of uncertain behavior of skin: Secondary | ICD-10-CM | POA: Diagnosis not present

## 2016-08-15 DIAGNOSIS — L821 Other seborrheic keratosis: Secondary | ICD-10-CM | POA: Diagnosis not present

## 2016-08-15 DIAGNOSIS — L988 Other specified disorders of the skin and subcutaneous tissue: Secondary | ICD-10-CM | POA: Diagnosis not present

## 2016-08-15 DIAGNOSIS — D225 Melanocytic nevi of trunk: Secondary | ICD-10-CM | POA: Diagnosis not present

## 2016-08-22 ENCOUNTER — Encounter (HOSPITAL_COMMUNITY): Payer: Medicare Other | Attending: Hematology & Oncology | Admitting: Hematology & Oncology

## 2016-08-22 ENCOUNTER — Encounter (HOSPITAL_COMMUNITY): Payer: Self-pay | Admitting: Hematology & Oncology

## 2016-08-22 ENCOUNTER — Encounter (HOSPITAL_COMMUNITY): Payer: Medicare Other

## 2016-08-22 VITALS — BP 138/85 | HR 73 | Temp 98.0°F | Resp 16 | Wt 171.2 lb

## 2016-08-22 DIAGNOSIS — N189 Chronic kidney disease, unspecified: Secondary | ICD-10-CM

## 2016-08-22 DIAGNOSIS — Z79899 Other long term (current) drug therapy: Secondary | ICD-10-CM | POA: Insufficient documentation

## 2016-08-22 DIAGNOSIS — C9110 Chronic lymphocytic leukemia of B-cell type not having achieved remission: Secondary | ICD-10-CM | POA: Diagnosis not present

## 2016-08-22 DIAGNOSIS — D72829 Elevated white blood cell count, unspecified: Secondary | ICD-10-CM | POA: Diagnosis not present

## 2016-08-22 DIAGNOSIS — F17210 Nicotine dependence, cigarettes, uncomplicated: Secondary | ICD-10-CM | POA: Diagnosis not present

## 2016-08-22 DIAGNOSIS — Z905 Acquired absence of kidney: Secondary | ICD-10-CM | POA: Insufficient documentation

## 2016-08-22 DIAGNOSIS — N184 Chronic kidney disease, stage 4 (severe): Secondary | ICD-10-CM | POA: Diagnosis not present

## 2016-08-22 DIAGNOSIS — Z720 Tobacco use: Secondary | ICD-10-CM | POA: Diagnosis not present

## 2016-08-22 DIAGNOSIS — Z85528 Personal history of other malignant neoplasm of kidney: Secondary | ICD-10-CM

## 2016-08-22 LAB — CBC WITH DIFFERENTIAL/PLATELET
BAND NEUTROPHILS: 0 %
BLASTS: 0 %
Basophils Absolute: 0.2 10*3/uL — ABNORMAL HIGH (ref 0.0–0.1)
Basophils Relative: 1 %
EOS PCT: 3 %
Eosinophils Absolute: 0.7 10*3/uL (ref 0.0–0.7)
HEMATOCRIT: 49.4 % (ref 39.0–52.0)
HEMOGLOBIN: 16.8 g/dL (ref 13.0–17.0)
Lymphocytes Relative: 59 %
Lymphs Abs: 14.3 10*3/uL — ABNORMAL HIGH (ref 0.7–4.0)
MCH: 31.8 pg (ref 26.0–34.0)
MCHC: 34 g/dL (ref 30.0–36.0)
MCV: 93.4 fL (ref 78.0–100.0)
METAMYELOCYTES PCT: 0 %
MYELOCYTES: 0 %
Monocytes Absolute: 0 10*3/uL — ABNORMAL LOW (ref 0.1–1.0)
Monocytes Relative: 0 %
NEUTROS PCT: 37 %
Neutro Abs: 9 10*3/uL — ABNORMAL HIGH (ref 1.7–7.7)
PROMYELOCYTES ABS: 0 %
Platelets: 178 10*3/uL (ref 150–400)
RBC: 5.29 MIL/uL (ref 4.22–5.81)
RDW: 14.6 % (ref 11.5–15.5)
WBC: 24.2 10*3/uL — AB (ref 4.0–10.5)
nRBC: 0 /100 WBC

## 2016-08-22 LAB — COMPREHENSIVE METABOLIC PANEL
ALBUMIN: 4.4 g/dL (ref 3.5–5.0)
ALK PHOS: 62 U/L (ref 38–126)
ALT: 20 U/L (ref 17–63)
ANION GAP: 7 (ref 5–15)
AST: 25 U/L (ref 15–41)
BILIRUBIN TOTAL: 0.8 mg/dL (ref 0.3–1.2)
BUN: 30 mg/dL — ABNORMAL HIGH (ref 6–20)
CALCIUM: 9.8 mg/dL (ref 8.9–10.3)
CO2: 26 mmol/L (ref 22–32)
Chloride: 105 mmol/L (ref 101–111)
Creatinine, Ser: 3.23 mg/dL — ABNORMAL HIGH (ref 0.61–1.24)
GFR, EST AFRICAN AMERICAN: 21 mL/min — AB (ref >60)
GFR, EST NON AFRICAN AMERICAN: 19 mL/min — AB (ref >60)
GLUCOSE: 94 mg/dL (ref 65–99)
POTASSIUM: 4.1 mmol/L (ref 3.5–5.1)
Sodium: 138 mmol/L (ref 135–145)
TOTAL PROTEIN: 7.5 g/dL (ref 6.5–8.1)

## 2016-08-22 LAB — LACTATE DEHYDROGENASE: LDH: 133 U/L (ref 98–192)

## 2016-08-22 NOTE — Progress Notes (Signed)
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 male is here because of Leukocytosis. On chart review leukocytosis was present dating past to at least 2009. Other counts are preserved. He is a smoker, approximately 1ppd for 45 years. He has a diagnosis of CLL.  He had kidney cancer and had a total nephrectomy last year. Kidney was nonfunctioning, mass was centrally located. 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  Keith Rivera returns to the [facility] today unaccompanied. I personally reviewed and went over laboratory studies with the patient.  He continues to smoke. He has been cutting back some days.   He has not been sick recently. "Nothing out of the ordinary has been going on". He has been eating well. He denies any new lumps or bumps. He denies any drenching night sweats or chest pain.   He continues to work, noting it has been hectic lately.   He has received a flu shot this year.                                                                                                           MEDICAL HISTORY:  Past Medical History:  Diagnosis Date  . Anemia   . BPH (benign prostatic hyperplasia)   . Chronic kidney disease   . History of  hydronephrosis   . Hyperlipidemia   . MVP (mitral valve prolapse)    Mild mitral regurgitation 2014    SURGICAL HISTORY: Past Surgical History:  Procedure Laterality Date  . CATARACT EXTRACTION W/PHACO Right 11/28/2015   Procedure: CATARACT EXTRACTION PHACO AND INTRAOCULAR LENS PLACEMENT (IOC);  Surgeon: Williams Che, MD;  Location: [facility];  Service: Ophthalmology;  Laterality: Right;  CDE: 7.14  . COLONOSCOPY N/A 02/08/2015   Procedure: COLONOSCOPY;  Surgeon: Daneil Dolin, MD;  Location: [facility] SUITE;  Service: Endoscopy;  Laterality: N/A;  200  . CYSTOSCOPY WITH RETROGRADE PYELOGRAM, URETEROSCOPY AND STENT PLACEMENT Bilateral 12/03/2014   Procedure: CYSTOSCOPY WITH BILATERAL RETROGRADE PYELOGRAM, BILATERAL  URETEROSCOPY ;  Surgeon: Claybon Jabs, MD;  Location: [facility];  Service: Urology;  Laterality: Bilateral;  . INGUINAL HERNIA REPAIR Left   . LAPAROSCOPIC NEPHRECTOMY Right 02/18/2015   Procedure: LAPAROSCOPIC RIGHT RADICAL NEPHRECTOMY;  Surgeon: Ardis Hughs, MD;  Location: [facility];  Service: Urology;  Laterality: Right;  . NEPHROSTOMY  2006   DUE TO ENLARGED PROSTATE  .  TONSILLECTOMY    . TRANSURETHRAL RESECTION OF PROSTATE    . VASECTOMY      SOCIAL HISTORY: Social History   Social History  . Marital status: Married    Spouse name: N/A  . Number of children: N/A  . Years of education: N/A   Occupational History  . Not on file.   Social History Main Topics  . Smoking status: Current Every Day Smoker    Packs/day: 1.00    Years: 40.00    Types: Cigarettes    Start date: 06/29/1971  . Smokeless tobacco: Never Used  . Alcohol use 0.0 oz/week     Comment: Rare  . Drug use: No  . Sexual activity: No   Other Topics Concern  . Not on file   Social History Narrative  . No narrative on file  Married for 42 years 2 dogs no kids Smokes pack a day; started at 9 years old Doesn't drink ETOH normally Hobbies: firefighter Works as a Writer  in [place]: Family History  Problem Relation Age of Onset  . Heart attack Father     Died age 15  . Colon cancer Maternal Grandfather     In his 7s  Mother died at 44 from pneumonia. Father died at 33 from heart attack. 1 sister-Healthy Grandfather on Mother side died of colon cancer.   ALLERGIES:  has No Known Allergies.  MEDICATIONS:  Current Outpatient Prescriptions  Medication Sig Dispense Refill  . atorvastatin (LIPITOR) 40 MG tablet Take 20 mg by mouth daily.     . cholecalciferol (VITAMIN D) 1000 UNITS tablet Take 1,000 Units by mouth every morning.     . Coenzyme Q10 (CO Q10 PO) Take 1 tablet by mouth at bedtime.     Marland Kitchen doxycycline (PERIOSTAT) 20 MG tablet     . Multiple Vitamin (MULTIVITAMIN WITH MINERALS) TABS tablet Take 1 tablet by mouth every morning.     . sodium bicarbonate 650 MG tablet Take 650 mg by mouth 3 (three) times daily.     . tamsulosin (FLOMAX) 0.4 MG CAPS capsule Take 1 capsule by mouth daily.     No current facility-administered medications for this visit.     Review of Systems  Constitutional: Negative.  Negative for chills, diaphoresis, fever, malaise/fatigue and weight loss.  HENT: Negative.  Negative for congestion, hearing loss, nosebleeds, sore throat and tinnitus.   Eyes: Negative.  Negative for blurred vision, double vision, pain and discharge.  Respiratory: Negative.  Negative for cough, hemoptysis, sputum production, shortness of breath and wheezing.   Cardiovascular: Negative.  Negative for chest pain, palpitations, claudication, leg swelling and PND.  Gastrointestinal: Negative.  Negative for abdominal pain, blood in stool, constipation, diarrhea, heartburn, melena, nausea and vomiting.  Genitourinary: Negative.  Negative for dysuria, frequency, hematuria and urgency.  Musculoskeletal: Negative.  Negative for falls, joint pain and myalgias.  Skin: Negative.  Negative for itching and rash.  Neurological: Negative.   Negative for dizziness, tingling, tremors, sensory change, speech change, focal weakness, seizures, loss of consciousness, weakness and headaches.  Endo/Heme/Allergies: Negative.  Does not bruise/bleed easily.  Psychiatric/Behavioral: Negative.  Negative for depression, memory loss, substance abuse and suicidal ideas. The patient is not nervous/anxious and does not have insomnia.   All other systems reviewed and are negative. 14 point ROS was done and is otherwise as detailed above or in HPI   PHYSICAL EXAMINATION: ECOG PERFORMANCE STATUS: 0 - Asymptomatic  Vitals:   08/22/16  1029  BP: 138/85  Pulse: 73  Resp: 16  Temp: 98 F (36.7 C)   Filed Weights   08/22/16 1029  Weight: 171 lb 3.2 oz (77.7 kg)   Physical Exam  Constitutional: He is oriented to person, place, and time and well-developed, well-nourished, and in no distress.  HENT:  Head: Normocephalic and atraumatic.  Nose: Nose normal.  Mouth/Throat: Oropharynx is clear and moist. No oropharyngeal exudate.  Eyes: Conjunctivae and EOM are normal. Pupils are equal, round, and reactive to light. Right eye exhibits no discharge. Left eye exhibits no discharge. No scleral icterus.  Neck: Normal range of motion. Neck supple. No tracheal deviation present. No thyromegaly present.  Cardiovascular: Normal rate and normal heart sounds.  Exam reveals no gallop and no friction rub.   No murmur heard. Pulmonary/Chest: Effort normal and breath sounds normal. He has no wheezes. He has no rales.  Abdominal: Soft. Bowel sounds are normal. He exhibits no distension and no mass. There is no tenderness. There is no rebound and no guarding.  Musculoskeletal: Normal range of motion. He exhibits no edema.  Lymphadenopathy:    He has no cervical adenopathy.  Neurological: He is alert and oriented to person, place, and time. He has normal reflexes. No cranial nerve deficit. Gait normal. Coordination normal.  Skin: Skin is warm and dry. No rash  noted.  Psychiatric: Mood, memory, affect and judgment normal.  Nursing note and vitals reviewed.   LABORATORY DATA:  I have reviewed the data as listed Lab Results  Component Value Date   WBC 24.2 (H) 08/22/2016   HGB 16.8 08/22/2016   HCT 49.4 08/22/2016   MCV 93.4 08/22/2016   PLT 178 08/22/2016   CMP     Component Value Date/Time   NA 138 08/22/2016 0954   K 4.1 08/22/2016 0954   CL 105 08/22/2016 0954   CO2 26 08/22/2016 0954   GLUCOSE 94 08/22/2016 0954   BUN 30 (H) 08/22/2016 0954   CREATININE 3.23 (H) 08/22/2016 0954   CALCIUM 9.8 08/22/2016 0954   PROT 7.5 08/22/2016 0954   ALBUMIN 4.4 08/22/2016 0954   AST 25 08/22/2016 0954   ALT 20 08/22/2016 0954   ALKPHOS 62 08/22/2016 0954   BILITOT 0.8 08/22/2016 0954   GFRNONAA 19 (L) 08/22/2016 0954   GFRAA 21 (L) 08/22/2016 0954        JAK2 V617F, Rfx CALR/E12/MPL  Order: [id number]   Status:  Edited Result - FINAL Visible to patient:  Yes (MyChart) Next appt:  None Dx:  Leukocytosis   41mo ago  JAK2 GenotypR Comment  Comments: (NOTE)  Result: NEGATIVE for the JAK2 V617F mutation.  Interpretation: The G to T nucleotide change encoding the V617F  mutation was not detected. This result does not rule out the  presence of the JAK2 mutation at a level below the sensitivity of  detection of this assay, or the presence of other mutations within  JAK2 not detected by this assay. This result does not rule out a  diagnosis of polycythemia vera, essential thrombocythemia or  idiopathic myelofibrosis as the V617F mutation is not detected in all  patients with these disorders.   BACKGROUND: Comment  Comments: (NOTE)  JAK2 is a cytoplasmic tyrosine kinase with a key role in signal  transduction from multiple hematopoietic growth factor receptors.  A point mutation within exon 14 of the JAK2 gene ([id number]) encoding a  valine to phenylalanine substitution at position 617 of the JAK2  protein (V617F) has  been  identified in most patients with  polycythemia vera, and in about half of those with either essential  thrombocythemia or idiopathic myelofibrosis. The V617F has also been  detected, although infrequently, in other myeloid disorders such as  chronic myelomonocytic leukemia and chronic neutrophilic leukemia.  V617F is an acquired mutation that alters a highly conserved valine  present in the negative regulatory JH2 domain of the JAK2 protein and  is predicted to dysregulate kinase activity.  Methodology:  Genomic DNA was purified from the provided specimen. Allele-  specific PCR using fluorescent primers was used to simultaneously  amplify both the wild type and mutant alleles. Amplification products  were analyzed by capillary electrophoresis. This assay has a  sensitivity to detect approximately a 5% population of cells  containing the V617F mutation in a background of non-mutant cells.  Reference:  Tefferi A and Gilliland DG. The JAK2 V617F Tyrosine Kinase  Mutation in Myeloproliferative Disorders: Status Report and  Immediate Implications for Disease Classification and Diagnosis.  [facility] Clin Proc 2005;80(7):947-958.   Director Review, JAK2 Comment  Comments: (NOTE)  Jacklynn Bue MS, PhD, [facility]  [place], [place]  [phone]   REFLEX: Comment  Comments: (NOTE)  Reflex to CALR Mutation Analysis, JAK2 Exon 12 Mutation Analysis, and  MPL Mutation Analysis is indicated.  Performed At: [facility]  [address] [place], [place] [id number]  Nechama Guard MD YP:[id number]  Performed At: [facility]  [address] [place], [place] [id number]  Nechama Guard MD AS:[id number]         PATHOLOGY:   ASSESSMENT & PLAN:  CLL, Rai stage 0, BINET stage A CALR mutation CKD, stage IV R laparoscopic radical nephrectomy, papillary RCC, Fuhrman Grade III, spanning 2.2  cm, negative resection margins (pT1a,pNX) CT - stone protocol 08/29/2015 R nephrectomy w/o evidence of metastatic disease, multiple low and high attenuation lesions in the L kidney H/O obstructive uropathy with B percutaneous nephrostomy tubes S/P TURP Tobacco Use  CBC was reviewed. Results are noted above.   He has significant CKD, stage IV but normal H/H. I am not sure if this is secondary to his tobacco use and "seconary polycythemia."  I did an MPD evaluation at his initial visit and found a CALR mutation:  CALR mutations have been observed in approximately 70 percent of patients with ET or PMF who do not carry a mutation in either JAK2 or MPL, and have only rarely been observed in patients with PV; all known CALR mutations involve frameshift mutations within exon 9 that generate a mutant protein with a novel C-terminus  The significance of JAK2, MPL, CALR, and other mutations in the genesis of the MPNs as well as their relative roles in determining disease phenotype, leukemic transformation, and the level of involvement of stem cells in these disorders are unclear at present   Note it may be of uncertain significance at this point.   I did offer the patient a second opinion at [facility] with Dr. Florene Glen. He notes that currently he is not interested.   I encouraged him to quit smoking. Smoking cessation was discussed in detail.   He will contact our clinic if he notices any new adenopathy, if his appetite changes, night sweats or change in energy.   He will return for follow up in 3 months with repeat labs.   Orders Placed This Encounter  Procedures  . CBC with Differential  Standing Status:   Future    Standing Expiration Date:   08/22/2017  . Comprehensive metabolic panel    Standing Status:   Future    Standing Expiration Date:   08/22/2017  . Lactate dehydrogenase    Standing Status:   Future    Standing Expiration Date:   08/22/2017    All questions were answered. The  patient knows to call the clinic with any problems, questions or concerns.  This document serves as a record of services personally performed by Ancil Linsey, MD. It was created on her behalf by Arlyce Harman, a trained medical scribe. The creation of this record is based on the scribe's personal observations and the provider's statements to them. This document has been checked and approved by the attending provider.  I have reviewed the above documentation for accuracy and completeness, and I agree with the above.  This note was electronically signed.  Molli Hazard, MD  08/22/2016 10:49 AM

## 2016-08-22 NOTE — Patient Instructions (Addendum)
[facility] at [facility] Discharge Instructions  RECOMMENDATIONS MADE BY THE CONSULTANT AND ANY TEST RESULTS WILL BE SENT TO YOUR REFERRING PHYSICIAN.  You saw Dr. Penland today. Follow up in 3 months with lab work.  Thank you for choosing [facility] at [facility] to provide your oncology and hematology care.  To afford each patient quality time with our provider, please arrive at least 15 minutes before your scheduled appointment time.   Beginning January 23rd 2017 lab work for the Cancer Center will be done in the  Main lab at [facility] on 1st floor. If you have a lab appointment with the Cancer Center please come in thru the  Main Entrance and check in at the main information desk  You need to re-schedule your appointment should you arrive 10 or more minutes late.  We strive to give you quality time with our providers, and arriving late affects you and other patients whose appointments are after yours.  Also, if you no show three or more times for appointments you may be dismissed from the clinic at the providers discretion.     Again, thank you for choosing [facility].  Our hope is that these requests will decrease the amount of time that you wait before being seen by our physicians.       _____________________________________________________________  Should you have questions after your visit to [facility], please contact our office at [phone] between the hours of 8:30 a.m. and 4:30 p.m.  Voicemails left after 4:30 p.m. will not be returned until the following business day.  For prescription refill requests, have your pharmacy contact our office.         Resources For Cancer Patients and their Caregivers ? American Cancer Society: Can assist with transportation, wigs, general needs, runs Look Good Feel Better.        1-888-227-6333 ? Cancer Care: Provides financial assistance, online support  groups, medication/co-pay assistance.  1-800-813-HOPE (4673) ? [facility] Assists [place] cancer patients and their families through emotional , educational and financial support.  336-427-4357 ? [place] DSS Where to apply for food stamps, Medicaid and utility assistance. 336-342-1394 ? [facility]: Transportation to medical appointments. 336-347-2287 ? Social Security Administration: May apply for disability if have a Stage IV cancer. 336-342-7796 1-800-772-1213 ? [place] Aging, Disability and Transit Services: Assists with nutrition, care and transit needs. 336-349-2343  Cancer Center Support Programs: @10RELATIVEDAYS@ > Cancer Support Group  2nd Tuesday of the month 1pm-2pm, Journey Room  > Creative Journey  3rd Tuesday of the month 1130am-1pm, Journey Room  > Look Good Feel Better  1st Wednesday of the month 10am-12 noon, Journey Room (Call American Cancer Society to register 1-800-395-5775)    

## 2016-08-27 ENCOUNTER — Ambulatory Visit (HOSPITAL_COMMUNITY)
Admission: RE | Admit: 2016-08-27 | Discharge: 2016-08-27 | Disposition: A | Discharge status: Home / Self Care | Payer: Medicare Other | Source: Ambulatory Visit | Attending: Urology | Admitting: Urology

## 2016-08-27 ENCOUNTER — Other Ambulatory Visit: Payer: Self-pay | Admitting: Urology

## 2016-08-27 DIAGNOSIS — C641 Malignant neoplasm of right kidney, except renal pelvis: Secondary | ICD-10-CM | POA: Insufficient documentation

## 2016-09-03 DIAGNOSIS — C641 Malignant neoplasm of right kidney, except renal pelvis: Secondary | ICD-10-CM | POA: Diagnosis not present

## 2016-10-24 DIAGNOSIS — R972 Elevated prostate specific antigen [PSA]: Secondary | ICD-10-CM | POA: Diagnosis not present

## 2016-11-15 DIAGNOSIS — N184 Chronic kidney disease, stage 4 (severe): Secondary | ICD-10-CM | POA: Diagnosis not present

## 2016-11-15 DIAGNOSIS — R809 Proteinuria, unspecified: Secondary | ICD-10-CM | POA: Diagnosis not present

## 2016-11-15 DIAGNOSIS — D649 Anemia, unspecified: Secondary | ICD-10-CM | POA: Diagnosis not present

## 2016-11-15 DIAGNOSIS — E559 Vitamin D deficiency, unspecified: Secondary | ICD-10-CM | POA: Diagnosis not present

## 2016-11-20 DIAGNOSIS — N184 Chronic kidney disease, stage 4 (severe): Secondary | ICD-10-CM | POA: Diagnosis not present

## 2016-11-20 DIAGNOSIS — N250 Renal osteodystrophy: Secondary | ICD-10-CM | POA: Diagnosis not present

## 2016-11-20 DIAGNOSIS — I10 Essential (primary) hypertension: Secondary | ICD-10-CM | POA: Diagnosis not present

## 2016-11-20 DIAGNOSIS — R809 Proteinuria, unspecified: Secondary | ICD-10-CM | POA: Diagnosis not present

## 2016-11-20 DIAGNOSIS — E872 Acidosis: Secondary | ICD-10-CM | POA: Diagnosis not present

## 2016-11-22 ENCOUNTER — Encounter (HOSPITAL_COMMUNITY): Payer: Self-pay | Admitting: Hematology & Oncology

## 2016-11-22 ENCOUNTER — Encounter (HOSPITAL_COMMUNITY): Payer: Medicare Other

## 2016-11-22 ENCOUNTER — Encounter (HOSPITAL_COMMUNITY): Payer: Medicare Other | Attending: Hematology & Oncology | Admitting: Hematology & Oncology

## 2016-11-22 VITALS — BP 117/83 | HR 78 | Temp 97.6°F | Resp 18 | Wt 170.3 lb

## 2016-11-22 DIAGNOSIS — Z720 Tobacco use: Secondary | ICD-10-CM | POA: Diagnosis not present

## 2016-11-22 DIAGNOSIS — C9110 Chronic lymphocytic leukemia of B-cell type not having achieved remission: Secondary | ICD-10-CM | POA: Insufficient documentation

## 2016-11-22 DIAGNOSIS — Z85528 Personal history of other malignant neoplasm of kidney: Secondary | ICD-10-CM | POA: Diagnosis not present

## 2016-11-22 DIAGNOSIS — N184 Chronic kidney disease, stage 4 (severe): Secondary | ICD-10-CM | POA: Diagnosis not present

## 2016-11-22 LAB — COMPREHENSIVE METABOLIC PANEL
ALBUMIN: 4.4 g/dL (ref 3.5–5.0)
ALT: 21 U/L (ref 17–63)
AST: 23 U/L (ref 15–41)
Alkaline Phosphatase: 65 U/L (ref 38–126)
Anion gap: 9 (ref 5–15)
BUN: 29 mg/dL — AB (ref 6–20)
CHLORIDE: 103 mmol/L (ref 101–111)
CO2: 25 mmol/L (ref 22–32)
CREATININE: 3.24 mg/dL — AB (ref 0.61–1.24)
Calcium: 9.7 mg/dL (ref 8.9–10.3)
GFR calc Af Amer: 21 mL/min — ABNORMAL LOW (ref >60)
GFR calc non Af Amer: 18 mL/min — ABNORMAL LOW (ref >60)
GLUCOSE: 95 mg/dL (ref 65–99)
POTASSIUM: 4.1 mmol/L (ref 3.5–5.1)
Sodium: 137 mmol/L (ref 135–145)
Total Bilirubin: 0.9 mg/dL (ref 0.3–1.2)
Total Protein: 7.3 g/dL (ref 6.5–8.1)

## 2016-11-22 LAB — CBC WITH DIFFERENTIAL/PLATELET
BASOS ABS: 0 10*3/uL (ref 0.0–0.1)
Basophils Relative: 0 %
EOS PCT: 2 %
Eosinophils Absolute: 0.4 10*3/uL (ref 0.0–0.7)
HEMATOCRIT: 50.6 % (ref 39.0–52.0)
Hemoglobin: 17.1 g/dL — ABNORMAL HIGH (ref 13.0–17.0)
LYMPHS ABS: 14.1 10*3/uL — AB (ref 0.7–4.0)
Lymphocytes Relative: 71 %
MCH: 31.6 pg (ref 26.0–34.0)
MCHC: 33.8 g/dL (ref 30.0–36.0)
MCV: 93.5 fL (ref 78.0–100.0)
MONO ABS: 1 10*3/uL (ref 0.1–1.0)
MONOS PCT: 5 %
NEUTROS PCT: 22 %
Neutro Abs: 4.4 10*3/uL (ref 1.7–7.7)
PLATELETS: 167 10*3/uL (ref 150–400)
RBC: 5.41 MIL/uL (ref 4.22–5.81)
RDW: 14.6 % (ref 11.5–15.5)
WBC: 19.9 10*3/uL — ABNORMAL HIGH (ref 4.0–10.5)

## 2016-11-22 LAB — LACTATE DEHYDROGENASE: LDH: 119 U/L (ref 98–192)

## 2016-11-22 NOTE — Progress Notes (Signed)
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 male is here because of CLL, he is also noted to have a CALR mutation. On chart review leukocytosis was present dating past to at least 2009. Other counts are preserved. He is a smoker, approximately 1ppd for 45 years. 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   He had kidney cancer and had a total nephrectomy last year. Kidney was nonfunctioning, mass was centrally located. 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  Mr. Lemoine returns to the [facility] today unaccompanied. I personally reviewed and went over laboratory studies with the patient.  He states he is still smoking cigarettes.  He states his kidney labs are stable. HE continues to follow with nephrology. He continues to work full time   Appetite is good.  Denies drenching night sweats.  Energy is about the same.  He had a good holiday. He states he went to [place].   He denies any other pains and doesn't have any other concerns at this time.                                                                                                           MEDICAL HISTORY:  Past Medical History:  Diagnosis Date  . Anemia   . BPH (benign prostatic  hyperplasia)   . Chronic kidney disease   . History of hydronephrosis   . Hyperlipidemia   . MVP (mitral valve prolapse)    Mild mitral regurgitation 2014    SURGICAL HISTORY: Past Surgical History:  Procedure Laterality Date  . CATARACT EXTRACTION W/PHACO Right 11/28/2015   Procedure: CATARACT EXTRACTION PHACO AND INTRAOCULAR LENS PLACEMENT (IOC);  Surgeon: Williams Che, MD;  Location: [facility];  Service: Ophthalmology;  Laterality: Right;  CDE: 7.14  . COLONOSCOPY N/A 02/08/2015   Procedure: COLONOSCOPY;  Surgeon: Daneil Dolin, MD;  Location: [facility] SUITE;  Service: Endoscopy;  Laterality: N/A;  200  . CYSTOSCOPY WITH RETROGRADE PYELOGRAM, URETEROSCOPY AND STENT PLACEMENT Bilateral 12/03/2014   Procedure: CYSTOSCOPY WITH BILATERAL RETROGRADE PYELOGRAM, BILATERAL  URETEROSCOPY ;  Surgeon: Claybon Jabs, MD;  Location: [facility];  Service: Urology;  Laterality: Bilateral;  . INGUINAL HERNIA REPAIR Left   . LAPAROSCOPIC NEPHRECTOMY Right 02/18/2015   Procedure: LAPAROSCOPIC RIGHT RADICAL NEPHRECTOMY;  Surgeon: Ardis Hughs, MD;  Location: [facility];  Service: Urology;  Laterality: Right;  . NEPHROSTOMY  2006   DUE TO ENLARGED PROSTATE  . TONSILLECTOMY    . TRANSURETHRAL RESECTION OF PROSTATE    . VASECTOMY      SOCIAL HISTORY: Social History   Social History  . Marital status: Married    Spouse name: N/A  . Number of children: N/A  . Years of education: N/A   Occupational History  . Not on file.   Social History Main Topics  . Smoking status: Current Every Day Smoker    Packs/day: 1.00    Years: 40.00    Types: Cigarettes    Start date: 06/29/1971  . Smokeless tobacco: Never Used  . Alcohol use 0.0 oz/week     Comment: Rare  . Drug use: No  . Sexual activity: No   Other Topics Concern  . Not on file   Social History Narrative  . No narrative on file  Married for 42 years 2 dogs no kids Smokes pack a day; started at 89 years old Doesn't drink ETOH  normally Hobbies: firefighter Works as a Writer in [place]: Family History  Problem Relation Age of Onset  . Heart attack Father     Died age 53  . Colon cancer Maternal Grandfather     In his 49s  Mother died at 4 from pneumonia. Father died at 6 from heart attack. 1 sister-Healthy Grandfather on Mother side died of colon cancer.   ALLERGIES:  has No Known Allergies.  MEDICATIONS:  Current Outpatient Prescriptions  Medication Sig Dispense Refill  . atorvastatin (LIPITOR) 40 MG tablet Take 20 mg by mouth daily.     . cholecalciferol (VITAMIN D) 1000 UNITS tablet Take 1,000 Units by mouth every morning.     . Coenzyme Q10 (CO Q10 PO) Take 1 tablet by mouth at bedtime.     Marland Kitchen doxycycline (PERIOSTAT) 20 MG tablet     . Multiple Vitamin (MULTIVITAMIN WITH MINERALS) TABS tablet Take 1 tablet by mouth every morning.     . sodium bicarbonate 650 MG tablet Take 650 mg by mouth 3 (three) times daily.     . tamsulosin (FLOMAX) 0.4 MG CAPS capsule Take 1 capsule by mouth daily.     No current facility-administered medications for this visit.     Review of Systems  Constitutional: Negative.  Negative for chills, diaphoresis, fever, malaise/fatigue and weight loss.       Appetite is good  HENT: Negative.  Negative for congestion, hearing loss, nosebleeds, sore throat and tinnitus.   Eyes: Negative.  Negative for blurred vision, double vision, pain and discharge.  Respiratory: Negative.  Negative for cough, hemoptysis, sputum production, shortness of breath and wheezing.   Cardiovascular: Negative.  Negative for chest pain, palpitations, claudication, leg swelling and PND.  Gastrointestinal: Negative.  Negative for abdominal pain, blood in stool, constipation, diarrhea, heartburn, melena, nausea and vomiting.  Genitourinary: Negative.  Negative for dysuria, frequency, hematuria and urgency.  Musculoskeletal: Negative.  Negative for falls, joint pain  and myalgias.  Skin: Negative.  Negative for itching and rash.  Neurological: Negative.  Negative for dizziness, tingling, tremors, sensory change, speech change, focal weakness, seizures, loss of consciousness, weakness and headaches.       Denies drenching night sweats  Endo/Heme/Allergies: Negative.  Does not bruise/bleed easily.  Psychiatric/Behavioral: Negative.  Negative for depression, memory loss, substance abuse and suicidal ideas. The patient is not nervous/anxious and does not have insomnia.   All other systems reviewed and are negative. [place]  point ROS was done and is otherwise as detailed above or in HPI   PHYSICAL EXAMINATION: ECOG PERFORMANCE STATUS: 0 - Asymptomatic  Vitals:   11/22/16 1119  BP: 117/83  Pulse: 78  Resp: 18  Temp: 97.6 F (36.4 C)   Filed Weights   11/22/16 1119  Weight: 170 lb 4.8 oz (77.2 kg)      Physical Exam  Constitutional: He is oriented to person, place, and time and well-developed, well-nourished, and in no distress.  HENT:  Head: Normocephalic and atraumatic.  Nose: Nose normal.  Mouth/Throat: Oropharynx is clear and moist. No oropharyngeal exudate.  Eyes: Conjunctivae and EOM are normal. Pupils are equal, round, and reactive to light. Right eye exhibits no discharge. Left eye exhibits no discharge. No scleral icterus.  Neck: Normal range of motion. Neck supple. No tracheal deviation present. No thyromegaly present.  Cardiovascular: Normal rate and normal heart sounds.  Exam reveals no gallop and no friction rub.   No murmur heard. Pulmonary/Chest: Effort normal and breath sounds normal. He has no wheezes. He has no rales.  Abdominal: Soft. Bowel sounds are normal. He exhibits no distension and no mass. There is no tenderness. There is no rebound and no guarding.  Musculoskeletal: Normal range of motion. He exhibits no edema.  Lymphadenopathy:    He has no cervical adenopathy.  Neurological: He is alert and oriented to person, place,  and time. He has normal reflexes. No cranial nerve deficit. Gait normal. Coordination normal.  Skin: Skin is warm and dry. No rash noted.  Psychiatric: Mood, memory, affect and judgment normal.  Nursing note and vitals reviewed.   LABORATORY DATA:  I have reviewed the data as listed Lab Results  Component Value Date   WBC 19.9 (H) 11/22/2016   HGB 17.1 (H) 11/22/2016   HCT 50.6 11/22/2016   MCV 93.5 11/22/2016   PLT 167 11/22/2016   CMP     Component Value Date/Time   NA 137 11/22/2016 1054   K 4.1 11/22/2016 1054   CL 103 11/22/2016 1054   CO2 25 11/22/2016 1054   GLUCOSE 95 11/22/2016 1054   BUN 29 (H) 11/22/2016 1054   CREATININE 3.24 (H) 11/22/2016 1054   CALCIUM 9.7 11/22/2016 1054   PROT 7.3 11/22/2016 1054   ALBUMIN 4.4 11/22/2016 1054   AST 23 11/22/2016 1054   ALT 21 11/22/2016 1054   ALKPHOS 65 11/22/2016 1054   BILITOT 0.9 11/22/2016 1054   GFRNONAA 18 (L) 11/22/2016 1054   GFRAA 21 (L) 11/22/2016 1054   RADIOLOGY: I have reviewed the images below and agree with the reported results  Study Result   CLINICAL DATA:  Right renal neoplasm.  EXAM: CHEST  2 VIEW  COMPARISON:  08/29/2015.  FINDINGS: Mediastinum and hilar structures normal. Lungs are clear. Heart size normal. No pleural effusion or pneumothorax. Biapical pleural parenchymal thickening consistent with scarring. Degenerative changes thoracic spine.  IMPRESSION: No acute cardiopulmonary disease.   Electronically Signed   By: Marcello Moores  Register   On: 08/27/2016 09:29         JAK2 V617F, Rfx CALR/E12/MPL  Order: [id number]   Status:  Edited Result - FINAL Visible to patient:  Yes (MyChart) Next appt:  None Dx:  Leukocytosis   34mo ago  JAK2 GenotypR Comment  Comments: (NOTE)  Result: NEGATIVE for the JAK2 V617F mutation.  Interpretation: The G to T nucleotide change encoding the V617F  mutation was not detected. This result does not rule out the  presence  of the JAK2  mutation at a level below the sensitivity of  detection of this assay, or the presence of other mutations within  JAK2 not detected by this assay. This result does not rule out a  diagnosis of polycythemia vera, essential thrombocythemia or  idiopathic myelofibrosis as the V617F mutation is not detected in all  patients with these disorders.   BACKGROUND: Comment  Comments: (NOTE)  JAK2 is a cytoplasmic tyrosine kinase with a key role in signal  transduction from multiple hematopoietic growth factor receptors.  A point mutation within exon 14 of the JAK2 gene ([id number]) encoding a  valine to phenylalanine substitution at position 617 of the JAK2  protein (V617F) has been identified in most patients with  polycythemia vera, and in about half of those with either essential  thrombocythemia or idiopathic myelofibrosis. The V617F has also been  detected, although infrequently, in other myeloid disorders such as  chronic myelomonocytic leukemia and chronic neutrophilic leukemia.  V617F is an acquired mutation that alters a highly conserved valine  present in the negative regulatory JH2 domain of the JAK2 protein and  is predicted to dysregulate kinase activity.  Methodology:  Genomic DNA was purified from the provided specimen. Allele-  specific PCR using fluorescent primers was used to simultaneously  amplify both the wild type and mutant alleles. Amplification products  were analyzed by capillary electrophoresis. This assay has a  sensitivity to detect approximately a 5% population of cells  containing the V617F mutation in a background of non-mutant cells.  Reference:  Tefferi A and Gilliland DG. The JAK2 V617F Tyrosine Kinase  Mutation in Myeloproliferative Disorders: Status Report and  Immediate Implications for Disease Classification and Diagnosis.  Mayo Clin Proc 2005;80(7):947-958.   Director Review, JAK2 Comment  Comments: (NOTE)  Jacklynn Bue MS, PhD, [facility]  [place], [place]  [phone]   REFLEX: Comment  Comments: (NOTE)  Reflex to CALR Mutation Analysis, JAK2 Exon 12 Mutation Analysis, and  MPL Mutation Analysis is indicated.  Performed At: [facility]  [address] [place], [place] [id number]  Nechama Guard MD MB:[id number]  Performed At: [facility]  [address] 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 Results are noted above.   He has significant CKD, stage IV but normal H/H. I am not sure if this is secondary to his tobacco use and "seconary polycythemia."  I did an MPD evaluation at his initial visit and found a CALR mutation:  CALR mutations have been observed in approximately 70 percent of patients with ET or PMF who do not carry a mutation in either JAK2 or MPL, and have only rarely been observed in patients with PV; all known CALR mutations involve frameshift mutations within exon 9 that generate a mutant protein with a novel C-terminus  The significance of JAK2, MPL, CALR, and other mutations in the genesis of the MPNs as well as their relative roles in  determining disease phenotype, leukemic transformation, and the level of involvement of stem cells in these disorders are unclear at present   Note it may be of uncertain significance at this point.   I did offer the patient a second opinion at [facility] with Dr. Florene Glen. He notes that  currently he is not interested.   I encouraged him to quit smoking. Smoking cessation was discussed in detail.   He will contact our clinic if he notices any new adenopathy, if his appetite changes, night sweats or change in energy.   He will return for follow up in 6 months with repeat labs.   Orders Placed This Encounter  Procedures  . CBC with Differential    Standing Status:   Future    Standing Expiration Date:   07/23/2017  . Comprehensive metabolic panel    Standing Status:   Future    Standing Expiration Date:   07/23/2017  . Lactate dehydrogenase    Standing Status:   Future    Standing Expiration Date:   07/23/2017    All questions were answered. The patient knows to call the clinic with any problems, questions or concerns.  This document serves as a record of services personally performed by Ancil Linsey, MD. It was created on her behalf by Shirlean Mylar, a trained medical scribe. The creation of this record is based on the scribe's personal observations and the provider's statements to them. This document has been checked and approved by the attending provider.  I have reviewed the above documentation for accuracy and completeness, and I agree with the above.  This note was electronically signed.  Molli Hazard, MD  12/04/2016 5:46 PM

## 2016-11-22 NOTE — Patient Instructions (Signed)
[facility] at [facility] Discharge Instructions  RECOMMENDATIONS MADE BY THE CONSULTANT AND ANY TEST RESULTS WILL BE SENT TO YOUR REFERRING PHYSICIAN.  You were seen today by Dr. Whitney Muse Follow up in 6 months with lab work   Thank you for choosing [facility] at [facility] to provide your oncology and hematology care.  To afford each patient quality time with our provider, please arrive at least 15 minutes before your scheduled appointment time.    If you have a lab appointment with the [facility] please come in thru the  Main Entrance and check in at the main information desk  You need to re-schedule your appointment should you arrive 10 or more minutes late.  We strive to give you quality time with our providers, and arriving late affects you and other patients whose appointments are after yours.  Also, if you no show three or more times for appointments you may be dismissed from the clinic at the providers discretion.     Again, thank you for choosing [facility].  Our hope is that these requests will decrease the amount of time that you wait before being seen by our physicians.       _____________________________________________________________  Should you have questions after your visit to [facility], please contact our office at [phone] between the hours of 8:30 a.m. and 4:30 p.m.  Voicemails left after 4:30 p.m. will not be returned until the following business day.  For prescription refill requests, have your pharmacy contact our office.       Resources For Cancer Patients and their Caregivers ? American Cancer Society: Can assist with transportation, wigs, general needs, runs Look Good Feel Better.        850-857-5799 ? Cancer Care: Provides financial assistance, online support groups, medication/co-pay assistance.  1-800-813-HOPE 541-785-5766) ? [place] Assists  [employer] cancer patients and their families through emotional , educational and financial support.  236-770-9494 ? [place] DSS Where to apply for food stamps, Medicaid and utility assistance. 240-078-3847 ? [facility]: Transportation to medical appointments. 325-331-1136 ? Social Security Administration: May apply for disability if have a Stage IV cancer. 531-197-0721 (213)325-8453 ? [employer], Disability and Transit Services: Assists with nutrition, care and transit needs. [place] Support Programs: @10RELATIVEDAYS @ > Cancer Support Group  2nd Tuesday of the month 1pm-2pm, Journey Room  > Creative Journey  3rd Tuesday of the month 1130am-1pm, Journey Room  > Look Good Feel Better  1st Wednesday of the month 10am-12 noon, Journey Room (Call [place] to register [phone])

## 2016-12-04 ENCOUNTER — Encounter (HOSPITAL_COMMUNITY): Payer: Self-pay | Admitting: Hematology & Oncology

## 2017-01-16 DIAGNOSIS — N400 Enlarged prostate without lower urinary tract symptoms: Secondary | ICD-10-CM | POA: Diagnosis not present

## 2017-01-16 DIAGNOSIS — E782 Mixed hyperlipidemia: Secondary | ICD-10-CM | POA: Diagnosis not present

## 2017-01-16 DIAGNOSIS — I10 Essential (primary) hypertension: Secondary | ICD-10-CM | POA: Diagnosis not present

## 2017-01-21 DIAGNOSIS — Z8553 Personal history of malignant neoplasm of renal pelvis: Secondary | ICD-10-CM | POA: Diagnosis not present

## 2017-01-21 DIAGNOSIS — D72829 Elevated white blood cell count, unspecified: Secondary | ICD-10-CM | POA: Diagnosis not present

## 2017-01-21 DIAGNOSIS — Z6824 Body mass index (BMI) 24.0-24.9, adult: Secondary | ICD-10-CM | POA: Diagnosis not present

## 2017-01-21 DIAGNOSIS — N400 Enlarged prostate without lower urinary tract symptoms: Secondary | ICD-10-CM | POA: Diagnosis not present

## 2017-01-21 DIAGNOSIS — E785 Hyperlipidemia, unspecified: Secondary | ICD-10-CM | POA: Diagnosis not present

## 2017-03-19 DIAGNOSIS — E559 Vitamin D deficiency, unspecified: Secondary | ICD-10-CM | POA: Diagnosis not present

## 2017-03-19 DIAGNOSIS — Z79899 Other long term (current) drug therapy: Secondary | ICD-10-CM | POA: Diagnosis not present

## 2017-03-19 DIAGNOSIS — D649 Anemia, unspecified: Secondary | ICD-10-CM | POA: Diagnosis not present

## 2017-03-19 DIAGNOSIS — I10 Essential (primary) hypertension: Secondary | ICD-10-CM | POA: Diagnosis not present

## 2017-03-19 DIAGNOSIS — N183 Chronic kidney disease, stage 3 (moderate): Secondary | ICD-10-CM | POA: Diagnosis not present

## 2017-03-26 DIAGNOSIS — R809 Proteinuria, unspecified: Secondary | ICD-10-CM | POA: Diagnosis not present

## 2017-03-26 DIAGNOSIS — I10 Essential (primary) hypertension: Secondary | ICD-10-CM | POA: Diagnosis not present

## 2017-03-26 DIAGNOSIS — N184 Chronic kidney disease, stage 4 (severe): Secondary | ICD-10-CM | POA: Diagnosis not present

## 2017-03-26 DIAGNOSIS — E872 Acidosis: Secondary | ICD-10-CM | POA: Diagnosis not present

## 2017-05-03 DIAGNOSIS — Z6824 Body mass index (BMI) 24.0-24.9, adult: Secondary | ICD-10-CM | POA: Diagnosis not present

## 2017-05-03 DIAGNOSIS — L309 Dermatitis, unspecified: Secondary | ICD-10-CM | POA: Diagnosis not present

## 2017-05-22 ENCOUNTER — Ambulatory Visit (HOSPITAL_COMMUNITY): Payer: Medicare Other

## 2017-05-22 ENCOUNTER — Encounter (HOSPITAL_COMMUNITY): Payer: Self-pay | Admitting: Oncology

## 2017-05-22 ENCOUNTER — Encounter (HOSPITAL_COMMUNITY): Payer: Medicare Other | Attending: Oncology

## 2017-05-22 ENCOUNTER — Encounter (HOSPITAL_BASED_OUTPATIENT_CLINIC_OR_DEPARTMENT_OTHER): Payer: Medicare Other | Admitting: Oncology

## 2017-05-22 ENCOUNTER — Other Ambulatory Visit (HOSPITAL_COMMUNITY): Payer: Medicare Other

## 2017-05-22 VITALS — BP 125/86 | HR 78 | Resp 16 | Wt 169.0 lb

## 2017-05-22 DIAGNOSIS — C9190 Lymphoid leukemia, unspecified not having achieved remission: Secondary | ICD-10-CM | POA: Diagnosis not present

## 2017-05-22 DIAGNOSIS — Z720 Tobacco use: Secondary | ICD-10-CM | POA: Diagnosis not present

## 2017-05-22 DIAGNOSIS — N184 Chronic kidney disease, stage 4 (severe): Secondary | ICD-10-CM

## 2017-05-22 DIAGNOSIS — C9110 Chronic lymphocytic leukemia of B-cell type not having achieved remission: Secondary | ICD-10-CM

## 2017-05-22 LAB — COMPREHENSIVE METABOLIC PANEL
ALBUMIN: 4.2 g/dL (ref 3.5–5.0)
ALT: 19 U/L (ref 17–63)
AST: 22 U/L (ref 15–41)
Alkaline Phosphatase: 62 U/L (ref 38–126)
Anion gap: 11 (ref 5–15)
BILIRUBIN TOTAL: 0.8 mg/dL (ref 0.3–1.2)
BUN: 27 mg/dL — AB (ref 6–20)
CO2: 24 mmol/L (ref 22–32)
Calcium: 9.7 mg/dL (ref 8.9–10.3)
Chloride: 105 mmol/L (ref 101–111)
Creatinine, Ser: 3.26 mg/dL — ABNORMAL HIGH (ref 0.61–1.24)
GFR calc Af Amer: 21 mL/min — ABNORMAL LOW (ref >60)
GFR calc non Af Amer: 18 mL/min — ABNORMAL LOW (ref >60)
GLUCOSE: 87 mg/dL (ref 65–99)
POTASSIUM: 4.1 mmol/L (ref 3.5–5.1)
SODIUM: 140 mmol/L (ref 135–145)
TOTAL PROTEIN: 7.4 g/dL (ref 6.5–8.1)

## 2017-05-22 LAB — CBC WITH DIFFERENTIAL/PLATELET
BASOS ABS: 0 10*3/uL (ref 0.0–0.1)
Basophils Relative: 0 %
EOS ABS: 0.5 10*3/uL (ref 0.0–0.7)
Eosinophils Relative: 2 %
HEMATOCRIT: 47.6 % (ref 39.0–52.0)
HEMOGLOBIN: 16.3 g/dL (ref 13.0–17.0)
LYMPHS ABS: 18.2 10*3/uL — AB (ref 0.7–4.0)
LYMPHS PCT: 70 %
MCH: 32.1 pg (ref 26.0–34.0)
MCHC: 34.2 g/dL (ref 30.0–36.0)
MCV: 93.7 fL (ref 78.0–100.0)
MONOS PCT: 5 %
Monocytes Absolute: 1.3 10*3/uL — ABNORMAL HIGH (ref 0.1–1.0)
Neutro Abs: 6 10*3/uL (ref 1.7–7.7)
Neutrophils Relative %: 23 %
Platelets: 157 10*3/uL (ref 150–400)
RBC: 5.08 MIL/uL (ref 4.22–5.81)
RDW: 14.5 % (ref 11.5–15.5)
WBC: 26 10*3/uL — AB (ref 4.0–10.5)

## 2017-05-22 LAB — LACTATE DEHYDROGENASE: LDH: 121 U/L (ref 98–192)

## 2017-05-22 NOTE — Progress Notes (Signed)
[facility] NOTE  Patient Care Team: Celene Squibb, MD as PCP - General (Internal Medicine) Kathie Rhodes, MD as Consulting Physician (Urology) Ardis Hughs, MD as Attending Physician (Urology) Daneil Dolin, MD as Consulting Physician (Gastroenterology)  CHIEF COMPLAINTS/PURPOSE OF CONSULTATION:  CLL, RAI stage 0 CALR mutation CKD, stage IV R laparoscopic radical nephrectomy, papillary RCC, Fuhrman Grade III, spanning 2.2 cm, negative resection margins (pT1a,pNX) CT - stone protocol 08/29/2015 R nephrectomy w/o evidence of metastatic disease, multiple low and high attenuation lesions in the L kidney H/O obstructive uropathy with B percutaneous nephrostomy tubes S/P TURP Tobacco Use  HISTORY OF PRESENTING ILLNESS:  Keith Rivera 66 y.o. male is here because of CLL, he is also noted to have a CALR mutation. On chart review leukocytosis was present dating past to at least 2009. Other counts are preserved. He is a smoker, approximately 1ppd for 45 years. 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   Patient presents today for continued follow-up. He states that he has been doing well. He has no complaints today. He continues smoke 1 pack per day. He denies any chest pain, shortness breath, abdominal pain, worsening fatigue, drenching night sweats, unexplained weight loss.                                                                                                          MEDICAL HISTORY:  Past Medical History:  Diagnosis Date  . Anemia   . BPH (benign prostatic hyperplasia)   . Chronic kidney disease   . History of hydronephrosis   . Hyperlipidemia   . MVP (mitral valve prolapse)    Mild mitral regurgitation 2014   SURGICAL HISTORY: Past Surgical History:  Procedure Laterality Date  . CATARACT EXTRACTION W/PHACO Right 11/28/2015   Procedure: CATARACT EXTRACTION PHACO AND INTRAOCULAR LENS PLACEMENT (IOC);  Surgeon: Williams Che,  MD;  Location: [facility];  Service: Ophthalmology;  Laterality: Right;  CDE: 7.14  . COLONOSCOPY N/A 02/08/2015   Procedure: COLONOSCOPY;  Surgeon: Daneil Dolin, MD;  Location: [facility];  Service: Endoscopy;  Laterality: N/A;  200  . CYSTOSCOPY WITH RETROGRADE PYELOGRAM, URETEROSCOPY AND STENT PLACEMENT Bilateral 12/03/2014   Procedure: CYSTOSCOPY WITH BILATERAL RETROGRADE PYELOGRAM, BILATERAL  URETEROSCOPY ;  Surgeon: Claybon Jabs, MD;  Location: [facility];  Service: Urology;  Laterality: Bilateral;  . INGUINAL HERNIA REPAIR Left   . LAPAROSCOPIC NEPHRECTOMY Right 02/18/2015   Procedure: LAPAROSCOPIC RIGHT RADICAL NEPHRECTOMY;  Surgeon: Ardis Hughs, MD;  Location: [facility];  Service: Urology;  Laterality: Right;  . NEPHROSTOMY  2006   DUE TO ENLARGED PROSTATE  . TONSILLECTOMY    . TRANSURETHRAL RESECTION OF PROSTATE    . VASECTOMY      SOCIAL HISTORY: Social History   Social History  . Marital status: Married    Spouse name: N/A  . Number of children: N/A  . Years of education: N/A   Occupational History  . Not on file.   Social History Main Topics  .  Smoking status: Current Every Day Smoker    Packs/day: 1.00    Years: 40.00    Types: Cigarettes    Start date: 06/29/1971  . Smokeless tobacco: Never Used  . Alcohol use 0.0 oz/week     Comment: Rare  . Drug use: No  . Sexual activity: No   Other Topics Concern  . Not on file   Social History Narrative  . No narrative on file  Married for 42 years 2 dogs no kids Smokes pack a day; started at 38 years old Doesn't drink ETOH normally Hobbies: firefighter Works as a Writer in [place]: Family History  Problem Relation Age of Onset  . Heart attack Father        Died age 79  . Colon cancer Maternal Grandfather        In his 63s  Mother died at 45 from pneumonia. Father died at 31 from heart attack. 1 sister-Healthy Grandfather on Mother side died of colon cancer.    ALLERGIES:  has No Known Allergies.  MEDICATIONS:  Current Outpatient Prescriptions  Medication Sig Dispense Refill  . atorvastatin (LIPITOR) 40 MG tablet Take 20 mg by mouth daily.     . cholecalciferol (VITAMIN D) 1000 UNITS tablet Take 1,000 Units by mouth every morning.     . Coenzyme Q10 (CO Q10 PO) Take 1 tablet by mouth at bedtime.     Marland Kitchen doxycycline (PERIOSTAT) 20 MG tablet     . Multiple Vitamin (MULTIVITAMIN WITH MINERALS) TABS tablet Take 1 tablet by mouth every morning.     . sodium bicarbonate 650 MG tablet Take 650 mg by mouth 3 (three) times daily.     . tamsulosin (FLOMAX) 0.4 MG CAPS capsule Take 1 capsule by mouth daily.     No current facility-administered medications for this visit.     Review of Systems  Constitutional: Negative.  Negative for chills, diaphoresis, fever, malaise/fatigue and weight loss.       Appetite is good  HENT: Negative.  Negative for congestion, hearing loss, nosebleeds, sore throat and tinnitus.   Eyes: Negative.  Negative for blurred vision, double vision, pain and discharge.  Respiratory: Negative.  Negative for cough, hemoptysis, sputum production, shortness of breath and wheezing.   Cardiovascular: Negative.  Negative for chest pain, palpitations, claudication, leg swelling and PND.  Gastrointestinal: Negative.  Negative for abdominal pain, blood in stool, constipation, diarrhea, heartburn, melena, nausea and vomiting.  Genitourinary: Negative.  Negative for dysuria, frequency, hematuria and urgency.  Musculoskeletal: Negative.  Negative for falls, joint pain and myalgias.  Skin: Negative.  Negative for itching and rash.  Neurological: Negative.  Negative for dizziness, tingling, tremors, sensory change, speech change, focal weakness, seizures, loss of consciousness, weakness and headaches.       Denies drenching night sweats  Endo/Heme/Allergies: Negative.  Does not bruise/bleed easily.  Psychiatric/Behavioral: Negative.  Negative for  depression, memory loss, substance abuse and suicidal ideas. The patient is not nervous/anxious and does not have insomnia.   All other systems reviewed and are negative. 14 point ROS was done and is otherwise as detailed above or in HPI   PHYSICAL EXAMINATION: ECOG PERFORMANCE STATUS: 0 - Asymptomatic  Vitals:   05/22/17 1106  BP: 125/86  Pulse: 78  Resp: 16   Filed Weights   05/22/17 1106  Weight: 169 lb (76.7 kg)      Physical Exam  Constitutional: He is oriented to person, place, and time and  well-developed, well-nourished, and in no distress.  HENT:  Head: Normocephalic and atraumatic.  Nose: Nose normal.  Mouth/Throat: Oropharynx is clear and moist. No oropharyngeal exudate.  Eyes: Pupils are equal, round, and reactive to light. Conjunctivae and EOM are normal. Right eye exhibits no discharge. Left eye exhibits no discharge. No scleral icterus.  Neck: Normal range of motion. Neck supple. No tracheal deviation present. No thyromegaly present.  Cardiovascular: Normal rate and normal heart sounds.  Exam reveals no gallop and no friction rub.   No murmur heard. Pulmonary/Chest: Effort normal and breath sounds normal. He has no wheezes. He has no rales.  Abdominal: Soft. Bowel sounds are normal. He exhibits no distension and no mass. There is no tenderness. There is no rebound and no guarding.  Musculoskeletal: Normal range of motion. He exhibits no edema.  Lymphadenopathy:    He has no cervical adenopathy.  Neurological: He is alert and oriented to person, place, and time. He has normal reflexes. No cranial nerve deficit. Gait normal. Coordination normal.  Skin: Skin is warm and dry. No rash noted.  Psychiatric: Mood, memory, affect and judgment normal.  Nursing note and vitals reviewed.   LABORATORY DATA:  I have reviewed the data as listed Lab Results  Component Value Date   WBC 26.0 (H) 05/22/2017   HGB 16.3 05/22/2017   HCT 47.6 05/22/2017   MCV 93.7 05/22/2017     PLT 157 05/22/2017   CMP     Component Value Date/Time   NA 140 05/22/2017 1043   K 4.1 05/22/2017 1043   CL 105 05/22/2017 1043   CO2 24 05/22/2017 1043   GLUCOSE 87 05/22/2017 1043   BUN 27 (H) 05/22/2017 1043   CREATININE 3.26 (H) 05/22/2017 1043   CALCIUM 9.7 05/22/2017 1043   PROT 7.4 05/22/2017 1043   ALBUMIN 4.2 05/22/2017 1043   AST 22 05/22/2017 1043   ALT 19 05/22/2017 1043   ALKPHOS 62 05/22/2017 1043   BILITOT 0.8 05/22/2017 1043   GFRNONAA 18 (L) 05/22/2017 1043   GFRAA 21 (L) 05/22/2017 1043   RADIOLOGY: I have reviewed the images below and agree with the reported results  Study Result   CLINICAL DATA:  Right renal neoplasm.  EXAM: CHEST  2 VIEW  COMPARISON:  08/29/2015.  FINDINGS: Mediastinum and hilar structures normal. Lungs are clear. Heart size normal. No pleural effusion or pneumothorax. Biapical pleural parenchymal thickening consistent with scarring. Degenerative changes thoracic spine.  IMPRESSION: No acute cardiopulmonary disease.   Electronically Signed   By: Marcello Moores  Register   On: 08/27/2016 09:29         JAK2 V617F, Rfx CALR/E12/MPL  Order: [id number]   Status:  Edited Result - FINAL Visible to patient:  Yes (MyChart) Next appt:  None Dx:  Leukocytosis   74mo ago  JAK2 GenotypR Comment  Comments: (NOTE)  Result: NEGATIVE for the JAK2 V617F mutation.  Interpretation: The G to T nucleotide change encoding the V617F  mutation was not detected. This result does not rule out the  presence of the JAK2 mutation at a level below the sensitivity of  detection of this assay, or the presence of other mutations within  JAK2 not detected by this assay. This result does not rule out a  diagnosis of polycythemia vera, essential thrombocythemia or  idiopathic myelofibrosis as the V617F mutation is not detected in all  patients with these disorders.   BACKGROUND: Comment  Comments: (NOTE)  JAK2 is a cytoplasmic tyrosine  kinase with  a key role in signal  transduction from multiple hematopoietic growth factor receptors.  A point mutation within exon 14 of the JAK2 gene ([id number]) encoding a  valine to phenylalanine substitution at position 617 of the JAK2  protein (V617F) has been identified in most patients with  polycythemia vera, and in about half of those with either essential  thrombocythemia or idiopathic myelofibrosis. The V617F has also been  detected, although infrequently, in other myeloid disorders such as  chronic myelomonocytic leukemia and chronic neutrophilic leukemia.  V617F is an acquired mutation that alters a highly conserved valine  present in the negative regulatory JH2 domain of the JAK2 protein and  is predicted to dysregulate kinase activity.  Methodology:  Genomic DNA was purified from the provided specimen. Allele-  specific PCR using fluorescent primers was used to simultaneously  amplify both the wild type and mutant alleles. Amplification products  were analyzed by capillary electrophoresis. This assay has a  sensitivity to detect approximately a 5% population of cells  containing the V617F mutation in a background of non-mutant cells.  Reference:  Tefferi A and Gilliland DG. The JAK2 V617F Tyrosine Kinase  Mutation in Myeloproliferative Disorders: Status Report and  Immediate Implications for Disease Classification and Diagnosis.  [facility] Clin Proc 2005;80(7):947-958.   Director Review, JAK2 Comment  Comments: (NOTE)  Jacklynn Bue MS, PhD, [facility]  [place], [place]  [phone]   REFLEX: Comment  Comments: (NOTE)  Reflex to CALR Mutation Analysis, JAK2 Exon 12 Mutation Analysis, and  MPL Mutation Analysis is indicated.  Performed At: [facility]  [address] [place], [place] [id number]  Nechama Guard MD DJ:[id number]  Performed At: [facility]  [address] [place], [place] [id number]  Nechama Guard MD TM:[id number]         PATHOLOGY:   ASSESSMENT & PLAN:  CLL, Rai stage 0, BINET stage A CALR mutation CKD, stage IV R laparoscopic radical nephrectomy, papillary RCC, Fuhrman Grade III, spanning 2.2 cm, negative resection margins (pT1a,pNX) CT - stone protocol 08/29/2015 R nephrectomy w/o evidence of metastatic disease, multiple low and high attenuation lesions in the L kidney H/O obstructive uropathy with B percutaneous nephrostomy tubes S/P TURP Tobacco Use  CALR mutations have been observed in approximately 70 percent of patients with ET or PMF who do not carry a mutation in either JAK2 or MPL, and have only rarely been observed in patients with PV; all known CALR mutations involve frameshift mutations within exon 9 that generate a mutant protein with a novel C-terminus  The significance of JAK2, MPL, CALR, and other mutations in the genesis of the MPNs as well as their relative roles in determining disease phenotype, leukemic transformation, and the level of involvement of stem cells in these disorders are unclear at present   Reviewed labwork with the patient. Mild increase in leukocytosis but no worsening thrombocytopenia or anemia. RTC in 4 months for follow up with labs   Orders Placed This Encounter  Procedures  . CBC with Differential    Standing Status:   Future    Standing Expiration Date:   05/22/2018  . Comprehensive metabolic panel    Standing Status:   Future    Standing Expiration Date:   05/22/2018    All questions were answered. The patient knows to call the clinic with any problems, questions or concerns.   This note was electronically signed.  Barbaraann Share  Talbert Cage, MD  05/22/2017 11:55 AM

## 2017-05-22 NOTE — Patient Instructions (Signed)
[facility] at [facility] Discharge Instructions  RECOMMENDATIONS MADE BY THE CONSULTANT AND ANY TEST RESULTS WILL BE SENT TO YOUR REFERRING PHYSICIAN.  Return in about 4 months ( around 09/22/2017 ) Labs on next visit Thank you for choosing [place] at [facility] to provide your oncology and hematology care.  To afford each patient quality time with our provider, please arrive at least 15 minutes before your scheduled appointment time.    If you have a lab appointment with the [facility] please come in thru the  Main Entrance and check in at the main information desk  You need to re-schedule your appointment should you arrive 10 or more minutes late.  We strive to give you quality time with our providers, and arriving late affects you and other patients whose appointments are after yours.  Also, if you no show three or more times for appointments you may be dismissed from the clinic at the providers discretion.     Again, thank you for choosing [facility].  Our hope is that these requests will decrease the amount of time that you wait before being seen by our physicians.       _____________________________________________________________  Should you have questions after your visit to [facility], please contact our office at [phone] between the hours of 8:30 a.m. and 4:30 p.m.  Voicemails left after 4:30 p.m. will not be returned until the following business day.  For prescription refill requests, have your pharmacy contact our office.       Resources For Cancer Patients and their Caregivers ? American Cancer Society: Can assist with transportation, wigs, general needs, runs Look Good Feel Better.        862-654-6983 ? Cancer Care: Provides financial assistance, online support groups, medication/co-pay assistance.  1-800-813-HOPE 407-384-5914) ? [place] Assists [place]  cancer patients and their families through emotional , educational and financial support.  712-840-5366 ? [place] DSS Where to apply for food stamps, Medicaid and utility assistance. (912) 803-8485 ? [facility]: Transportation to medical appointments. 2405580733 ? Social Security Administration: May apply for disability if have a Stage IV cancer. 970-599-3939 956-746-2071 ? [employer], Disability and Transit Services: Assists with nutrition, care and transit needs. [facility] Support Programs: @10RELATIVEDAYS @ > Cancer Support Group  2nd Tuesday of the month 1pm-2pm, Journey Room  > Creative Journey  3rd Tuesday of the month 1130am-1pm, Journey Room  > Look Good Feel Better  1st Wednesday of the month 10am-12 noon, Journey Room (Call [facility] to register [phone])

## 2017-06-19 DIAGNOSIS — D509 Iron deficiency anemia, unspecified: Secondary | ICD-10-CM | POA: Diagnosis not present

## 2017-06-19 DIAGNOSIS — Z79899 Other long term (current) drug therapy: Secondary | ICD-10-CM | POA: Diagnosis not present

## 2017-06-19 DIAGNOSIS — I10 Essential (primary) hypertension: Secondary | ICD-10-CM | POA: Diagnosis not present

## 2017-06-19 DIAGNOSIS — E559 Vitamin D deficiency, unspecified: Secondary | ICD-10-CM | POA: Diagnosis not present

## 2017-06-19 DIAGNOSIS — N183 Chronic kidney disease, stage 3 (moderate): Secondary | ICD-10-CM | POA: Diagnosis not present

## 2017-06-19 DIAGNOSIS — R809 Proteinuria, unspecified: Secondary | ICD-10-CM | POA: Diagnosis not present

## 2017-06-25 DIAGNOSIS — E872 Acidosis: Secondary | ICD-10-CM | POA: Diagnosis not present

## 2017-06-25 DIAGNOSIS — R809 Proteinuria, unspecified: Secondary | ICD-10-CM | POA: Diagnosis not present

## 2017-06-25 DIAGNOSIS — N184 Chronic kidney disease, stage 4 (severe): Secondary | ICD-10-CM | POA: Diagnosis not present

## 2017-06-25 DIAGNOSIS — I10 Essential (primary) hypertension: Secondary | ICD-10-CM | POA: Diagnosis not present

## 2017-07-24 DIAGNOSIS — I10 Essential (primary) hypertension: Secondary | ICD-10-CM | POA: Diagnosis not present

## 2017-07-26 DIAGNOSIS — Z0001 Encounter for general adult medical examination with abnormal findings: Secondary | ICD-10-CM | POA: Diagnosis not present

## 2017-07-26 DIAGNOSIS — C9110 Chronic lymphocytic leukemia of B-cell type not having achieved remission: Secondary | ICD-10-CM | POA: Diagnosis not present

## 2017-07-26 DIAGNOSIS — N400 Enlarged prostate without lower urinary tract symptoms: Secondary | ICD-10-CM | POA: Diagnosis not present

## 2017-07-26 DIAGNOSIS — Z8553 Personal history of malignant neoplasm of renal pelvis: Secondary | ICD-10-CM | POA: Diagnosis not present

## 2017-07-26 DIAGNOSIS — E785 Hyperlipidemia, unspecified: Secondary | ICD-10-CM | POA: Diagnosis not present

## 2017-08-03 DIAGNOSIS — Z23 Encounter for immunization: Secondary | ICD-10-CM | POA: Diagnosis not present

## 2017-08-07 DIAGNOSIS — R195 Other fecal abnormalities: Secondary | ICD-10-CM | POA: Diagnosis not present

## 2017-08-12 DIAGNOSIS — L821 Other seborrheic keratosis: Secondary | ICD-10-CM | POA: Diagnosis not present

## 2017-08-12 DIAGNOSIS — D485 Neoplasm of uncertain behavior of skin: Secondary | ICD-10-CM | POA: Diagnosis not present

## 2017-08-12 DIAGNOSIS — L570 Actinic keratosis: Secondary | ICD-10-CM | POA: Diagnosis not present

## 2017-08-12 DIAGNOSIS — D225 Melanocytic nevi of trunk: Secondary | ICD-10-CM | POA: Diagnosis not present

## 2017-08-29 DIAGNOSIS — D485 Neoplasm of uncertain behavior of skin: Secondary | ICD-10-CM | POA: Diagnosis not present

## 2017-09-12 DIAGNOSIS — L0390 Cellulitis, unspecified: Secondary | ICD-10-CM | POA: Diagnosis not present

## 2017-09-12 DIAGNOSIS — Z7689 Persons encountering health services in other specified circumstances: Secondary | ICD-10-CM | POA: Diagnosis not present

## 2017-09-24 DIAGNOSIS — Z125 Encounter for screening for malignant neoplasm of prostate: Secondary | ICD-10-CM | POA: Diagnosis not present

## 2017-09-24 DIAGNOSIS — R972 Elevated prostate specific antigen [PSA]: Secondary | ICD-10-CM | POA: Diagnosis not present

## 2017-09-24 DIAGNOSIS — C642 Malignant neoplasm of left kidney, except renal pelvis: Secondary | ICD-10-CM | POA: Diagnosis not present

## 2017-09-27 ENCOUNTER — Encounter (HOSPITAL_COMMUNITY): Payer: Medicare Other | Attending: Oncology

## 2017-09-27 ENCOUNTER — Encounter (HOSPITAL_COMMUNITY): Payer: Medicare Other | Admitting: Oncology

## 2017-09-27 ENCOUNTER — Encounter (HOSPITAL_COMMUNITY): Payer: Self-pay

## 2017-09-27 VITALS — BP 124/79 | HR 77 | Temp 98.6°F | Resp 18 | Wt 173.6 lb

## 2017-09-27 DIAGNOSIS — C9110 Chronic lymphocytic leukemia of B-cell type not having achieved remission: Secondary | ICD-10-CM

## 2017-09-27 DIAGNOSIS — N184 Chronic kidney disease, stage 4 (severe): Secondary | ICD-10-CM | POA: Diagnosis not present

## 2017-09-27 DIAGNOSIS — Z720 Tobacco use: Secondary | ICD-10-CM

## 2017-09-27 DIAGNOSIS — C9190 Lymphoid leukemia, unspecified not having achieved remission: Secondary | ICD-10-CM | POA: Diagnosis not present

## 2017-09-27 LAB — CBC WITH DIFFERENTIAL/PLATELET
BASOS ABS: 0 10*3/uL (ref 0.0–0.1)
BASOS PCT: 0 %
EOS ABS: 0.6 10*3/uL (ref 0.0–0.7)
Eosinophils Relative: 2 %
HCT: 49.7 % (ref 39.0–52.0)
Hemoglobin: 16 g/dL (ref 13.0–17.0)
Lymphocytes Relative: 82 %
Lymphs Abs: 23.1 10*3/uL — ABNORMAL HIGH (ref 0.7–4.0)
MCH: 31.1 pg (ref 26.0–34.0)
MCHC: 32.2 g/dL (ref 30.0–36.0)
MCV: 96.5 fL (ref 78.0–100.0)
MONO ABS: 0.6 10*3/uL (ref 0.1–1.0)
Monocytes Relative: 2 %
NEUTROS ABS: 3.9 10*3/uL (ref 1.7–7.7)
NEUTROS PCT: 14 %
PLATELETS: 180 10*3/uL (ref 150–400)
RBC: 5.15 MIL/uL (ref 4.22–5.81)
RDW: 14.9 % (ref 11.5–15.5)
WBC: 28.2 10*3/uL — ABNORMAL HIGH (ref 4.0–10.5)

## 2017-09-27 LAB — COMPREHENSIVE METABOLIC PANEL
ALBUMIN: 4.4 g/dL (ref 3.5–5.0)
ALT: 29 U/L (ref 17–63)
ANION GAP: 10 (ref 5–15)
AST: 28 U/L (ref 15–41)
Alkaline Phosphatase: 72 U/L (ref 38–126)
BUN: 29 mg/dL — ABNORMAL HIGH (ref 6–20)
CALCIUM: 9.7 mg/dL (ref 8.9–10.3)
CHLORIDE: 106 mmol/L (ref 101–111)
CO2: 22 mmol/L (ref 22–32)
Creatinine, Ser: 3.4 mg/dL — ABNORMAL HIGH (ref 0.61–1.24)
GFR calc non Af Amer: 17 mL/min — ABNORMAL LOW (ref >60)
GFR, EST AFRICAN AMERICAN: 20 mL/min — AB (ref >60)
GLUCOSE: 77 mg/dL (ref 65–99)
POTASSIUM: 4 mmol/L (ref 3.5–5.1)
SODIUM: 138 mmol/L (ref 135–145)
Total Bilirubin: 0.7 mg/dL (ref 0.3–1.2)
Total Protein: 7.4 g/dL (ref 6.5–8.1)

## 2017-09-27 NOTE — Progress Notes (Signed)
[facility] [place] NOTE  Patient Care Team: Celene Squibb, MD as PCP - General (Internal Medicine) Kathie Rhodes, MD as Consulting Physician (Urology) Ardis Hughs, MD as Attending Physician (Urology) Daneil Dolin, MD as Consulting Physician (Gastroenterology)  CHIEF COMPLAINTS/PURPOSE OF CONSULTATION:  CLL, RAI stage 0 CALR mutation CKD, stage IV R laparoscopic radical nephrectomy, papillary RCC, Fuhrman Grade III, spanning 2.2 cm, negative resection margins (pT1a,pNX) CT - stone protocol 08/29/2015 R nephrectomy w/o evidence of metastatic disease, multiple low and high attenuation lesions in the L kidney H/O obstructive uropathy with B percutaneous nephrostomy tubes S/P TURP Tobacco Use  HISTORY OF PRESENTING ILLNESS:  Keith Rivera 66 y.o. male is here because of CLL, he is also noted to have a CALR mutation. On chart review leukocytosis was present dating past to at least 2009. Other counts are preserved. He is a smoker, approximately 1ppd for 45 years. 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   Patient presents today for continued follow-up. He states he had some skin cancers removed from his back, otherwise he has not had any new health issues. He states that he has been doing well. He has no complaints today. He continues smoke 1 pack per day. He denies any chest pain, shortness breath, abdominal pain, worsening fatigue, drenching night sweats, unexplained weight loss.                                                                                                          MEDICAL HISTORY:  Past Medical History:  Diagnosis Date  . Anemia   . BPH (benign prostatic hyperplasia)   . Chronic kidney disease   . History of hydronephrosis   . Hyperlipidemia   . MVP (mitral valve prolapse)    Mild mitral regurgitation 2014   SURGICAL HISTORY: Past Surgical History:  Procedure Laterality Date  . CATARACT EXTRACTION W/PHACO Right 11/28/2015    Procedure: CATARACT EXTRACTION PHACO AND INTRAOCULAR LENS PLACEMENT (IOC);  Surgeon: Williams Che, MD;  Location: [facility];  Service: Ophthalmology;  Laterality: Right;  CDE: 7.14  . COLONOSCOPY N/A 02/08/2015   Procedure: COLONOSCOPY;  Surgeon: Daneil Dolin, MD;  Location: [facility];  Service: Endoscopy;  Laterality: N/A;  200  . CYSTOSCOPY WITH RETROGRADE PYELOGRAM, URETEROSCOPY AND STENT PLACEMENT Bilateral 12/03/2014   Procedure: CYSTOSCOPY WITH BILATERAL RETROGRADE PYELOGRAM, BILATERAL  URETEROSCOPY ;  Surgeon: Claybon Jabs, MD;  Location: [facility];  Service: Urology;  Laterality: Bilateral;  . INGUINAL HERNIA REPAIR Left   . LAPAROSCOPIC NEPHRECTOMY Right 02/18/2015   Procedure: LAPAROSCOPIC RIGHT RADICAL NEPHRECTOMY;  Surgeon: Ardis Hughs, MD;  Location: [facility];  Service: Urology;  Laterality: Right;  . NEPHROSTOMY  2006   DUE TO ENLARGED PROSTATE  . TONSILLECTOMY    . TRANSURETHRAL RESECTION OF PROSTATE    . VASECTOMY      SOCIAL HISTORY: Social History   Socioeconomic History  . Marital status: Married    Spouse name: Not on file  . Number of children: 0  .  Years of education: Not on file  . Highest education level: Not on file  Social Needs  . Financial resource strain: Not hard at all  . Food insecurity - worry: Never true  . Food insecurity - inability: Never true  . Transportation needs - medical: No  . Transportation needs - non-medical: No  Occupational History  . Not on file  Tobacco Use  . Smoking status: Current Every Day Smoker    Packs/day: 1.00    Years: 40.00    Pack years: 40.00    Types: Cigarettes    Start date: 06/29/1971  . Smokeless tobacco: Never Used  Substance and Sexual Activity  . Alcohol use: Yes    Alcohol/week: 0.0 oz    Comment: Rare  . Drug use: No  . Sexual activity: No    Birth control/protection: None  Other Topics Concern  . Not on file  Social History Narrative  . Not on file  Married for 42 years 2 dogs no  kids Smokes pack a day; started at 31 years old Doesn't drink ETOH normally Hobbies: firefighter Works as a Writer in [place]: Family History  Problem Relation Age of Onset  . Heart attack Father        Died age 53  . Colon cancer Maternal Grandfather        In his 12s  Mother died at 34 from pneumonia. Father died at 64 from heart attack. 1 sister-Healthy Grandfather on Mother side died of colon cancer.   ALLERGIES:  has No Known Allergies.  MEDICATIONS:  Current Outpatient Medications  Medication Sig Dispense Refill  . atorvastatin (LIPITOR) 40 MG tablet Take 20 mg by mouth daily.     . cholecalciferol (VITAMIN D) 1000 UNITS tablet Take 1,000 Units by mouth every morning.     . Coenzyme Q10 (CO Q10 PO) Take 1 tablet by mouth at bedtime.     Marland Kitchen doxycycline (PERIOSTAT) 20 MG tablet     . Multiple Vitamin (MULTIVITAMIN WITH MINERALS) TABS tablet Take 1 tablet by mouth every morning.     . sodium bicarbonate 650 MG tablet Take 650 mg by mouth 2 (two) times daily.     . tamsulosin (FLOMAX) 0.4 MG CAPS capsule Take 1 capsule by mouth daily.     No current facility-administered medications for this visit.     Review of Systems  Constitutional: Negative.  Negative for chills, diaphoresis, fever, malaise/fatigue and weight loss.       Appetite is good  HENT: Negative.  Negative for congestion, hearing loss, nosebleeds, sore throat and tinnitus.   Eyes: Negative.  Negative for blurred vision, double vision, pain and discharge.  Respiratory: Negative.  Negative for cough, hemoptysis, sputum production, shortness of breath and wheezing.   Cardiovascular: Negative.  Negative for chest pain, palpitations, claudication, leg swelling and PND.  Gastrointestinal: Negative.  Negative for abdominal pain, blood in stool, constipation, diarrhea, heartburn, melena, nausea and vomiting.  Genitourinary: Negative.  Negative for dysuria, frequency, hematuria and  urgency.  Musculoskeletal: Negative.  Negative for falls, joint pain and myalgias.  Skin: Negative.  Negative for itching and rash.  Neurological: Negative.  Negative for dizziness, tingling, tremors, sensory change, speech change, focal weakness, seizures, loss of consciousness, weakness and headaches.       Denies drenching night sweats  Endo/Heme/Allergies: Negative.  Does not bruise/bleed easily.  Psychiatric/Behavioral: Negative.  Negative for depression, memory loss, substance abuse and suicidal ideas. The patient is  not nervous/anxious and does not have insomnia.   All other systems reviewed and are negative. 14 point ROS was done and is otherwise as detailed above or in HPI   PHYSICAL EXAMINATION: ECOG PERFORMANCE STATUS: 0 - Asymptomatic  Vitals:   09/27/17 1015  BP: 124/79  Pulse: 77  Resp: 18  Temp: 98.6 F (37 C)  SpO2: 100%   Filed Weights   09/27/17 1015  Weight: 173 lb 9.6 oz (78.7 kg)      Physical Exam  Constitutional: He is oriented to person, place, and time and well-developed, well-nourished, and in no distress.  HENT:  Head: Normocephalic and atraumatic.  Nose: Nose normal.  Mouth/Throat: Oropharynx is clear and moist. No oropharyngeal exudate.  Eyes: Conjunctivae and EOM are normal. Pupils are equal, round, and reactive to light. Right eye exhibits no discharge. Left eye exhibits no discharge. No scleral icterus.  Neck: Normal range of motion. Neck supple. No tracheal deviation present. No thyromegaly present.  Cardiovascular: Normal rate and normal heart sounds. Exam reveals no gallop and no friction rub.  No murmur heard. Pulmonary/Chest: Effort normal and breath sounds normal. He has no wheezes. He has no rales.  Abdominal: Soft. Bowel sounds are normal. He exhibits no distension and no mass. There is no tenderness. There is no rebound and no guarding.  Musculoskeletal: Normal range of motion. He exhibits no edema.  Lymphadenopathy:    He has no  cervical adenopathy.  Neurological: He is alert and oriented to person, place, and time. He has normal reflexes. No cranial nerve deficit. Gait normal. Coordination normal.  Skin: Skin is warm and dry. No rash noted.  Psychiatric: Mood, memory, affect and judgment normal.  Nursing note and vitals reviewed.   LABORATORY DATA:  I have reviewed the data as listed Lab Results  Component Value Date   WBC 28.2 (H) 09/27/2017   HGB 16.0 09/27/2017   HCT 49.7 09/27/2017   MCV 96.5 09/27/2017   PLT 180 09/27/2017   CMP     Component Value Date/Time   NA 138 09/27/2017 0924   K 4.0 09/27/2017 0924   CL 106 09/27/2017 0924   CO2 22 09/27/2017 0924   GLUCOSE 77 09/27/2017 0924   BUN 29 (H) 09/27/2017 0924   CREATININE 3.40 (H) 09/27/2017 0924   CALCIUM 9.7 09/27/2017 0924   PROT 7.4 09/27/2017 0924   ALBUMIN 4.4 09/27/2017 0924   AST 28 09/27/2017 0924   ALT 29 09/27/2017 0924   ALKPHOS 72 09/27/2017 0924   BILITOT 0.7 09/27/2017 0924   GFRNONAA 17 (L) 09/27/2017 0924   GFRAA 20 (L) 09/27/2017 0924   RADIOLOGY: I have reviewed the images below and agree with the reported results  Study Result   CLINICAL DATA:  Right renal neoplasm.  EXAM: CHEST  2 VIEW  COMPARISON:  08/29/2015.  FINDINGS: Mediastinum and hilar structures normal. Lungs are clear. Heart size normal. No pleural effusion or pneumothorax. Biapical pleural parenchymal thickening consistent with scarring. Degenerative changes thoracic spine.  IMPRESSION: No acute cardiopulmonary disease.   Electronically Signed   By: Marcello Moores  Register   On: 08/27/2016 09:29         JAK2 V617F, Rfx CALR/E12/MPL  Order: [id number]   Status:  Edited Result - FINAL Visible to patient:  Yes (MyChart) Next appt:  None Dx:  Leukocytosis   90mo ago  JAK2 GenotypR Comment  Comments: (NOTE)  Result: NEGATIVE for the JAK2 V617F mutation.  Interpretation: The G to T nucleotide change  encoding the V617F  mutation  was not detected. This result does not rule out the  presence of the JAK2 mutation at a level below the sensitivity of  detection of this assay, or the presence of other mutations within  JAK2 not detected by this assay. This result does not rule out a  diagnosis of polycythemia vera, essential thrombocythemia or  idiopathic myelofibrosis as the V617F mutation is not detected in all  patients with these disorders.   BACKGROUND: Comment  Comments: (NOTE)  JAK2 is a cytoplasmic tyrosine kinase with a key role in signal  transduction from multiple hematopoietic growth factor receptors.  A point mutation within exon 14 of the JAK2 gene ([id number]) encoding a  valine to phenylalanine substitution at position 617 of the JAK2  protein (V617F) has been identified in most patients with  polycythemia vera, and in about half of those with either essential  thrombocythemia or idiopathic myelofibrosis. The V617F has also been  detected, although infrequently, in other myeloid disorders such as  chronic myelomonocytic leukemia and chronic neutrophilic leukemia.  V617F is an acquired mutation that alters a highly conserved valine  present in the negative regulatory JH2 domain of the JAK2 protein and  is predicted to dysregulate kinase activity.  Methodology:  Genomic DNA was purified from the provided specimen. Allele-  specific PCR using fluorescent primers was used to simultaneously  amplify both the wild type and mutant alleles. Amplification products  were analyzed by capillary electrophoresis. This assay has a  sensitivity to detect approximately a 5% population of cells  containing the V617F mutation in a background of non-mutant cells.  Reference:  Tefferi A and Gilliland DG. The JAK2 V617F Tyrosine Kinase  Mutation in Myeloproliferative Disorders: Status Report and  Immediate Implications for Disease Classification and Diagnosis.  [facility] Clin Proc 2005;80(7):947-958.   Director Review,  JAK2 Comment  Comments: (NOTE)  Jacklynn Bue MS, PhD, [facility]  [place], [place]  [phone]   REFLEX: Comment  Comments: (NOTE)  Reflex to CALR Mutation Analysis, JAK2 Exon 12 Mutation Analysis, and  MPL Mutation Analysis is indicated.  Performed At: [facility]  [address] [place], [place] [id number]  Nechama Guard MD HB:[id number]  Performed At: [facility]  [address] [place], [place] [id number]  Nechama Guard MD [id number]         PATHOLOGY:   ASSESSMENT & PLAN:  CLL, Rai stage 0, BINET stage A CALR mutation CKD, stage IV R laparoscopic radical nephrectomy, papillary RCC, Fuhrman Grade III, spanning 2.2 cm, negative resection margins (pT1a,pNX) CT - stone protocol 08/29/2015 R nephrectomy w/o evidence of metastatic disease, multiple low and high attenuation lesions in the L kidney H/O obstructive uropathy with B percutaneous nephrostomy tubes S/P TURP Tobacco Use  CALR mutations have been observed in approximately 70 percent of patients with ET or PMF who do not carry a mutation in either JAK2 or MPL, and have only rarely been observed in patients with PV; all known CALR mutations involve frameshift mutations within exon 9 that generate a mutant protein with a novel C-terminus  The significance of JAK2, MPL, CALR, and other mutations in the genesis of the MPNs as well as their relative roles in determining disease phenotype, leukemic transformation, and the level of involvement of stem cells in these disorders are unclear at present   Reviewed labwork with the patient. Mild increase in leukocytosis from  26k to 28k in the past 4 months but no worsening thrombocytopenia or anemia. No B symptoms. Continue observation of his lab work at this time. No indication to start treatment for his CLL.  RTC in 6 months for follow up with labs   Orders Placed  This Encounter  Procedures  . CBC with Differential    Standing Status:   Future    Standing Expiration Date:   09/27/2018  . Comprehensive metabolic panel    Standing Status:   Future    Standing Expiration Date:   09/27/2018    All questions were answered. The patient knows to call the clinic with any problems, questions or concerns.   This note was electronically signed.  Twana First, MD  09/27/2017 11:29 AM

## 2017-09-27 NOTE — Patient Instructions (Signed)
[facility] Discharge Instructions  RECOMMENDATIONS MADE BY THE CONSULTANT AND ANY TEST RESULTS WILL BE SENT TO YOUR REFERRING PHYSICIAN.  You saw Dr. Zhou today.  Thank you for choosing [facility] to provide your oncology and hematology care.  To afford each patient quality time with our provider, please arrive at least 15 minutes before your scheduled appointment time.    If you have a lab appointment with the Cancer Center please come in thru the  Main Entrance and check in at the main information desk  You need to re-schedule your appointment should you arrive 10 or more minutes late.  We strive to give you quality time with our providers, and arriving late affects you and other patients whose appointments are after yours.  Also, if you no show three or more times for appointments you may be dismissed from the clinic at the providers discretion.     Again, thank you for choosing [facility].  Our hope is that these requests will decrease the amount of time that you wait before being seen by our physicians.       _____________________________________________________________  Should you have questions after your visit to [facility], please contact our office at [phone] between the hours of 8:30 a.m. and 4:30 p.m.  Voicemails left after 4:30 p.m. will not be returned until the following business day.  For prescription refill requests, have your pharmacy contact our office.       Resources For Cancer Patients and their Caregivers ? American Cancer Society: Can assist with transportation, wigs, general needs, runs Look Good Feel Better.        1-888-227-6333 ? Cancer Care: Provides financial assistance, online support groups, medication/co-pay assistance.  1-800-813-HOPE (4673) ? [facility] Assists [place] cancer patients and their families through  emotional , educational and financial support.  336-427-4357 ? [place] DSS Where to apply for food stamps, Medicaid and utility assistance. 336-342-1394 ? [facility]: Transportation to medical appointments. 336-347-2287 ? Social Security Administration: May apply for disability if have a Stage IV cancer. 336-342-7796 1-800-772-1213 ? [place] Aging, Disability and Transit Services: Assists with nutrition, care and transit needs. 336-349-2343  Cancer Center Support Programs: @[email or web address]@ > Cancer Support Group  2nd Tuesday of the month 1pm-2pm, Journey Room  > Creative Journey  3rd Tuesday of the month 1130am-1pm, Journey Room  > Look Good Feel Better  1st Wednesday of the month 10am-12 noon, Journey Room (Call American Cancer Society to register 1-800-395-5775)    

## 2017-10-03 DIAGNOSIS — N183 Chronic kidney disease, stage 3 (moderate): Secondary | ICD-10-CM | POA: Diagnosis not present

## 2017-10-03 DIAGNOSIS — D509 Iron deficiency anemia, unspecified: Secondary | ICD-10-CM | POA: Diagnosis not present

## 2017-10-03 DIAGNOSIS — I10 Essential (primary) hypertension: Secondary | ICD-10-CM | POA: Diagnosis not present

## 2017-10-03 DIAGNOSIS — Z79899 Other long term (current) drug therapy: Secondary | ICD-10-CM | POA: Diagnosis not present

## 2017-10-03 DIAGNOSIS — R809 Proteinuria, unspecified: Secondary | ICD-10-CM | POA: Diagnosis not present

## 2017-10-10 DIAGNOSIS — N184 Chronic kidney disease, stage 4 (severe): Secondary | ICD-10-CM | POA: Diagnosis not present

## 2017-10-10 DIAGNOSIS — R809 Proteinuria, unspecified: Secondary | ICD-10-CM | POA: Diagnosis not present

## 2017-10-10 DIAGNOSIS — E872 Acidosis: Secondary | ICD-10-CM | POA: Diagnosis not present

## 2017-10-10 DIAGNOSIS — I10 Essential (primary) hypertension: Secondary | ICD-10-CM | POA: Diagnosis not present

## 2017-12-31 ENCOUNTER — Encounter: Payer: Self-pay | Admitting: Internal Medicine

## 2018-01-13 ENCOUNTER — Ambulatory Visit (INDEPENDENT_AMBULATORY_CARE_PROVIDER_SITE_OTHER): Payer: Medicare Other

## 2018-01-13 DIAGNOSIS — Z86010 Personal history of colonic polyps: Secondary | ICD-10-CM

## 2018-01-13 MED ORDER — PEG 3350-KCL-NA BICARB-NACL 420 G PO SOLR: 4000.0000 mL | ORAL | 0 refills | Status: DC

## 2018-01-13 NOTE — Patient Instructions (Signed)
Keith Rivera   1951/02/28 MRN: [id number]    Procedure Date: 02/26/18 Time to register: 12:00 Place to register: [facility] Procedure Time: 1:00 Scheduled provider: R. Garfield Cornea, MD  PREPARATION FOR COLONOSCOPY WITH TRI-LYTE SPLIT PREP  Please notify us immediately if you are diabetic, take iron supplements, or if you are on Coumadin or any other blood thinners.     You will need to purchase 1 fleet enema and 1 box of Bisacodyl 73m tablets.   2 DAYS BEFORE PROCEDURE:  DATE: 02/24/18  DAY: Monday Begin clear liquid diet AFTER your lunch meal. NO SOLID FOODS after this point.  1 DAY BEFORE PROCEDURE:  DATE: 02/25/18  DAY: Tuesday Continue clear liquids the entire day - NO SOLID FOOD.     At 2:00 pm:  Take 2 Bisacodyl tablets.   At 4:00pm:  Start drinking your solution. Make sure you mix well per instructions on the bottle. Try to drink 1 (one) 8 ounce glass every 10-15 minutes until you have consumed HALF the jug. You should complete by 6:00pm.You must keep the left over solution refrigerated until completed next day.  Continue clear liquids. You must drink plenty of clear liquids to prevent dehyration and kidney failure. Nothing to eat or drink after midnight.  EXCEPTION: If you take medications for your heart, blood pressure or breathing, you may take these medications with a small amount of clear liquid.    DAY OF PROCEDURE:   DATE: 02/26/18   DAY: Wednesday    Five hours before your procedure time @ 8:00am:  Finish remaining amout of bowel prep, drinking 1 (one) 8 ounce glass every 10-15 minutes until complete. You have two hours to consume remaining prep.   Three hours before your procedure time _0 :00am:  Nothing by mouth.   At least one hour before going to the hospital:  Give yourself one Fleet enema. You may take your morning medications with sip of water unless we have instructed otherwise.      Please see below for Dietary Information.  CLEAR  LIQUIDS INCLUDE:  Water Jello (NOT red in color)   Ice Popsicles (NOT red in color)   Tea (sugar ok, no milk/cream) Powdered fruit flavored drinks  Coffee (sugar ok, no milk/cream) Gatorade/ Lemonade/ Kool-Aid  (NOT red in color)   Juice: apple, white grape, white cranberry Soft drinks  Clear bullion, consomme, broth (fat free beef/chicken/vegetable)  Carbonated beverages (any kind)  Strained chicken noodle soup Hard Candy   Remember: Clear liquids are liquids that will allow you to see your fingers on the other side of a clear glass. Be sure liquids are NOT red in color, and not cloudy, but CLEAR.  DO NOT EAT OR DRINK ANY OF THE FOLLOWING:  Dairy products of any kind   Cranberry juice Tomato juice / V8 juice   Grapefruit juice Orange juice     Red grape juice  Do not eat any solid foods, including such foods as: cereal, oatmeal, yogurt, fruits, vegetables, creamed soups, eggs, bread, crackers, pureed foods in a blender, etc.   HELPFUL HINTS FOR DRINKING PREP SOLUTION:   Make sure prep is extremely cold. Mix and refrigerate the the morning of the prep. You may also put in the freezer.   You may try mixing some Crystal Light or Country Time Lemonade if you prefer. Mix in small amounts; add more if necessary.  Try drinking through a straw  Rinse mouth with water or a mouthwash between glasses,  to remove after-taste.  Try sipping on a cold beverage /ice/ popsicles between glasses of prep.  Place a piece of sugar-free hard candy in mouth between glasses.  If you become nauseated, try consuming smaller amounts, or stretch out the time between glasses. Stop for 30-60 minutes, then slowly start back drinking.        OTHER INSTRUCTIONS  You will need a responsible adult at least 68 years of age to accompany you and drive you home. This person must remain in the waiting room during your procedure. The hospital will cancel your procedure if you do not have a responsible adult with  you.   1. Wear loose fitting clothing that is easily removed. 2. Leave jewelry and other valuables at home.  3. Remove all body piercing jewelry and leave at home. 4. Total time from sign-in until discharge is approximately 2-3 hours. 5. You should go home directly after your procedure and rest. You can resume normal activities the day after your procedure. 6. The day of your procedure you should not:  Drive  Make legal decisions  Operate machinery  Drink alcohol  Return to work   You may call the office (Dept: [phone]) before 5:00pm, or page the doctor on call [phone]) after 5:00pm, for further instructions, if necessary.   Insurance Information YOU WILL NEED TO CHECK WITH YOUR INSURANCE COMPANY FOR THE BENEFITS OF COVERAGE YOU HAVE FOR THIS PROCEDURE.  UNFORTUNATELY, NOT ALL INSURANCE COMPANIES HAVE BENEFITS TO COVER ALL OR PART OF THESE TYPES OF PROCEDURES.  IT IS YOUR RESPONSIBILITY TO CHECK YOUR BENEFITS, HOWEVER, WE WILL BE GLAD TO ASSIST YOU WITH ANY CODES YOUR INSURANCE COMPANY MAY NEED.    PLEASE NOTE THAT MOST INSURANCE COMPANIES WILL NOT COVER A SCREENING COLONOSCOPY FOR PEOPLE UNDER THE AGE OF 50  IF YOU HAVE BCBS INSURANCE, YOU MAY HAVE BENEFITS FOR A SCREENING COLONOSCOPY BUT IF POLYPS ARE FOUND THE DIAGNOSIS WILL CHANGE AND THEN YOU MAY HAVE A DEDUCTIBLE THAT WILL NEED TO BE MET. SO PLEASE MAKE SURE YOU CHECK YOUR BENEFITS FOR A SCREENING COLONOSCOPY AS WELL AS A DIAGNOSTIC COLONOSCOPY.

## 2018-01-13 NOTE — Progress Notes (Signed)
Gastroenterology Pre-Procedure Review  Request Date:01/13/18 Requesting Physician: 3 year recall  ( last tcs 02/08/15 RMR- tubular adenoma)  PATIENT REVIEW QUESTIONS: The patient responded to the following health history questions as indicated:    1. Diabetes Melitis: no 2. Joint replacements in the past 12 months: no 3. Major health problems in the past 3 months: no 4. Has an artificial valve or MVP: no 5. Has a defibrillator: no 6. Has been advised in past to take antibiotics in advance of a procedure like teeth cleaning: no 7. Family history of colon cancer: yes (maternal grandfather)  73. Alcohol Use: no 9. History of sleep apnea: no  10. History of coronary artery or other vascular stents placed within the last 12 months: no 11. History of any prior anesthesia complications: no    MEDICATIONS & ALLERGIES:    Patient reports the following regarding taking any blood thinners:   Plavix? no Aspirin? no Coumadin? no Brilinta? no Xarelto? no Eliquis? no Pradaxa? no Savaysa? no Effient? no  Patient confirms/reports the following medications:  Current Outpatient Medications  Medication Sig Dispense Refill  . atorvastatin (LIPITOR) 40 MG tablet Take 20 mg by mouth daily.     . Coenzyme Q10 (CO Q10 PO) Take 1 tablet by mouth at bedtime.     Marland Kitchen doxycycline (PERIOSTAT) 20 MG tablet     . Multiple Vitamin (MULTIVITAMIN WITH MINERALS) TABS tablet Take 1 tablet by mouth every morning.     . sodium bicarbonate 650 MG tablet Take 650 mg by mouth 3 (three) times daily.     . tamsulosin (FLOMAX) 0.4 MG CAPS capsule Take 1 capsule by mouth daily.    . cholecalciferol (VITAMIN D) 1000 UNITS tablet Take 1,000 Units by mouth every morning.      No current facility-administered medications for this visit.     Patient confirms/reports the following allergies:  No Known Allergies  No orders of the defined types were placed in this encounter.   AUTHORIZATION INFORMATION Primary Insurance:  [employer] medicare  ID #: [id number] Pre-Cert / Josem Kaufmann required: no    SCHEDULE INFORMATION: Procedure has been scheduled as follows:  Date: 02/26/18, Time: 1:00 Location: [facility] Dr.Rourk  This Gastroenterology Pre-Precedure Review Form is being routed to the following provider(s): Neil Crouch PA-C

## 2018-01-14 NOTE — Progress Notes (Signed)
OK to schedule

## 2018-01-22 DIAGNOSIS — E785 Hyperlipidemia, unspecified: Secondary | ICD-10-CM | POA: Diagnosis not present

## 2018-01-22 DIAGNOSIS — Z125 Encounter for screening for malignant neoplasm of prostate: Secondary | ICD-10-CM | POA: Diagnosis not present

## 2018-01-22 DIAGNOSIS — I10 Essential (primary) hypertension: Secondary | ICD-10-CM | POA: Diagnosis not present

## 2018-01-24 DIAGNOSIS — E782 Mixed hyperlipidemia: Secondary | ICD-10-CM | POA: Diagnosis not present

## 2018-01-24 DIAGNOSIS — N400 Enlarged prostate without lower urinary tract symptoms: Secondary | ICD-10-CM | POA: Diagnosis not present

## 2018-01-24 DIAGNOSIS — C641 Malignant neoplasm of right kidney, except renal pelvis: Secondary | ICD-10-CM | POA: Diagnosis not present

## 2018-01-24 DIAGNOSIS — C9110 Chronic lymphocytic leukemia of B-cell type not having achieved remission: Secondary | ICD-10-CM | POA: Diagnosis not present

## 2018-02-13 DIAGNOSIS — N183 Chronic kidney disease, stage 3 (moderate): Secondary | ICD-10-CM | POA: Diagnosis not present

## 2018-02-13 DIAGNOSIS — I10 Essential (primary) hypertension: Secondary | ICD-10-CM | POA: Diagnosis not present

## 2018-02-13 DIAGNOSIS — E559 Vitamin D deficiency, unspecified: Secondary | ICD-10-CM | POA: Diagnosis not present

## 2018-02-13 DIAGNOSIS — R809 Proteinuria, unspecified: Secondary | ICD-10-CM | POA: Diagnosis not present

## 2018-02-13 DIAGNOSIS — D509 Iron deficiency anemia, unspecified: Secondary | ICD-10-CM | POA: Diagnosis not present

## 2018-02-18 DIAGNOSIS — E872 Acidosis: Secondary | ICD-10-CM | POA: Diagnosis not present

## 2018-02-18 DIAGNOSIS — R809 Proteinuria, unspecified: Secondary | ICD-10-CM | POA: Diagnosis not present

## 2018-02-18 DIAGNOSIS — N184 Chronic kidney disease, stage 4 (severe): Secondary | ICD-10-CM | POA: Diagnosis not present

## 2018-02-18 DIAGNOSIS — I10 Essential (primary) hypertension: Secondary | ICD-10-CM | POA: Diagnosis not present

## 2018-02-24 DIAGNOSIS — Z125 Encounter for screening for malignant neoplasm of prostate: Secondary | ICD-10-CM | POA: Diagnosis not present

## 2018-02-26 ENCOUNTER — Ambulatory Visit (HOSPITAL_COMMUNITY)
Admission: RE | Admit: 2018-02-26 | Discharge: 2018-02-26 | Disposition: A | Discharge status: Home / Self Care | Payer: Medicare Other | Source: Ambulatory Visit | Attending: Internal Medicine | Admitting: Internal Medicine

## 2018-02-26 ENCOUNTER — Other Ambulatory Visit: Payer: Self-pay

## 2018-02-26 ENCOUNTER — Encounter (HOSPITAL_COMMUNITY): Payer: Self-pay

## 2018-02-26 ENCOUNTER — Encounter (HOSPITAL_COMMUNITY)
Admission: RE | Disposition: A | Discharge status: Home / Self Care | Payer: Self-pay | Source: Ambulatory Visit | Attending: Internal Medicine

## 2018-02-26 DIAGNOSIS — I341 Nonrheumatic mitral (valve) prolapse: Secondary | ICD-10-CM | POA: Diagnosis not present

## 2018-02-26 DIAGNOSIS — Z86010 Personal history of colonic polyps: Secondary | ICD-10-CM | POA: Diagnosis not present

## 2018-02-26 DIAGNOSIS — E785 Hyperlipidemia, unspecified: Secondary | ICD-10-CM | POA: Insufficient documentation

## 2018-02-26 DIAGNOSIS — D122 Benign neoplasm of ascending colon: Secondary | ICD-10-CM | POA: Diagnosis not present

## 2018-02-26 DIAGNOSIS — Z8249 Family history of ischemic heart disease and other diseases of the circulatory system: Secondary | ICD-10-CM | POA: Diagnosis not present

## 2018-02-26 DIAGNOSIS — Z1211 Encounter for screening for malignant neoplasm of colon: Secondary | ICD-10-CM | POA: Insufficient documentation

## 2018-02-26 DIAGNOSIS — F17210 Nicotine dependence, cigarettes, uncomplicated: Secondary | ICD-10-CM | POA: Diagnosis not present

## 2018-02-26 DIAGNOSIS — Z79899 Other long term (current) drug therapy: Secondary | ICD-10-CM | POA: Diagnosis not present

## 2018-02-26 DIAGNOSIS — Z800 Family history of malignant neoplasm of digestive organs: Secondary | ICD-10-CM | POA: Insufficient documentation

## 2018-02-26 DIAGNOSIS — K5730 Diverticulosis of large intestine without perforation or abscess without bleeding: Secondary | ICD-10-CM | POA: Diagnosis not present

## 2018-02-26 HISTORY — PX: COLONOSCOPY: SHX5424

## 2018-02-26 HISTORY — PX: POLYPECTOMY: SHX5525

## 2018-02-26 SURGERY — COLONOSCOPY
Anesthesia: Moderate Sedation

## 2018-02-26 MED ORDER — ONDANSETRON HCL 4 MG/2ML IJ SOLN
INTRAMUSCULAR | Status: DC | PRN
  Administered 2018-02-26: 4 mg via INTRAVENOUS

## 2018-02-26 MED ORDER — MEPERIDINE HCL 100 MG/ML IJ SOLN
INTRAMUSCULAR | Status: AC
  Filled 2018-02-26: qty 2

## 2018-02-26 MED ORDER — MEPERIDINE HCL 100 MG/ML IJ SOLN
INTRAMUSCULAR | Status: DC | PRN
  Administered 2018-02-26 (×2): 50 mg via INTRAVENOUS

## 2018-02-26 MED ORDER — SODIUM CHLORIDE 0.9 % IV SOLN
INTRAVENOUS | Status: DC
  Administered 2018-02-26: 13:00:00 via INTRAVENOUS

## 2018-02-26 MED ORDER — MIDAZOLAM HCL 5 MG/5ML IJ SOLN
INTRAMUSCULAR | Status: AC
  Filled 2018-02-26: qty 10

## 2018-02-26 MED ORDER — ONDANSETRON HCL 4 MG/2ML IJ SOLN
INTRAMUSCULAR | Status: AC
  Filled 2018-02-26: qty 2

## 2018-02-26 MED ORDER — MIDAZOLAM HCL 5 MG/5ML IJ SOLN
INTRAMUSCULAR | Status: DC | PRN
  Administered 2018-02-26 (×2): 2 mg via INTRAVENOUS
  Administered 2018-02-26: 1 mg via INTRAVENOUS

## 2018-02-26 NOTE — Op Note (Signed)
[facility] Patient Name: Keith Rivera Procedure Date: 02/26/2018 12:34 PM MRN: [id number] Date of Birth: May 30, 1951 Attending MD: Norvel Richards , MD CSN: [id number] Age: 67 Admit Type: Outpatient Procedure:                Colonoscopy Indications:              High risk colon cancer surveillance: Personal                            history of colonic polyps Providers:                Norvel Richards, MD, Lurline Del, RN, Randa Spike, Technician Referring MD:              Medicines:                Midazolam 5 mg IV, Meperidine 100 mg IV,                            Ondansetron 4 mg IV Complications:            No immediate complications. Estimated Blood Loss:     Estimated blood loss: none. Procedure:                Pre-Anesthesia Assessment:                           - Prior to the procedure, a History and Physical                            was performed, and patient medications and                            allergies were reviewed. The patient's tolerance of                            previous anesthesia was also reviewed. The risks                            and benefits of the procedure and the sedation                            options and risks were discussed with the patient.                            All questions were answered, and informed consent                            was obtained. Prior Anticoagulants: The patient has                            taken no previous anticoagulant or antiplatelet  agents. ASA Grade Assessment: II - A patient with                            mild systemic disease. After reviewing the risks                            and benefits, the patient was deemed in                            satisfactory condition to undergo the procedure.                           After obtaining informed consent, the colonoscope                            was passed under direct vision.  Throughout the                            procedure, the patient's blood pressure, pulse, and                            oxygen saturations were monitored continuously. The                            [id number]) scope was introduced through                            the anus and advanced to the the cecum, identified                            by appendiceal orifice and ileocecal valve. The                            ileocecal valve, appendiceal orifice, and rectum                            were photographed. The entire colon was well                            visualized. The patient tolerated the procedure                            well. Scope In: 1:03:57 PM Scope Out: 1:17:31 PM Scope Withdrawal Time: 0 hours 9 minutes 13 seconds  Total Procedure Duration: 0 hours 13 minutes 34 seconds  Findings:      The perianal and digital rectal examinations were normal.      A 12 mm polyp was found in the ascending colon. The polyp was       pedunculated. The polyp was removed with a hot snare. Resection and       retrieval were complete. Estimated blood loss: none.      Scattered diverticula were found in the sigmoid colon and descending       colon.      The exam was otherwise without abnormality on direct  and retroflexion       views. Impression:               - One 12 mm polyp in the ascending colon, removed                            with a hot snare. Resected and retrieved.                           - Diverticulosis in the sigmoid colon and in the                            descending colon.                           - The examination was otherwise normal on direct                            and retroflexion views. Moderate Sedation:      Moderate (conscious) sedation was administered by the endoscopy nurse       and supervised by the endoscopist. The following parameters were       monitored: oxygen saturation, heart rate, blood pressure, respiratory       rate, EKG, adequacy  of pulmonary ventilation, and response to care.       Total physician intraservice time was 16 minutes. Recommendation:           - Patient has a contact number available for                            emergencies. The signs and symptoms of potential                            delayed complications were discussed with the                            patient. Return to normal activities tomorrow.                            Written discharge instructions were provided to the                            patient.                           - Resume previous diet.                           - Continue present medications.                           - Repeat colonoscopy for surveillance based on                            pathology results.                           -  Return to GI office (date not yet determined). Procedure Code(s):        --- Professional ---                           [phone], Colonoscopy, flexible; with removal of                            tumor(s), polyp(s), or other lesion(s) by snare                            technique                           G0500, Moderate sedation services provided by the                            same physician or other qualified health care                            professional performing a gastrointestinal                            endoscopic service that sedation supports,                            requiring the presence of an independent trained                            observer to assist in the monitoring of the                            patient's level of consciousness and physiological                            status; initial 15 minutes of intra-service time;                            patient age 53 years or older (additional time may                            be reported with [phone], as appropriate) Diagnosis Code(s):        --- Professional ---                           Z86.010, Personal history of colonic polyps                            D12.2, Benign neoplasm of ascending colon                           K57.30, Diverticulosis of large intestine without                            perforation or abscess without bleeding CPT copyright 2017 American Medical Association. All rights reserved. The  codes documented in this report are preliminary and upon coder review may  be revised to meet current compliance requirements. Cristopher Estimable. Jovana Rembold, MD Norvel Richards, MD 02/26/2018 1:24:09 PM This report has been signed electronically. Number of Addenda: 0

## 2018-02-26 NOTE — H&P (Signed)
@LOGO @   Primary Care Physician:  Celene Squibb, MD Primary Gastroenterologist:  Dr. Gala Romney  Pre-Procedure History & Physical: HPI:  Keith Rivera is a 67 y.o. male here for surveillance colonoscopy. Multiple polyps removed 2016. One greater than 1 cm.; Here for surveillance colonoscopy.  Past Medical History:  Diagnosis Date  . Anemia   . BPH (benign prostatic hyperplasia)   . Chronic kidney disease   . History of hydronephrosis   . Hyperlipidemia   . MVP (mitral valve prolapse)    Mild mitral regurgitation 2014    Past Surgical History:  Procedure Laterality Date  . CATARACT EXTRACTION W/PHACO Right 11/28/2015   Procedure: CATARACT EXTRACTION PHACO AND INTRAOCULAR LENS PLACEMENT (IOC);  Surgeon: Williams Che, MD;  Location: [facility];  Service: Ophthalmology;  Laterality: Right;  CDE: 7.14  . COLONOSCOPY N/A 02/08/2015   Procedure: COLONOSCOPY;  Surgeon: Daneil Dolin, MD;  Location: [facility];  Service: Endoscopy;  Laterality: N/A;  200  . CYSTOSCOPY WITH RETROGRADE PYELOGRAM, URETEROSCOPY AND STENT PLACEMENT Bilateral 12/03/2014   Procedure: CYSTOSCOPY WITH BILATERAL RETROGRADE PYELOGRAM, BILATERAL  URETEROSCOPY ;  Surgeon: Claybon Jabs, MD;  Location: [facility];  Service: Urology;  Laterality: Bilateral;  . INGUINAL HERNIA REPAIR Left   . LAPAROSCOPIC NEPHRECTOMY Right 02/18/2015   Procedure: LAPAROSCOPIC RIGHT RADICAL NEPHRECTOMY;  Surgeon: Ardis Hughs, MD;  Location: [facility];  Service: Urology;  Laterality: Right;  . NEPHROSTOMY  2006   DUE TO ENLARGED PROSTATE  . TONSILLECTOMY    . TRANSURETHRAL RESECTION OF PROSTATE    . VASECTOMY      Prior to Admission medications   Medication Sig Start Date End Date Taking? Authorizing Provider  acetaminophen (TYLENOL) 500 MG tablet Take 1,000 mg by mouth every 6 (six) hours as needed for moderate pain or headache.   Yes [provider]  atorvastatin (LIPITOR) 20 MG tablet Take 20 mg by mouth daily.  03/15/16  Yes  [provider]  cholecalciferol (VITAMIN D) 1000 UNITS tablet Take 1,000 Units by mouth every morning.    Yes [provider]  Coenzyme Q10 (CO Q10 PO) Take 1 tablet by mouth at bedtime.    Yes [provider]  Cyanocobalamin (VITAMIN B-12 PO) Take 1 tablet by mouth daily.   Yes [provider]  doxycycline (PERIOSTAT) 20 MG tablet Take 20 mg by mouth daily.  08/14/16  Yes [provider]  Multiple Vitamin (MULTIVITAMIN WITH MINERALS) TABS tablet Take 1 tablet by mouth every morning.    Yes [provider]  Naphazoline HCl (CLEAR EYES OP) Place 2 drops into both eyes daily as needed (for dry eyes).   Yes [provider]  polyethylene glycol-electrolytes (TRILYTE) 420 g solution Take 4,000 mLs by mouth as directed. 01/13/18  Yes Mahala Menghini, PA-C  sodium bicarbonate 650 MG tablet Take 650 mg by mouth 4 (four) times daily.    Yes [provider]    Allergies as of 01/13/2018  . (No Known Allergies)    Family History  Problem Relation Age of Onset  . Heart attack Father        Died age 30  . Colon cancer Maternal Grandfather        In his 49s    Social History   Socioeconomic History  . Marital status: Married    Spouse name: Not on file  . Number of children: 0  . Years of education: Not on file  . Highest  education level: Not on file  Occupational History  . Not on file  Social Needs  . Financial resource strain: Not hard at all  . Food insecurity:    Worry: Never true    Inability: Never true  . Transportation needs:    Medical: No    Non-medical: No  Tobacco Use  . Smoking status: Current Every Day Smoker    Packs/day: 1.00    Years: 40.00    Pack years: 40.00    Types: Cigarettes    Start date: 06/29/1971  . Smokeless tobacco: Never Used  Substance and Sexual Activity  . Alcohol use: Yes    Alcohol/week: 0.0 oz    Comment: Rare  . Drug use: No  . Sexual activity: Never    Birth  control/protection: None  Lifestyle  . Physical activity:    Days per week: 7 days    Minutes per session: 30 min  . Stress: Not at all  Relationships  . Social connections:    Talks on phone: Never    Gets together: Never    Attends religious service: Never    Active member of club or organization: Yes    Attends meetings of clubs or organizations: More than 4 times per year    Relationship status: Married  . Intimate partner violence:    Fear of current or ex partner: No    Emotionally abused: No    Physically abused: No    Forced sexual activity: No  Other Topics Concern  . Not on file  Social History Narrative  . Not on file    Review of Systems: See HPI, otherwise negative ROS  Physical Exam: BP 101/70   Pulse 96   Temp 98.1 F (36.7 C) (Oral)   Resp 13   Ht 5\' 11"  (1.803 m)   Wt 170 lb (77.1 kg)   SpO2 96%   BMI 23.71 kg/m  General:   Alert,  Well-developed, well-nourished, pleasant and cooperative in NAD Neck:  Supple; no masses or thyromegaly. No significant cervical adenopathy. Lungs:  Clear throughout to auscultation.   No wheezes, crackles, or rhonchi. No acute distress. Heart:  Regular rate and rhythm; no murmurs, clicks, rubs,  or gallops. Abdomen: Non-distended, normal bowel sounds.  Soft and nontender without appreciable mass or hepatosplenomegaly.  Pulses:  Normal pulses noted. Extremities:  Without clubbing or edema.  Impression:  67 year old, history colonic polyps; here for surveillance colonoscopy.  Recommendations:  I have offered the patient a surveillance colonoscopy today.  The risks, benefits, limitations, alternatives and imponderables have been reviewed with the patient. Questions have been answered. All parties are agreeable.     Notice: This dictation was prepared with Dragon dictation along with smaller phrase technology. Any transcriptional errors that result from this process are unintentional and may not be corrected upon  review.

## 2018-02-26 NOTE — Discharge Instructions (Signed)
Colonoscopy Discharge Instructions  Read the instructions outlined below and refer to this sheet in the next few weeks. These discharge instructions provide you with general information on caring for yourself after you leave the hospital. Your doctor may also give you specific instructions. While your treatment has been planned according to the most current medical practices available, unavoidable complications occasionally occur. If you have any problems or questions after discharge, call Dr. Gala Romney at [phone]. ACTIVITY  You may resume your regular activity, but move at a slower pace for the next 24 hours.   Take frequent rest periods for the next 24 hours.   Walking will help get rid of the air and reduce the bloated feeling in your belly (abdomen).   No driving for 24 hours (because of the medicine (anesthesia) used during the test).    Do not sign any important legal documents or operate any machinery for 24 hours (because of the anesthesia used during the test).  NUTRITION  Drink plenty of fluids.   You may resume your normal diet as instructed by your doctor.   Begin with a light meal and progress to your normal diet. Heavy or fried foods are harder to digest and may make you feel sick to your stomach (nauseated).   Avoid alcoholic beverages for 24 hours or as instructed.  MEDICATIONS  You may resume your normal medications unless your doctor tells you otherwise.  WHAT YOU CAN EXPECT TODAY  Some feelings of bloating in the abdomen.   Passage of more gas than usual.   Spotting of blood in your stool or on the toilet paper.  IF YOU HAD POLYPS REMOVED DURING THE COLONOSCOPY:  No aspirin products for 7 days or as instructed.   No alcohol for 7 days or as instructed.   Eat a soft diet for the next 24 hours.  FINDING OUT THE RESULTS OF YOUR TEST Not all test results are available during your visit. If your test results are not back during the visit, make an appointment  with your caregiver to find out the results. Do not assume everything is normal if you have not heard from your caregiver or the medical facility. It is important for you to follow up on all of your test results.  SEEK IMMEDIATE MEDICAL ATTENTION IF:  You have more than a spotting of blood in your stool.   Your belly is swollen (abdominal distention).   You are nauseated or vomiting.   You have a temperature over 101.   You have abdominal pain or discomfort that is severe or gets worse throughout the day.    Diverticulosis and colon polyp information provided  Further recommendations to follow pending review of pathology report   Colon Polyps Polyps are tissue growths inside the body. Polyps can grow in many places, including the large intestine (colon). A polyp may be a round bump or a mushroom-shaped growth. You could have one polyp or several. Most colon polyps are noncancerous (benign). However, some colon polyps can become cancerous over time. What are the causes? The exact cause of colon polyps is not known. What increases the risk? This condition is more likely to develop in people who:  Have a family history of colon cancer or colon polyps.  Are older than 28 or older than 45 if they are African American.  Have inflammatory bowel disease, such as ulcerative colitis or Crohn disease.  Are overweight.  Smoke cigarettes.  Do not get enough exercise.  Drink too  much alcohol.  Eat a diet that is: ? High in fat and red meat. ? Low in fiber.  Had childhood cancer that was treated with abdominal radiation.  What are the signs or symptoms? Most polyps do not cause symptoms. If you have symptoms, they may include:  Blood coming from your rectum when having a bowel movement.  Blood in your stool.The stool may look dark red or black.  A change in bowel habits, such as constipation or diarrhea.  How is this diagnosed? This condition is diagnosed with a  colonoscopy. This is a procedure that uses a lighted, flexible scope to look at the inside of your colon. How is this treated? Treatment for this condition involves removing any polyps that are found. Those polyps will then be tested for cancer. If cancer is found, your health care provider will talk to you about options for colon cancer treatment. Follow these instructions at home: Diet  Eat plenty of fiber, such as fruits, vegetables, and whole grains.  Eat foods that are high in calcium and vitamin D, such as milk, cheese, yogurt, eggs, liver, fish, and broccoli.  Limit foods high in fat, red meats, and processed meats, such as hot dogs, sausage, bacon, and lunch meats.  Maintain a healthy weight, or lose weight if recommended by your health care provider. General instructions  Do not smoke cigarettes.  Do not drink alcohol excessively.  Keep all follow-up visits as told by your health care provider. This is important. This includes keeping regularly scheduled colonoscopies. Talk to your health care provider about when you need a colonoscopy.  Exercise every day or as told by your health care provider. Contact a health care provider if:  You have new or worsening bleeding during a bowel movement.  You have new or increased blood in your stool.  You have a change in bowel habits.  You unexpectedly lose weight. This information is not intended to replace advice given to you by your health care provider. Make sure you discuss any questions you have with your health care provider. Document Released: 07/11/2004 Document Revised: 03/22/2016 Document Reviewed: 09/05/2015 Elsevier Interactive Patient Education  [employer].  Diverticulosis Diverticulosis is a condition that develops when small pouches (diverticula) form in the wall of the large intestine (colon). The colon is where water is absorbed and stool is formed. The pouches form when the inside layer of the colon pushes  through weak spots in the outer layers of the colon. You may have a few pouches or many of them. What are the causes? The cause of this condition is not known. What increases the risk? The following factors may make you more likely to develop this condition:  Being older than age 35. Your risk for this condition increases with age. Diverticulosis is rare among people younger than age 75. By age 78, many people have it.  Eating a low-fiber diet.  Having frequent constipation.  Being overweight.  Not getting enough exercise.  Smoking.  Taking over-the-counter pain medicines, like aspirin and ibuprofen.  Having a family history of diverticulosis.  What are the signs or symptoms? In most people, there are no symptoms of this condition. If you do have symptoms, they may include:  Bloating.  Cramps in the abdomen.  Constipation or diarrhea.  Pain in the lower left side of the abdomen.  How is this diagnosed? This condition is most often diagnosed during an exam for other colon problems. Because diverticulosis usually has no symptoms,  it often cannot be diagnosed independently. This condition may be diagnosed by:  Using a flexible scope to examine the colon (colonoscopy).  Taking an X-ray of the colon after dye has been put into the colon (barium enema).  Doing a CT scan.  How is this treated? You may not need treatment for this condition if you have never developed an infection related to diverticulosis. If you have had an infection before, treatment may include:  Eating a high-fiber diet. This may include eating more fruits, vegetables, and grains.  Taking a fiber supplement.  Taking a live bacteria supplement (probiotic).  Taking medicine to relax your colon.  Taking antibiotic medicines.  Follow these instructions at home:  Drink 6-8 glasses of water or more each day to prevent constipation.  Try not to strain when you have a bowel movement.  If you have had  an infection before: ? Eat more fiber as directed by your health care provider or your diet and nutrition specialist (dietitian). ? Take a fiber supplement or probiotic, if your health care provider approves.  Take over-the-counter and prescription medicines only as told by your health care provider.  If you were prescribed an antibiotic, take it as told by your health care provider. Do not stop taking the antibiotic even if you start to feel better.  Keep all follow-up visits as told by your health care provider. This is important. Contact a health care provider if:  You have pain in your abdomen.  You have bloating.  You have cramps.  You have not had a bowel movement in 3 days. Get help right away if:  Your pain gets worse.  Your bloating becomes very bad.  You have a fever or chills, and your symptoms suddenly get worse.  You vomit.  You have bowel movements that are bloody or black.  You have bleeding from your rectum. Summary  Diverticulosis is a condition that develops when small pouches (diverticula) form in the wall of the large intestine (colon).  You may have a few pouches or many of them.  This condition is most often diagnosed during an exam for other colon problems.  If you have had an infection related to diverticulosis, treatment may include increasing the fiber in your diet, taking supplements, or taking medicines. This information is not intended to replace advice given to you by your health care provider. Make sure you discuss any questions you have with your health care provider. Document Released: 07/12/2004 Document Revised: 09/03/2016 Document Reviewed: 09/03/2016 Elsevier Interactive Patient Education  2017 [employer].

## 2018-03-03 ENCOUNTER — Encounter (HOSPITAL_COMMUNITY): Payer: Self-pay | Admitting: Internal Medicine

## 2018-03-04 DIAGNOSIS — D3002 Benign neoplasm of left kidney: Secondary | ICD-10-CM | POA: Diagnosis not present

## 2018-03-04 DIAGNOSIS — C641 Malignant neoplasm of right kidney, except renal pelvis: Secondary | ICD-10-CM | POA: Diagnosis not present

## 2018-03-04 DIAGNOSIS — R972 Elevated prostate specific antigen [PSA]: Secondary | ICD-10-CM | POA: Diagnosis not present

## 2018-03-04 DIAGNOSIS — N401 Enlarged prostate with lower urinary tract symptoms: Secondary | ICD-10-CM | POA: Diagnosis not present

## 2018-03-04 DIAGNOSIS — R350 Frequency of micturition: Secondary | ICD-10-CM | POA: Diagnosis not present

## 2018-03-06 ENCOUNTER — Encounter: Payer: Self-pay | Admitting: Internal Medicine

## 2018-03-24 ENCOUNTER — Other Ambulatory Visit (HOSPITAL_COMMUNITY): Payer: Self-pay | Admitting: *Deleted

## 2018-03-24 DIAGNOSIS — C9110 Chronic lymphocytic leukemia of B-cell type not having achieved remission: Secondary | ICD-10-CM

## 2018-03-26 ENCOUNTER — Inpatient Hospital Stay (HOSPITAL_COMMUNITY): Payer: Medicare Other | Attending: Hematology

## 2018-03-26 DIAGNOSIS — C9110 Chronic lymphocytic leukemia of B-cell type not having achieved remission: Secondary | ICD-10-CM | POA: Insufficient documentation

## 2018-03-26 LAB — COMPREHENSIVE METABOLIC PANEL
ALT: 19 U/L (ref 17–63)
ANION GAP: 10 (ref 5–15)
AST: 23 U/L (ref 15–41)
Albumin: 3.9 g/dL (ref 3.5–5.0)
Alkaline Phosphatase: 66 U/L (ref 38–126)
BILIRUBIN TOTAL: 0.9 mg/dL (ref 0.3–1.2)
BUN: 27 mg/dL — ABNORMAL HIGH (ref 6–20)
CALCIUM: 9.5 mg/dL (ref 8.9–10.3)
CO2: 24 mmol/L (ref 22–32)
Chloride: 107 mmol/L (ref 101–111)
Creatinine, Ser: 3.16 mg/dL — ABNORMAL HIGH (ref 0.61–1.24)
GFR, EST AFRICAN AMERICAN: 22 mL/min — AB (ref >60)
GFR, EST NON AFRICAN AMERICAN: 19 mL/min — AB (ref >60)
GLUCOSE: 120 mg/dL — AB (ref 65–99)
Potassium: 4 mmol/L (ref 3.5–5.1)
Sodium: 141 mmol/L (ref 135–145)
TOTAL PROTEIN: 6.7 g/dL (ref 6.5–8.1)

## 2018-03-26 LAB — CBC WITH DIFFERENTIAL/PLATELET
BAND NEUTROPHILS: 0 %
BLASTS: 0 %
Basophils Absolute: 0 10*3/uL (ref 0.0–0.1)
Basophils Relative: 0 %
EOS ABS: 0.3 10*3/uL (ref 0.0–0.7)
Eosinophils Relative: 1 %
HEMATOCRIT: 46.6 % (ref 39.0–52.0)
HEMOGLOBIN: 15.8 g/dL (ref 13.0–17.0)
LYMPHS PCT: 60 %
Lymphs Abs: 15.6 10*3/uL — ABNORMAL HIGH (ref 0.7–4.0)
MCH: 31.8 pg (ref 26.0–34.0)
MCHC: 33.9 g/dL (ref 30.0–36.0)
MCV: 93.8 fL (ref 78.0–100.0)
MONOS PCT: 14 %
Metamyelocytes Relative: 0 %
Monocytes Absolute: 3.6 10*3/uL — ABNORMAL HIGH (ref 0.1–1.0)
Myelocytes: 0 %
NEUTROS ABS: 6.5 10*3/uL (ref 1.7–7.7)
NEUTROS PCT: 25 %
NRBC: 0 /100{WBCs}
OTHER: 0 %
PROMYELOCYTES RELATIVE: 0 %
Platelets: 137 10*3/uL — ABNORMAL LOW (ref 150–400)
RBC: 4.97 MIL/uL (ref 4.22–5.81)
RDW: 14.6 % (ref 11.5–15.5)
WBC: 26 10*3/uL — ABNORMAL HIGH (ref 4.0–10.5)

## 2018-03-27 ENCOUNTER — Other Ambulatory Visit (HOSPITAL_COMMUNITY): Payer: Medicare Other

## 2018-03-27 ENCOUNTER — Encounter (HOSPITAL_COMMUNITY): Payer: Self-pay | Admitting: Hematology

## 2018-03-27 ENCOUNTER — Other Ambulatory Visit: Payer: Self-pay

## 2018-03-27 ENCOUNTER — Inpatient Hospital Stay (HOSPITAL_COMMUNITY): Payer: Medicare Other | Attending: Hematology | Admitting: Hematology

## 2018-03-27 VITALS — BP 100/84 | HR 73 | Temp 97.7°F | Resp 18 | Wt 170.0 lb

## 2018-03-27 DIAGNOSIS — Z800 Family history of malignant neoplasm of digestive organs: Secondary | ICD-10-CM | POA: Insufficient documentation

## 2018-03-27 DIAGNOSIS — F17210 Nicotine dependence, cigarettes, uncomplicated: Secondary | ICD-10-CM | POA: Insufficient documentation

## 2018-03-27 DIAGNOSIS — N400 Enlarged prostate without lower urinary tract symptoms: Secondary | ICD-10-CM | POA: Diagnosis not present

## 2018-03-27 DIAGNOSIS — E785 Hyperlipidemia, unspecified: Secondary | ICD-10-CM | POA: Diagnosis not present

## 2018-03-27 DIAGNOSIS — Z79899 Other long term (current) drug therapy: Secondary | ICD-10-CM | POA: Diagnosis not present

## 2018-03-27 DIAGNOSIS — N189 Chronic kidney disease, unspecified: Secondary | ICD-10-CM | POA: Diagnosis not present

## 2018-03-27 DIAGNOSIS — Z905 Acquired absence of kidney: Secondary | ICD-10-CM | POA: Diagnosis not present

## 2018-03-27 DIAGNOSIS — C9110 Chronic lymphocytic leukemia of B-cell type not having achieved remission: Secondary | ICD-10-CM | POA: Diagnosis not present

## 2018-03-27 NOTE — Assessment & Plan Note (Addendum)
1.  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  Jak 2 and BCR/ABL negative. - Does not have any fevers, night sweats or weight loss.  Mild tiredness is stable.  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  Denies any infections in the last 6 months. -White count is stable around 20 6K.  Hemoglobin is 15.8 and platelet count is 137. -Today's examination did not reveal any palpable adenopathy or splenomegaly.  We can safely monitor him at 6 monthly intervals.  2.  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  This was noted on review of chart after patient left.  At next visit, I will make sure that he is getting abdominal CT at least annually. - CKD with stable creatinine of 3.16.

## 2018-03-27 NOTE — Patient Instructions (Signed)
[facility] at [facility] Discharge Instructions  Today you saw Dr. K.   Thank you for choosing [facility] at [facility] to provide your oncology and hematology care.  To afford each patient quality time with our provider, please arrive at least 15 minutes before your scheduled appointment time.   If you have a lab appointment with the Cancer Center please come in thru the  Main Entrance and check in at the main information desk  You need to re-schedule your appointment should you arrive 10 or more minutes late.  We strive to give you quality time with our providers, and arriving late affects you and other patients whose appointments are after yours.  Also, if you no show three or more times for appointments you may be dismissed from the clinic at the providers discretion.     Again, thank you for choosing [facility].  Our hope is that these requests will decrease the amount of time that you wait before being seen by our physicians.       _____________________________________________________________  Should you have questions after your visit to [facility], please contact our office at [phone] between the hours of 8:30 a.m. and 4:30 p.m.  Voicemails left after 4:30 p.m. will not be returned until the following business day.  For prescription refill requests, have your pharmacy contact our office.       Resources For Cancer Patients and their Caregivers ? American Cancer Society: Can assist with transportation, wigs, general needs, runs Look Good Feel Better.        1-888-227-6333 ? Cancer Care: Provides financial assistance, online support groups, medication/co-pay assistance.  1-800-813-HOPE (4673) ? [facility] Assists [place] cancer patients and their families through emotional , educational and financial support.  336-427-4357 ? [place] DSS Where to apply for food  stamps, Medicaid and utility assistance. 336-342-1394 ? [facility]: Transportation to medical appointments. 336-347-2287 ? Social Security Administration: May apply for disability if have a Stage IV cancer. 336-342-7796 1-800-772-1213 ? [place] Aging, Disability and Transit Services: Assists with nutrition, care and transit needs. 336-349-2343  Cancer Center Support Programs:   > Cancer Support Group  2nd Tuesday of the month 1pm-2pm, Journey Room   > Creative Journey  3rd Tuesday of the month 1130am-1pm, Journey Room    

## 2018-03-27 NOTE — Progress Notes (Signed)
[place] [address], [place] [address]   CLINIC:  Medical Oncology/Hematology  PCP:  Celene Squibb, MD [place] [address] [phone]   REASON FOR VISIT:  Follow-up for CLL.  CURRENT THERAPY: Observation.   INTERVAL HISTORY:  Keith Rivera 67 y.o. male returns for follow-up of CLL.  He is continuing to work full-time job.  Denies any fevers, night sweats or weight loss.  He is also volunteering at the fire department.  He does yard work at home.  Denies any infections in the past 6 months.  Denies any ER visits or hospitalizations.  Appetite has been good.  No palpable lumps reported.    REVIEW OF SYSTEMS:  Review of Systems  Constitutional: Negative.   HENT:  Negative.   Respiratory: Negative.   Cardiovascular: Negative.   Gastrointestinal: Negative.   Genitourinary: Negative.    Musculoskeletal: Negative.   Skin: Negative.   Neurological: Negative.   Hematological: Negative.   Psychiatric/Behavioral: Negative.      PAST MEDICAL/SURGICAL HISTORY:  Past Medical History:  Diagnosis Date  . Anemia   . BPH (benign prostatic hyperplasia)   . Chronic kidney disease   . History of hydronephrosis   . Hyperlipidemia   . MVP (mitral valve prolapse)    Mild mitral regurgitation 2014   Past Surgical History:  Procedure Laterality Date  . CATARACT EXTRACTION W/PHACO Right 11/28/2015   Procedure: CATARACT EXTRACTION PHACO AND INTRAOCULAR LENS PLACEMENT (IOC);  Surgeon: Williams Che, MD;  Location: [facility];  Service: Ophthalmology;  Laterality: Right;  CDE: 7.14  . COLONOSCOPY N/A 02/08/2015   Procedure: COLONOSCOPY;  Surgeon: Daneil Dolin, MD;  Location: [facility];  Service: Endoscopy;  Laterality: N/A;  200  . COLONOSCOPY N/A 02/26/2018   Procedure: COLONOSCOPY;  Surgeon: Daneil Dolin, MD;  Location: [facility];  Service: Endoscopy;  Laterality: N/A;  1:00  . CYSTOSCOPY WITH RETROGRADE PYELOGRAM, URETEROSCOPY  AND STENT PLACEMENT Bilateral 12/03/2014   Procedure: CYSTOSCOPY WITH BILATERAL RETROGRADE PYELOGRAM, BILATERAL  URETEROSCOPY ;  Surgeon: Claybon Jabs, MD;  Location: [facility];  Service: Urology;  Laterality: Bilateral;  . INGUINAL HERNIA REPAIR Left   . LAPAROSCOPIC NEPHRECTOMY Right 02/18/2015   Procedure: LAPAROSCOPIC RIGHT RADICAL NEPHRECTOMY;  Surgeon: Ardis Hughs, MD;  Location: [facility];  Service: Urology;  Laterality: Right;  . NEPHROSTOMY  2006   DUE TO ENLARGED PROSTATE  . POLYPECTOMY  02/26/2018   Procedure: POLYPECTOMY;  Surgeon: Daneil Dolin, MD;  Location: [facility];  Service: Endoscopy;;  ascending colon  . TONSILLECTOMY    . TRANSURETHRAL RESECTION OF PROSTATE    . VASECTOMY       SOCIAL HISTORY:  Social History   Socioeconomic History  . Marital status: Married    Spouse name: Not on file  . Number of children: 0  . Years of education: Not on file  . Highest education level: Not on file  Occupational History  . Not on file  Social Needs  . Financial resource strain: Not hard at all  . Food insecurity:    Worry: Never true    Inability: Never true  . Transportation needs:    Medical: No    Non-medical: No  Tobacco Use  . Smoking status: Current Every Day Smoker    Packs/day: 1.00    Years: 40.00    Pack years: 40.00    Types: Cigarettes    Start date:  06/29/1971  . Smokeless tobacco: Never Used  Substance and Sexual Activity  . Alcohol use: Yes    Alcohol/week: 0.0 oz    Comment: Rare  . Drug use: No  . Sexual activity: Never    Birth control/protection: None  Lifestyle  . Physical activity:    Days per week: 7 days    Minutes per session: 30 min  . Stress: Not at all  Relationships  . Social connections:    Talks on phone: Never    Gets together: Never    Attends religious service: Never    Active member of club or organization: Yes    Attends meetings of clubs or organizations: More than 4 times per year    Relationship status:  Married  . Intimate partner violence:    Fear of current or ex partner: No    Emotionally abused: No    Physically abused: No    Forced sexual activity: No  Other Topics Concern  . Not on file  Social History Narrative  . Not on file    FAMILY HISTORY:  Family History  Problem Relation Age of Onset  . Heart attack Father        Died age 56  . Colon cancer Maternal Grandfather        In his 28s    CURRENT MEDICATIONS:  Outpatient Encounter Medications as of 03/27/2018  Medication Sig  . acetaminophen (TYLENOL) 500 MG tablet Take 1,000 mg by mouth every 6 (six) hours as needed for moderate pain or headache.  Marland Kitchen atorvastatin (LIPITOR) 20 MG tablet Take 20 mg by mouth daily.   . cholecalciferol (VITAMIN D) 1000 UNITS tablet Take 1,000 Units by mouth every morning.   . Coenzyme Q10 (CO Q10 PO) Take 1 tablet by mouth at bedtime.   . Cyanocobalamin (VITAMIN B-12 PO) Take 1 tablet by mouth daily.  Marland Kitchen doxycycline (PERIOSTAT) 20 MG tablet Take 20 mg by mouth daily.   . Multiple Vitamin (MULTIVITAMIN WITH MINERALS) TABS tablet Take 1 tablet by mouth every morning.   . Naphazoline HCl (CLEAR EYES OP) Place 2 drops into both eyes daily as needed (for dry eyes).  . polyethylene glycol-electrolytes (TRILYTE) 420 g solution Take 4,000 mLs by mouth as directed.  . sodium bicarbonate 650 MG tablet Take 650 mg by mouth 4 (four) times daily.    No facility-administered encounter medications on file as of 03/27/2018.     ALLERGIES:  No Known Allergies   PHYSICAL EXAM:  ECOG Performance status: 0  Vitals:   03/27/18 0938  BP: 100/84  Pulse: 73  Resp: 18  Temp: 97.7 F (36.5 C)  SpO2: 97%   Filed Weights   03/27/18 0938  Weight: 170 lb (77.1 kg)    Physical Exam HEENT shows anicteric sclera oropharynx has no thrush.  Neck has no palpable adenopathy. Chest is bilaterally clear to auscultation. Cardiovascular: S1-S2 regular rate and rhythm. Abdomen is soft nontender with no palpable  hepatospleno megaly. Extremities no edema or cyanosis. Skin no rashes or ulcers.  LABORATORY DATA:  I have reviewed the labs as listed.  CBC    Component Value Date/Time   WBC 26.0 (H) 03/26/2018 1252   RBC 4.97 03/26/2018 1252   HGB 15.8 03/26/2018 1252   HCT 46.6 03/26/2018 1252   PLT 137 (L) 03/26/2018 1252   MCV 93.8 03/26/2018 1252   MCH 31.8 03/26/2018 1252   MCHC 33.9 03/26/2018 1252   RDW 14.6 03/26/2018 1252   LYMPHSABS  15.6 (H) 03/26/2018 1252   MONOABS 3.6 (H) 03/26/2018 1252   EOSABS 0.3 03/26/2018 1252   BASOSABS 0.0 03/26/2018 1252   CMP Latest Ref Rng & Units 03/26/2018 09/27/2017 05/22/2017  Glucose 65 - 99 mg/dL 120(H) 77 87  BUN 6 - 20 mg/dL 27(H) 29(H) 27(H)  Creatinine 0.61 - 1.24 mg/dL 3.16(H) 3.40(H) 3.26(H)  Sodium 135 - 145 mmol/L 141 138 140  Potassium 3.5 - 5.1 mmol/L 4.0 4.0 4.1  Chloride 101 - 111 mmol/L 107 106 105  CO2 22 - 32 mmol/L 24 22 24   Calcium 8.9 - 10.3 mg/dL 9.5 9.7 9.7  Total Protein 6.5 - 8.1 g/dL 6.7 7.4 7.4  Total Bilirubin 0.3 - 1.2 mg/dL 0.9 0.7 0.8  Alkaline Phos 38 - 126 U/L 66 72 62  AST 15 - 41 U/L 23 28 22   ALT 17 - 63 U/L 19 29 19          ASSESSMENT & PLAN:   CLL (chronic lymphocytic leukemia) (HCC) 1.  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  Jak 2 and BCR/ABL negative. - Does not have any fevers, night sweats or weight loss.  Mild tiredness is stable.  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  Denies any infections in the last 6 months. -White count is stable around 20 6K.  Hemoglobin is 15.8 and platelet count is 137. -Today's examination did not reveal any palpable adenopathy or splenomegaly.  We can safely monitor him at 6 monthly intervals.  2.  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  This was noted on review of chart after patient left.  At next visit, I will make sure that he is getting abdominal CT at least annually. - CKD with stable creatinine of 3.16.       Orders placed this encounter:  Orders Placed This Encounter  Procedures  . CBC with Differential  . Comprehensive metabolic panel  . Lactate dehydrogenase      Derek Jack, MD [place] [phone]

## 2018-04-07 DIAGNOSIS — R972 Elevated prostate specific antigen [PSA]: Secondary | ICD-10-CM | POA: Diagnosis not present

## 2018-04-16 ENCOUNTER — Encounter: Payer: Self-pay | Admitting: Radiation Oncology

## 2018-05-07 ENCOUNTER — Encounter: Payer: Self-pay | Admitting: Radiation Oncology

## 2018-05-07 NOTE — Progress Notes (Signed)
GU Location of Tumor / Histology: prostatic adenocarcinoma  If Prostate Cancer, Gleason Score is (3 + 4) and PSA is (8.8). Prostate volume: 17 grams  Keith Rivera reports his PCP told him his PSA was elevated/rising one year ago. Patient returned to Dr. Louis Meckel in May 2019 for kidney cancer follow up and to discuss rising PSA. Patient complained of a weak stream and having to strain to void.   Biopsies of prostate (if applicable) revealed:    Past/Anticipated interventions by urology, if any: prostate biopsy, no scans ordered, referral to radiation oncology   Past/Anticipated interventions by medical oncology, if any: no  Weight changes, if any: no  Bowel/Bladder complaints, if any: Reports a weak stream, having to strain to void, urgency. Denies dysuria, hematuria, urinary leakage or incontinence.  Nausea/Vomiting, if any: no  Pain issues, if any:  Intermittent left hip pain. Effected by activity.  SAFETY ISSUES:  Prior radiation? no  Pacemaker/ICD? no  Possible current pregnancy? no  Is the patient on methotrexate? no  Current Complaints / other details:  67 year old male. NKDA. Married. Everyday smoker. Resides in [place]. Has been a Social research officer, government for 40 years. Had a TURP 13 years ago when he had problems voiding.

## 2018-05-12 ENCOUNTER — Ambulatory Visit: Payer: Medicare Other | Admitting: Radiation Oncology

## 2018-05-12 ENCOUNTER — Encounter: Payer: Self-pay | Admitting: Radiation Oncology

## 2018-05-12 ENCOUNTER — Ambulatory Visit
Admission: RE | Admit: 2018-05-12 | Discharge: 2018-05-12 | Disposition: A | Discharge status: Home / Self Care | Payer: Medicare Other | Source: Ambulatory Visit | Attending: Radiation Oncology | Admitting: Radiation Oncology

## 2018-05-12 ENCOUNTER — Other Ambulatory Visit: Payer: Self-pay

## 2018-05-12 VITALS — BP 124/92 | HR 103 | Temp 98.0°F | Resp 18 | Wt 162.6 lb

## 2018-05-12 DIAGNOSIS — C61 Malignant neoplasm of prostate: Secondary | ICD-10-CM | POA: Insufficient documentation

## 2018-05-12 DIAGNOSIS — Z800 Family history of malignant neoplasm of digestive organs: Secondary | ICD-10-CM | POA: Insufficient documentation

## 2018-05-12 DIAGNOSIS — Z85528 Personal history of other malignant neoplasm of kidney: Secondary | ICD-10-CM | POA: Insufficient documentation

## 2018-05-12 DIAGNOSIS — E785 Hyperlipidemia, unspecified: Secondary | ICD-10-CM | POA: Insufficient documentation

## 2018-05-12 DIAGNOSIS — I341 Nonrheumatic mitral (valve) prolapse: Secondary | ICD-10-CM | POA: Diagnosis not present

## 2018-05-12 DIAGNOSIS — D649 Anemia, unspecified: Secondary | ICD-10-CM | POA: Diagnosis not present

## 2018-05-12 DIAGNOSIS — C9110 Chronic lymphocytic leukemia of B-cell type not having achieved remission: Secondary | ICD-10-CM

## 2018-05-12 DIAGNOSIS — C9190 Lymphoid leukemia, unspecified not having achieved remission: Secondary | ICD-10-CM | POA: Diagnosis not present

## 2018-05-12 DIAGNOSIS — Z79899 Other long term (current) drug therapy: Secondary | ICD-10-CM | POA: Insufficient documentation

## 2018-05-12 DIAGNOSIS — C641 Malignant neoplasm of right kidney, except renal pelvis: Secondary | ICD-10-CM

## 2018-05-12 DIAGNOSIS — F17210 Nicotine dependence, cigarettes, uncomplicated: Secondary | ICD-10-CM | POA: Diagnosis not present

## 2018-05-12 DIAGNOSIS — C649 Malignant neoplasm of unspecified kidney, except renal pelvis: Secondary | ICD-10-CM | POA: Insufficient documentation

## 2018-05-12 HISTORY — DX: Malignant neoplasm of prostate: C61

## 2018-05-12 NOTE — Progress Notes (Signed)
See progress note under physician encounter. 

## 2018-05-12 NOTE — Progress Notes (Signed)
Radiation Oncology         [phone] ________________________________  Initial outpatient Consultation  Name: Keith Rivera MRN: [id number]  Date: 05/12/2018  DOB: October 29, 1951  JS:EGBT, [name], MD  Ardis Hughs, MD   REFERRING PHYSICIAN: Ardis Hughs, MD  DIAGNOSIS: 67 y.o. gentleman with favorable intermediate risk, Stage T2a adenocarcinoma of the prostate with Gleason Score of 3+4, and PSA of 8.8    ICD-10-CM   1. Malignant neoplasm of prostate ([place]) C61   2. CLL (chronic lymphocytic leukemia) (HCC) C91.90   3. Renal cell carcinoma of right kidney (HCC) C64.1     HISTORY OF PRESENT ILLNESS: Keith Rivera is a 67 y.o. male with a diagnosis of prostate cancer. He has a history of stage 1 Renal Cell carcinoma of right kidney and underwent right nephrectomy in 01/2015. He is currently NED. He is also being followed for CLL and is not in active therapy for this. He is being followed by oncology. TURP in 2006 when he had problems when voiding.   He was noted to have an elevated PSA of 8.8 by his primary care physician, Dr. Nevada Crane, [name], MD.  Accordingly, he was referred for evaluation in urology by Dr. Ardis Hughs, MD on 04/14/2018,  digital rectal examination was performed at that time revealing left mid gland 5 mm prostate nodule. The patient proceeded to transrectal ultrasound with 12 biopsies of the prostate on 04/07/2018.  The prostate volume measured 17 grams.  Out of 12 core biopsies, 11 were positive.  The maximum Gleason score was 3+4=7, and this was seen in left base lateral, left base, and left mid.  The patient reviewed the biopsy results with his urologist and he has kindly been referred today for discussion of potential radiation treatment options.     PREVIOUS RADIATION THERAPY: No  PAST MEDICAL HISTORY:  Past Medical History:  Diagnosis Date  . Anemia   . BPH (benign prostatic hyperplasia)   . Chronic kidney disease   . History of  hydronephrosis   . Hyperlipidemia   . MVP (mitral valve prolapse)    Mild mitral regurgitation 2014  . Prostate cancer ([place])       PAST SURGICAL HISTORY: Past Surgical History:  Procedure Laterality Date  . CATARACT EXTRACTION W/PHACO Right 11/28/2015   Procedure: CATARACT EXTRACTION PHACO AND INTRAOCULAR LENS PLACEMENT (IOC);  Surgeon: Williams Che, MD;  Location: [facility];  Service: Ophthalmology;  Laterality: Right;  CDE: 7.14  . COLONOSCOPY N/A 02/08/2015   Procedure: COLONOSCOPY;  Surgeon: Daneil Dolin, MD;  Location: [facility];  Service: Endoscopy;  Laterality: N/A;  200  . COLONOSCOPY N/A 02/26/2018   Procedure: COLONOSCOPY;  Surgeon: Daneil Dolin, MD;  Location: [facility];  Service: Endoscopy;  Laterality: N/A;  1:00  . CYSTOSCOPY WITH RETROGRADE PYELOGRAM, URETEROSCOPY AND STENT PLACEMENT Bilateral 12/03/2014   Procedure: CYSTOSCOPY WITH BILATERAL RETROGRADE PYELOGRAM, BILATERAL  URETEROSCOPY ;  Surgeon: Claybon Jabs, MD;  Location: [facility];  Service: Urology;  Laterality: Bilateral;  . INGUINAL HERNIA REPAIR Left   . LAPAROSCOPIC NEPHRECTOMY Right 02/18/2015   Procedure: LAPAROSCOPIC RIGHT RADICAL NEPHRECTOMY;  Surgeon: Ardis Hughs, MD;  Location: [facility];  Service: Urology;  Laterality: Right;  . NEPHROSTOMY  2006   DUE TO ENLARGED PROSTATE  . POLYPECTOMY  02/26/2018   Procedure: POLYPECTOMY;  Surgeon: Daneil Dolin, MD;  [facility];  Service: Endoscopy;;  ascending colon  . TONSILLECTOMY    .  TRANSURETHRAL RESECTION OF PROSTATE    . VASECTOMY      FAMILY HISTORY:  Family History  Problem Relation Age of Onset  . Heart attack Father        Died age 30  . Colon cancer Maternal Grandfather        In his 58s    SOCIAL HISTORY:  Social History   Socioeconomic History  . Marital status: Married    Spouse name: Not on file  . Number of children: 0  . Years of education: Not on file  . Highest education level: Not on file  Occupational  History  . 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 Financial resource strain: Not hard at all  . Food insecurity:    Worry: Never true    Inability: Never true  . Transportation needs:    Medical: No    Non-medical: No  Tobacco Use  . Smoking status: Current Every Day Smoker    Packs/day: 1.00    Years: 40.00    Pack years: 40.00    Types: Cigarettes    Start date: 06/29/1971  . Smokeless tobacco: Never Used  Substance and Sexual Activity  . Alcohol use: Yes    Alcohol/week: 0.0 oz    Comment: Rare  . Drug use: No  . Sexual activity: Never    Birth control/protection: None  Lifestyle  . Physical activity:    Days per week: 7 days    Minutes per session: 30 min  . Stress: Not at all  Relationships  . Social connections:    Talks on phone: Never    Gets together: Never    Attends religious service: Never    Active member of club or organization: Yes    Attends meetings of clubs or organizations: More than 4 times per year    Relationship status: Married  . Intimate partner violence:    Fear of current or ex partner: No    Emotionally abused: No    Physically abused: No    Forced sexual activity: No  Other Topics Concern  . Not on file  Social History Narrative  . Not on file  Nolan is doing well today. He is accompanied by his wife. He smokes everyday. 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 He works full time in [place], where he and his wife reside. He reports that it takes him about 45 minutes to get to the [facility] from his home or work in [place].   ALLERGIES: Patient has no known allergies.  MEDICATIONS:  Current Outpatient Medications  Medication Sig Dispense Refill  . acetaminophen (TYLENOL) 500 MG tablet Take 1,000 mg by mouth every 6 (six) hours as needed for moderate pain or headache.    Marland Kitchen atorvastatin (LIPITOR) 20 MG tablet Take 20 mg by mouth daily.     . cholecalciferol (VITAMIN D)  1000 UNITS tablet Take 1,000 Units by mouth every morning. For kidney function    . Coenzyme Q10 (CO Q10 PO) Take 1 tablet by mouth at bedtime.     . Cyanocobalamin (VITAMIN B-12 PO) Take 1 tablet by mouth daily.    Marland Kitchen doxycycline (PERIOSTAT) 20 MG tablet Take 20 mg by mouth daily.     . Multiple Vitamin (MULTIVITAMIN WITH MINERALS) TABS tablet Take 1 tablet by mouth every morning.     . Naphazoline HCl (CLEAR EYES OP) Place 2 drops into both eyes daily  as needed (for dry eyes).    . tamsulosin (FLOMAX) 0.4 MG CAPS capsule Take 0.8 mg by mouth.    . polyethylene glycol-electrolytes (TRILYTE) 420 g solution Take 4,000 mLs by mouth as directed. (Patient not taking: Reported on 05/12/2018) 4000 mL 0  . sodium bicarbonate 650 MG tablet Take 650 mg by mouth 4 (four) times daily.      No current facility-administered medications for this encounter.     REVIEW OF SYSTEMS:  On review of systems, the patient reports that he is doing well overall. He denies any chest pain, shortness of breath, cough, fevers, chills, night sweats, unintended weight changes. He denies any bowel disturbances, and denies abdominal pain, nausea or vomiting. He denies any new musculoskeletal or joint aches or pains. His IPSS was 9, indicating moderate urinary symptoms. The patient endorses intermittent left hip pain that is exacerbated by activity. He also experiences a weak stream, having to strain when voiding and urgency.  He is not able to complete sexual activity with most attempts. A complete review of systems is obtained and is otherwise negative.    PHYSICAL EXAM:  Wt Readings from Last 3 Encounters:  05/12/18 162 lb 9.6 oz (73.8 kg)  05/12/18 162 lb 9.6 oz (73.8 kg)  03/27/18 170 lb (77.1 kg)   Temp Readings from Last 3 Encounters:  05/12/18 98 F (36.7 C)  05/12/18 98 F (36.7 C)  03/27/18 97.7 F (36.5 C) (Oral)   BP Readings from Last 3 Encounters:  05/12/18 (!) 124/92  05/12/18 (!) 124/92  03/27/18 100/84    Pulse Readings from Last 3 Encounters:  05/12/18 (!) 103  05/12/18 (!) 103  03/27/18 73   Pain Assessment Pain Score: 0-No pain/10  In general this is a well appearing Caucasian male in no acute distress. He is alert and oriented x4 and appropriate throughout the examination. HEENT reveals that the patient is normocephalic, atraumatic. EOMs are intact.  Cardiopulmonary assessment is negative for acute distress and he exhibits normal effort.   KPS = 100  100 - Normal; no complaints; no evidence of disease. 90   - Able to carry on normal activity; minor signs or symptoms of disease. 80   - Normal activity with effort; some signs or symptoms of disease. 9   - Cares for self; unable to carry on normal activity or to do active work. 60   - Requires occasional assistance, but is able to care for most of his personal needs. 50   - Requires considerable assistance and frequent medical care. 55   - Disabled; requires special care and assistance. 43   - Severely disabled; hospital admission is indicated although death not imminent. 33   - Very sick; hospital admission necessary; active supportive treatment necessary. 10   - Moribund; fatal processes progressing rapidly. 0     - Dead  Karnofsky DA, Abelmann [place], Craver LS and Burchenal [facility] [phone]) The use of the nitrogen mustards in the palliative treatment of carcinoma: with particular reference to bronchogenic carcinoma Cancer 1 634-56  LABORATORY DATA:  Lab Results  Component Value Date   WBC 26.0 (H) 03/26/2018   HGB 15.8 03/26/2018   HCT 46.6 03/26/2018   MCV 93.8 03/26/2018   PLT 137 (L) 03/26/2018   Lab Results  Component Value Date   NA 141 03/26/2018   K 4.0 03/26/2018   CL 107 03/26/2018   CO2 24 03/26/2018   Lab Results  Component Value Date   ALT 19  03/26/2018   AST 23 03/26/2018   ALKPHOS 66 03/26/2018   BILITOT 0.9 03/26/2018     RADIOGRAPHY: No results found.    IMPRESSION/PLAN: 1. 67 y.o. gentleman with  favorable intermediate risk, Stage T2a adenocarcinoma of the prostate with Gleason Score of 3+4=7, and PSA of 8.8. We discussed the patient's workup and outlines the nature of prostate cancer in this setting. The patient's T stage, Gleason's score, and PSA put him into the favorable intermediate risk group. Accordingly, he is eligible for a variety of potential treatment options including 5 1/2 weeks of external radiation with fiducial marker placement and SpaceOAR gel, versus surgical resection. We discussed the available radiation techniques, and focused on the details and logistics and delivery. We discussed and outlined the risks, benefits, short and long-term effects associated with radiotherapy and compared and contrasted these with prostatectomy. We discussed the role of SpaceOAR in reducing the rectal toxicity associated with radiotherapy. He would like to consider his options. I will call him next week to see if he's made treatment decisions. 2. History of Stage I Renal Cell Carcinoma. The patient continues to be NED and is followed by Dr. Louis Meckel. He also follows with [facility] for his left renal functional capacity. 3. History of CLL. The patient continues to follow up with Dr. Delton Coombes in medical oncology at [facility]. We will follow this expectantly.  In a visit lasting 60 minutes, greater than 50% of the time was spent face to face discussing his case, and coordinating the patient's care.    Carola Rhine, PAC    Tyler Pita, MD  [facility] Direct Dial: [phone]  Fax: [phone] [email or web address]  Skype  [email or web address]   This document serves as a record of services personally performed by Tyler Pita, MD and Carola Rhine, PA-C. It was created on their behalf by Margit Banda, a trained medical scribe. The creation of this record is based on the scribe's personal observations and the provider's statements to  them. This document has been checked and approved by the attending provider.

## 2018-05-20 ENCOUNTER — Telehealth: Payer: Self-pay | Admitting: *Deleted

## 2018-05-20 NOTE — Telephone Encounter (Signed)
Called patient to inform of fid. markers and space oar for August 13 @ Dr. Carlton Adam Office and his sim on August 15 @ Dr. Johny Shears Office, no answer will call later

## 2018-05-21 DIAGNOSIS — H00016 Hordeolum externum left eye, unspecified eyelid: Secondary | ICD-10-CM | POA: Diagnosis not present

## 2018-05-29 ENCOUNTER — Other Ambulatory Visit: Payer: Self-pay | Admitting: Urology

## 2018-05-29 DIAGNOSIS — C61 Malignant neoplasm of prostate: Secondary | ICD-10-CM

## 2018-06-05 DIAGNOSIS — Z6822 Body mass index (BMI) 22.0-22.9, adult: Secondary | ICD-10-CM | POA: Diagnosis not present

## 2018-06-05 DIAGNOSIS — L03211 Cellulitis of face: Secondary | ICD-10-CM | POA: Diagnosis not present

## 2018-06-10 ENCOUNTER — Telehealth: Payer: Self-pay | Admitting: *Deleted

## 2018-06-10 DIAGNOSIS — C61 Malignant neoplasm of prostate: Secondary | ICD-10-CM | POA: Diagnosis not present

## 2018-06-10 NOTE — Telephone Encounter (Signed)
CALLED PATIENT TO INFORM OF CT SIM FOR 06-12-18 - ARRIVAL TIME- 2:45 PM @ [facility] ON 06-14-18 - ARRIVAL TIME - 3:30 PM, NO RESTRICTIONS TO TEST, TEST TO BE @ [facility], SPOKE WITH PATIENT AND HE IS AWARE OF THESE APPTS.

## 2018-06-12 ENCOUNTER — Ambulatory Visit
Admission: RE | Admit: 2018-06-12 | Discharge: 2018-06-12 | Disposition: A | Discharge status: Home / Self Care | Payer: Medicare Other | Source: Ambulatory Visit | Attending: Radiation Oncology | Admitting: Radiation Oncology

## 2018-06-12 DIAGNOSIS — C61 Malignant neoplasm of prostate: Secondary | ICD-10-CM | POA: Diagnosis present

## 2018-06-12 DIAGNOSIS — Z510 Encounter for antineoplastic radiation therapy: Secondary | ICD-10-CM | POA: Diagnosis not present

## 2018-06-12 NOTE — Progress Notes (Signed)
  Radiation Oncology         [phone] ________________________________  Name: Keith Rivera MRN: [id number]  Date: 06/12/2018  DOB: 06/12/1951  SIMULATION AND TREATMENT PLANNING NOTE    ICD-10-CM   1. Malignant neoplasm of prostate ([place]) C61     DIAGNOSIS:  67 y.o. gentleman with favorable intermediate risk, Stage T2a adenocarcinoma of the prostate with Gleason Score of 3+4, and PSA of 8.8  NARRATIVE:  The patient was brought to the [facility].  Identity was confirmed.  All relevant records and images related to the planned course of therapy were reviewed.  The patient freely provided informed written consent to proceed with treatment after reviewing the details related to the planned course of therapy. The consent form was witnessed and verified by the simulation staff.  Then, the patient was set-up in a stable reproducible supine position for radiation therapy.  A vacuum lock pillow device was custom fabricated to position his legs in a reproducible immobilized position.  Then, I performed a urethrogram under sterile conditions to identify the prostatic apex.  CT images were obtained.  Surface markings were placed.  The CT images were loaded into the planning software.  Then the prostate target and avoidance structures including the rectum, bladder, bowel and hips were contoured.  Treatment planning then occurred.  The radiation prescription was entered and confirmed.  A total of one complex treatment devices was fabricated. I have requested : Intensity Modulated Radiotherapy (IMRT) is medically necessary for this case for the following reason:  Rectal sparing.Marland Kitchen  PLAN:  The patient will receive 70 Gy in 28 fractions.  ________________________________  Sheral Apley Tammi Klippel, M.D.

## 2018-06-14 ENCOUNTER — Ambulatory Visit (HOSPITAL_COMMUNITY)
Admission: RE | Admit: 2018-06-14 | Discharge: 2018-06-14 | Disposition: A | Discharge status: Home / Self Care | Payer: Medicare Other | Source: Ambulatory Visit | Attending: Urology | Admitting: Urology

## 2018-06-14 DIAGNOSIS — C61 Malignant neoplasm of prostate: Secondary | ICD-10-CM

## 2018-06-20 DIAGNOSIS — Z510 Encounter for antineoplastic radiation therapy: Secondary | ICD-10-CM | POA: Diagnosis not present

## 2018-06-20 DIAGNOSIS — C61 Malignant neoplasm of prostate: Secondary | ICD-10-CM | POA: Diagnosis not present

## 2018-06-24 ENCOUNTER — Ambulatory Visit: Payer: Medicare Other

## 2018-06-24 ENCOUNTER — Ambulatory Visit
Admission: RE | Admit: 2018-06-24 | Discharge: 2018-06-24 | Disposition: A | Discharge status: Home / Self Care | Payer: Medicare Other | Source: Ambulatory Visit | Attending: Radiation Oncology | Admitting: Radiation Oncology

## 2018-06-24 ENCOUNTER — Encounter: Payer: Self-pay | Admitting: Medical Oncology

## 2018-06-24 DIAGNOSIS — Z510 Encounter for antineoplastic radiation therapy: Secondary | ICD-10-CM | POA: Diagnosis not present

## 2018-06-24 DIAGNOSIS — C61 Malignant neoplasm of prostate: Secondary | ICD-10-CM | POA: Diagnosis not present

## 2018-06-25 ENCOUNTER — Ambulatory Visit: Payer: Medicare Other

## 2018-06-25 ENCOUNTER — Ambulatory Visit
Admission: RE | Admit: 2018-06-25 | Discharge: 2018-06-25 | Disposition: A | Discharge status: Home / Self Care | Payer: Medicare Other | Source: Ambulatory Visit | Attending: Radiation Oncology | Admitting: Radiation Oncology

## 2018-06-25 DIAGNOSIS — Z510 Encounter for antineoplastic radiation therapy: Secondary | ICD-10-CM | POA: Diagnosis not present

## 2018-06-25 DIAGNOSIS — C61 Malignant neoplasm of prostate: Secondary | ICD-10-CM | POA: Diagnosis not present

## 2018-06-26 ENCOUNTER — Ambulatory Visit
Admission: RE | Admit: 2018-06-26 | Discharge: 2018-06-26 | Disposition: A | Discharge status: Home / Self Care | Payer: Medicare Other | Source: Ambulatory Visit | Attending: Radiation Oncology | Admitting: Radiation Oncology

## 2018-06-26 ENCOUNTER — Ambulatory Visit: Payer: Medicare Other

## 2018-06-26 DIAGNOSIS — C61 Malignant neoplasm of prostate: Secondary | ICD-10-CM | POA: Diagnosis not present

## 2018-06-26 DIAGNOSIS — Z510 Encounter for antineoplastic radiation therapy: Secondary | ICD-10-CM | POA: Diagnosis not present

## 2018-06-27 ENCOUNTER — Ambulatory Visit
Admission: RE | Admit: 2018-06-27 | Discharge: 2018-06-27 | Disposition: A | Discharge status: Home / Self Care | Payer: Medicare Other | Source: Ambulatory Visit | Attending: Radiation Oncology | Admitting: Radiation Oncology

## 2018-06-27 ENCOUNTER — Ambulatory Visit: Payer: Medicare Other

## 2018-06-27 DIAGNOSIS — Z510 Encounter for antineoplastic radiation therapy: Secondary | ICD-10-CM | POA: Diagnosis not present

## 2018-06-27 DIAGNOSIS — C61 Malignant neoplasm of prostate: Secondary | ICD-10-CM | POA: Diagnosis not present

## 2018-07-01 ENCOUNTER — Ambulatory Visit: Payer: Medicare Other

## 2018-07-01 ENCOUNTER — Ambulatory Visit
Admission: RE | Admit: 2018-07-01 | Discharge: 2018-07-01 | Disposition: A | Discharge status: Home / Self Care | Payer: Medicare Other | Source: Ambulatory Visit | Attending: Radiation Oncology | Admitting: Radiation Oncology

## 2018-07-01 DIAGNOSIS — C61 Malignant neoplasm of prostate: Secondary | ICD-10-CM | POA: Diagnosis present

## 2018-07-01 DIAGNOSIS — Z510 Encounter for antineoplastic radiation therapy: Secondary | ICD-10-CM | POA: Insufficient documentation

## 2018-07-02 ENCOUNTER — Ambulatory Visit
Admission: RE | Admit: 2018-07-02 | Discharge: 2018-07-02 | Disposition: A | Discharge status: Home / Self Care | Payer: Medicare Other | Source: Ambulatory Visit | Attending: Radiation Oncology | Admitting: Radiation Oncology

## 2018-07-02 ENCOUNTER — Ambulatory Visit: Payer: Medicare Other

## 2018-07-02 DIAGNOSIS — Z510 Encounter for antineoplastic radiation therapy: Secondary | ICD-10-CM | POA: Diagnosis not present

## 2018-07-02 DIAGNOSIS — C61 Malignant neoplasm of prostate: Secondary | ICD-10-CM | POA: Diagnosis not present

## 2018-07-03 ENCOUNTER — Ambulatory Visit: Payer: Medicare Other

## 2018-07-03 ENCOUNTER — Ambulatory Visit
Admission: RE | Admit: 2018-07-03 | Discharge: 2018-07-03 | Disposition: A | Discharge status: Home / Self Care | Payer: Medicare Other | Source: Ambulatory Visit | Attending: Radiation Oncology | Admitting: Radiation Oncology

## 2018-07-03 DIAGNOSIS — C61 Malignant neoplasm of prostate: Secondary | ICD-10-CM | POA: Diagnosis not present

## 2018-07-03 DIAGNOSIS — Z510 Encounter for antineoplastic radiation therapy: Secondary | ICD-10-CM | POA: Diagnosis not present

## 2018-07-04 ENCOUNTER — Ambulatory Visit
Admission: RE | Admit: 2018-07-04 | Discharge: 2018-07-04 | Disposition: A | Discharge status: Home / Self Care | Payer: Medicare Other | Source: Ambulatory Visit | Attending: Radiation Oncology | Admitting: Radiation Oncology

## 2018-07-04 ENCOUNTER — Ambulatory Visit: Payer: Medicare Other

## 2018-07-04 DIAGNOSIS — Z510 Encounter for antineoplastic radiation therapy: Secondary | ICD-10-CM | POA: Diagnosis not present

## 2018-07-04 DIAGNOSIS — C61 Malignant neoplasm of prostate: Secondary | ICD-10-CM | POA: Diagnosis not present

## 2018-07-07 ENCOUNTER — Ambulatory Visit: Payer: Medicare Other

## 2018-07-07 ENCOUNTER — Ambulatory Visit
Admission: RE | Admit: 2018-07-07 | Discharge: 2018-07-07 | Disposition: A | Discharge status: Home / Self Care | Payer: Medicare Other | Source: Ambulatory Visit | Attending: Radiation Oncology | Admitting: Radiation Oncology

## 2018-07-07 DIAGNOSIS — Z510 Encounter for antineoplastic radiation therapy: Secondary | ICD-10-CM | POA: Diagnosis not present

## 2018-07-07 DIAGNOSIS — C61 Malignant neoplasm of prostate: Secondary | ICD-10-CM | POA: Diagnosis not present

## 2018-07-08 ENCOUNTER — Ambulatory Visit
Admission: RE | Admit: 2018-07-08 | Discharge: 2018-07-08 | Disposition: A | Discharge status: Home / Self Care | Payer: Medicare Other | Source: Ambulatory Visit | Attending: Radiation Oncology | Admitting: Radiation Oncology

## 2018-07-08 ENCOUNTER — Ambulatory Visit: Payer: Medicare Other

## 2018-07-08 DIAGNOSIS — L03211 Cellulitis of face: Secondary | ICD-10-CM | POA: Diagnosis not present

## 2018-07-08 DIAGNOSIS — C61 Malignant neoplasm of prostate: Secondary | ICD-10-CM | POA: Diagnosis not present

## 2018-07-08 DIAGNOSIS — Z510 Encounter for antineoplastic radiation therapy: Secondary | ICD-10-CM | POA: Diagnosis not present

## 2018-07-08 DIAGNOSIS — J340 Abscess, furuncle and carbuncle of nose: Secondary | ICD-10-CM | POA: Diagnosis not present

## 2018-07-09 ENCOUNTER — Ambulatory Visit: Payer: Medicare Other

## 2018-07-09 ENCOUNTER — Ambulatory Visit
Admission: RE | Admit: 2018-07-09 | Discharge: 2018-07-09 | Disposition: A | Discharge status: Home / Self Care | Payer: Medicare Other | Source: Ambulatory Visit | Attending: Radiation Oncology | Admitting: Radiation Oncology

## 2018-07-09 DIAGNOSIS — Z510 Encounter for antineoplastic radiation therapy: Secondary | ICD-10-CM | POA: Diagnosis not present

## 2018-07-09 DIAGNOSIS — C61 Malignant neoplasm of prostate: Secondary | ICD-10-CM | POA: Diagnosis not present

## 2018-07-10 ENCOUNTER — Ambulatory Visit: Payer: Medicare Other

## 2018-07-10 ENCOUNTER — Ambulatory Visit
Admission: RE | Admit: 2018-07-10 | Discharge: 2018-07-10 | Disposition: A | Discharge status: Home / Self Care | Payer: Medicare Other | Source: Ambulatory Visit | Attending: Radiation Oncology | Admitting: Radiation Oncology

## 2018-07-10 DIAGNOSIS — C61 Malignant neoplasm of prostate: Secondary | ICD-10-CM | POA: Diagnosis not present

## 2018-07-10 DIAGNOSIS — Z510 Encounter for antineoplastic radiation therapy: Secondary | ICD-10-CM | POA: Diagnosis not present

## 2018-07-11 ENCOUNTER — Ambulatory Visit
Admission: RE | Admit: 2018-07-11 | Discharge: 2018-07-11 | Disposition: A | Discharge status: Home / Self Care | Payer: Medicare Other | Source: Ambulatory Visit | Attending: Radiation Oncology | Admitting: Radiation Oncology

## 2018-07-11 ENCOUNTER — Ambulatory Visit: Payer: Medicare Other

## 2018-07-11 DIAGNOSIS — C61 Malignant neoplasm of prostate: Secondary | ICD-10-CM | POA: Diagnosis not present

## 2018-07-11 DIAGNOSIS — Z510 Encounter for antineoplastic radiation therapy: Secondary | ICD-10-CM | POA: Diagnosis not present

## 2018-07-14 ENCOUNTER — Ambulatory Visit
Admission: RE | Admit: 2018-07-14 | Discharge: 2018-07-14 | Disposition: A | Discharge status: Home / Self Care | Payer: Medicare Other | Source: Ambulatory Visit | Attending: Radiation Oncology | Admitting: Radiation Oncology

## 2018-07-14 ENCOUNTER — Ambulatory Visit: Payer: Medicare Other

## 2018-07-14 DIAGNOSIS — C61 Malignant neoplasm of prostate: Secondary | ICD-10-CM | POA: Diagnosis not present

## 2018-07-14 DIAGNOSIS — Z510 Encounter for antineoplastic radiation therapy: Secondary | ICD-10-CM | POA: Diagnosis not present

## 2018-07-15 ENCOUNTER — Ambulatory Visit
Admission: RE | Admit: 2018-07-15 | Discharge: 2018-07-15 | Disposition: A | Discharge status: Home / Self Care | Payer: Medicare Other | Source: Ambulatory Visit | Attending: Radiation Oncology | Admitting: Radiation Oncology

## 2018-07-15 ENCOUNTER — Ambulatory Visit: Payer: Medicare Other

## 2018-07-15 DIAGNOSIS — Z510 Encounter for antineoplastic radiation therapy: Secondary | ICD-10-CM | POA: Diagnosis not present

## 2018-07-15 DIAGNOSIS — C61 Malignant neoplasm of prostate: Secondary | ICD-10-CM | POA: Diagnosis not present

## 2018-07-16 ENCOUNTER — Ambulatory Visit
Admission: RE | Admit: 2018-07-16 | Discharge: 2018-07-16 | Disposition: A | Discharge status: Home / Self Care | Payer: Medicare Other | Source: Ambulatory Visit | Attending: Radiation Oncology | Admitting: Radiation Oncology

## 2018-07-16 ENCOUNTER — Ambulatory Visit: Payer: Medicare Other

## 2018-07-16 DIAGNOSIS — Z510 Encounter for antineoplastic radiation therapy: Secondary | ICD-10-CM | POA: Diagnosis not present

## 2018-07-16 DIAGNOSIS — C61 Malignant neoplasm of prostate: Secondary | ICD-10-CM | POA: Diagnosis not present

## 2018-07-17 ENCOUNTER — Ambulatory Visit: Payer: Medicare Other

## 2018-07-17 ENCOUNTER — Ambulatory Visit
Admission: RE | Admit: 2018-07-17 | Discharge: 2018-07-17 | Disposition: A | Discharge status: Home / Self Care | Payer: Medicare Other | Source: Ambulatory Visit | Attending: Radiation Oncology | Admitting: Radiation Oncology

## 2018-07-17 DIAGNOSIS — Z510 Encounter for antineoplastic radiation therapy: Secondary | ICD-10-CM | POA: Diagnosis not present

## 2018-07-17 DIAGNOSIS — R809 Proteinuria, unspecified: Secondary | ICD-10-CM | POA: Diagnosis not present

## 2018-07-17 DIAGNOSIS — E559 Vitamin D deficiency, unspecified: Secondary | ICD-10-CM | POA: Diagnosis not present

## 2018-07-17 DIAGNOSIS — I10 Essential (primary) hypertension: Secondary | ICD-10-CM | POA: Diagnosis not present

## 2018-07-17 DIAGNOSIS — D509 Iron deficiency anemia, unspecified: Secondary | ICD-10-CM | POA: Diagnosis not present

## 2018-07-17 DIAGNOSIS — Z79899 Other long term (current) drug therapy: Secondary | ICD-10-CM | POA: Diagnosis not present

## 2018-07-17 DIAGNOSIS — C61 Malignant neoplasm of prostate: Secondary | ICD-10-CM | POA: Diagnosis not present

## 2018-07-17 DIAGNOSIS — N183 Chronic kidney disease, stage 3 (moderate): Secondary | ICD-10-CM | POA: Diagnosis not present

## 2018-07-18 ENCOUNTER — Ambulatory Visit: Payer: Medicare Other

## 2018-07-18 ENCOUNTER — Ambulatory Visit
Admission: RE | Admit: 2018-07-18 | Discharge: 2018-07-18 | Disposition: A | Discharge status: Home / Self Care | Payer: Medicare Other | Source: Ambulatory Visit | Attending: Radiation Oncology | Admitting: Radiation Oncology

## 2018-07-18 DIAGNOSIS — Z510 Encounter for antineoplastic radiation therapy: Secondary | ICD-10-CM | POA: Diagnosis not present

## 2018-07-18 DIAGNOSIS — C61 Malignant neoplasm of prostate: Secondary | ICD-10-CM | POA: Diagnosis not present

## 2018-07-18 NOTE — Progress Notes (Signed)
Introduced myself to Keith Rivera as the prostate nurse navigator and my role. I was unable to meet him the day he consulted with Dr. Tammi Klippel. Today is his first radiation. I gave him my business card and asked him to call me with questions or concerns.

## 2018-07-21 ENCOUNTER — Ambulatory Visit
Admission: RE | Admit: 2018-07-21 | Discharge: 2018-07-21 | Disposition: A | Discharge status: Home / Self Care | Payer: Medicare Other | Source: Ambulatory Visit | Attending: Radiation Oncology | Admitting: Radiation Oncology

## 2018-07-21 ENCOUNTER — Ambulatory Visit: Payer: Medicare Other

## 2018-07-21 DIAGNOSIS — Z510 Encounter for antineoplastic radiation therapy: Secondary | ICD-10-CM | POA: Diagnosis not present

## 2018-07-21 DIAGNOSIS — C61 Malignant neoplasm of prostate: Secondary | ICD-10-CM | POA: Diagnosis not present

## 2018-07-22 ENCOUNTER — Ambulatory Visit
Admission: RE | Admit: 2018-07-22 | Discharge: 2018-07-22 | Disposition: A | Discharge status: Home / Self Care | Payer: Medicare Other | Source: Ambulatory Visit | Attending: Radiation Oncology | Admitting: Radiation Oncology

## 2018-07-22 ENCOUNTER — Ambulatory Visit: Payer: Medicare Other

## 2018-07-22 DIAGNOSIS — C61 Malignant neoplasm of prostate: Secondary | ICD-10-CM | POA: Diagnosis not present

## 2018-07-22 DIAGNOSIS — Z510 Encounter for antineoplastic radiation therapy: Secondary | ICD-10-CM | POA: Diagnosis not present

## 2018-07-23 ENCOUNTER — Ambulatory Visit
Admission: RE | Admit: 2018-07-23 | Discharge: 2018-07-23 | Disposition: A | Discharge status: Home / Self Care | Payer: Medicare Other | Source: Ambulatory Visit | Attending: Radiation Oncology | Admitting: Radiation Oncology

## 2018-07-23 ENCOUNTER — Ambulatory Visit: Payer: Medicare Other

## 2018-07-23 DIAGNOSIS — R809 Proteinuria, unspecified: Secondary | ICD-10-CM | POA: Diagnosis not present

## 2018-07-23 DIAGNOSIS — E872 Acidosis: Secondary | ICD-10-CM | POA: Diagnosis not present

## 2018-07-23 DIAGNOSIS — I10 Essential (primary) hypertension: Secondary | ICD-10-CM | POA: Diagnosis not present

## 2018-07-23 DIAGNOSIS — C61 Malignant neoplasm of prostate: Secondary | ICD-10-CM | POA: Diagnosis not present

## 2018-07-23 DIAGNOSIS — Z510 Encounter for antineoplastic radiation therapy: Secondary | ICD-10-CM | POA: Diagnosis not present

## 2018-07-23 DIAGNOSIS — N184 Chronic kidney disease, stage 4 (severe): Secondary | ICD-10-CM | POA: Diagnosis not present

## 2018-07-24 ENCOUNTER — Ambulatory Visit: Payer: Medicare Other

## 2018-07-24 ENCOUNTER — Ambulatory Visit
Admission: RE | Admit: 2018-07-24 | Discharge: 2018-07-24 | Disposition: A | Discharge status: Home / Self Care | Payer: Medicare Other | Source: Ambulatory Visit | Attending: Radiation Oncology | Admitting: Radiation Oncology

## 2018-07-24 DIAGNOSIS — C61 Malignant neoplasm of prostate: Secondary | ICD-10-CM | POA: Diagnosis not present

## 2018-07-24 DIAGNOSIS — Z510 Encounter for antineoplastic radiation therapy: Secondary | ICD-10-CM | POA: Diagnosis not present

## 2018-07-25 ENCOUNTER — Ambulatory Visit
Admission: RE | Admit: 2018-07-25 | Discharge: 2018-07-25 | Disposition: A | Discharge status: Home / Self Care | Payer: Medicare Other | Source: Ambulatory Visit | Attending: Radiation Oncology | Admitting: Radiation Oncology

## 2018-07-25 ENCOUNTER — Ambulatory Visit: Payer: Medicare Other

## 2018-07-25 DIAGNOSIS — C61 Malignant neoplasm of prostate: Secondary | ICD-10-CM | POA: Diagnosis not present

## 2018-07-25 DIAGNOSIS — Z510 Encounter for antineoplastic radiation therapy: Secondary | ICD-10-CM | POA: Diagnosis not present

## 2018-07-28 ENCOUNTER — Ambulatory Visit: Payer: Medicare Other

## 2018-07-28 ENCOUNTER — Ambulatory Visit
Admission: RE | Admit: 2018-07-28 | Discharge: 2018-07-28 | Disposition: A | Discharge status: Home / Self Care | Payer: Medicare Other | Source: Ambulatory Visit | Attending: Radiation Oncology | Admitting: Radiation Oncology

## 2018-07-28 DIAGNOSIS — Z510 Encounter for antineoplastic radiation therapy: Secondary | ICD-10-CM | POA: Diagnosis not present

## 2018-07-28 DIAGNOSIS — C61 Malignant neoplasm of prostate: Secondary | ICD-10-CM | POA: Diagnosis not present

## 2018-07-29 ENCOUNTER — Ambulatory Visit
Admission: RE | Admit: 2018-07-29 | Discharge: 2018-07-29 | Disposition: A | Discharge status: Home / Self Care | Payer: Medicare Other | Source: Ambulatory Visit | Attending: Radiation Oncology | Admitting: Radiation Oncology

## 2018-07-29 ENCOUNTER — Ambulatory Visit: Payer: Medicare Other

## 2018-07-29 DIAGNOSIS — Z510 Encounter for antineoplastic radiation therapy: Secondary | ICD-10-CM | POA: Insufficient documentation

## 2018-07-29 DIAGNOSIS — C61 Malignant neoplasm of prostate: Secondary | ICD-10-CM | POA: Insufficient documentation

## 2018-07-30 ENCOUNTER — Ambulatory Visit: Payer: Medicare Other

## 2018-07-30 ENCOUNTER — Ambulatory Visit
Admission: RE | Admit: 2018-07-30 | Discharge: 2018-07-30 | Disposition: A | Discharge status: Home / Self Care | Payer: Medicare Other | Source: Ambulatory Visit | Attending: Radiation Oncology | Admitting: Radiation Oncology

## 2018-07-30 DIAGNOSIS — Z510 Encounter for antineoplastic radiation therapy: Secondary | ICD-10-CM | POA: Diagnosis not present

## 2018-07-30 DIAGNOSIS — C61 Malignant neoplasm of prostate: Secondary | ICD-10-CM | POA: Diagnosis not present

## 2018-07-31 ENCOUNTER — Ambulatory Visit
Admission: RE | Admit: 2018-07-31 | Discharge: 2018-07-31 | Disposition: A | Discharge status: Home / Self Care | Payer: Medicare Other | Source: Ambulatory Visit | Attending: Radiation Oncology | Admitting: Radiation Oncology

## 2018-07-31 ENCOUNTER — Ambulatory Visit: Payer: Medicare Other

## 2018-07-31 DIAGNOSIS — C61 Malignant neoplasm of prostate: Secondary | ICD-10-CM | POA: Diagnosis not present

## 2018-07-31 DIAGNOSIS — Z510 Encounter for antineoplastic radiation therapy: Secondary | ICD-10-CM | POA: Diagnosis not present

## 2018-08-01 ENCOUNTER — Ambulatory Visit: Payer: Medicare Other

## 2018-08-01 ENCOUNTER — Ambulatory Visit
Admission: RE | Admit: 2018-08-01 | Discharge: 2018-08-01 | Disposition: A | Discharge status: Home / Self Care | Payer: Medicare Other | Source: Ambulatory Visit | Attending: Radiation Oncology | Admitting: Radiation Oncology

## 2018-08-01 ENCOUNTER — Encounter: Payer: Self-pay | Admitting: Radiation Oncology

## 2018-08-01 DIAGNOSIS — Z510 Encounter for antineoplastic radiation therapy: Secondary | ICD-10-CM | POA: Diagnosis not present

## 2018-08-01 DIAGNOSIS — C61 Malignant neoplasm of prostate: Secondary | ICD-10-CM | POA: Diagnosis not present

## 2018-08-04 ENCOUNTER — Ambulatory Visit: Payer: Medicare Other

## 2018-08-05 ENCOUNTER — Ambulatory Visit: Payer: Medicare Other

## 2018-08-06 ENCOUNTER — Ambulatory Visit: Payer: Medicare Other

## 2018-08-06 ENCOUNTER — Other Ambulatory Visit: Payer: Self-pay

## 2018-08-06 NOTE — Patient Outreach (Signed)
[facility]) Care Management  08/06/2018  Keith Rivera 1951-09-09 [id number]   Medication Adherence call to Keith Rivera patient did not answer patient is due on Atorvastatin 20 mg under [facility].  [facility] Direct Dial [phone]  Fax [phone] [email or web address]

## 2018-08-07 ENCOUNTER — Ambulatory Visit: Payer: Medicare Other

## 2018-08-07 NOTE — Progress Notes (Signed)
  Radiation Oncology         [phone] ________________________________  Name: Keith Rivera MRN: [id number]  Date: 08/01/2018  DOB: [birth year]  End of Treatment Note  Diagnosis:   67 y.o. male with favorable intermediaterisk, Stage T2aadenocarcinoma of the prostate with Gleason Score of 3+4, and PSA of 8.8     Indication for treatment:  Curative, Definitive Radiotherapy       Radiation treatment dates:   06/24/2018 - 08/01/2018  Site/dose:   The prostate was treated to 70 Gy in 28 fractions of 2.5 Gy  Beams/energy:   The patient was treated with IMRT using volumetric arc therapy delivering 6 MV X-rays to clockwise and counterclockwise circumferential arcs with a 90 degree collimator offset to avoid dose scalloping.  Image guidance was performed with daily cone beam CT prior to each fraction to align to gold markers in the prostate and assure proper bladder and rectal fill volumes.  Immobilization was achieved with BodyFix custom mold.  Narrative: The patient tolerated radiation treatment relatively well.   He experienced some urinary irritation with occasional urgency, hesitancy/straining, intermittency, incomplete emptying, nocturia x1, and mild dysuria relieved with Tylenol. He denied having hematuria. He did have increased loose stools that resolved with Imodium.   Plan: The patient has completed radiation treatment. He will return to radiation oncology clinic for routine followup in one month. I advised him to call or return sooner if he has any questions or concerns related to his recovery or treatment. ________________________________  Sheral Apley. Tammi Klippel, M.D.  This document serves as a record of services personally performed by Tyler Pita, MD. It was created on his behalf by Rae Lips, a trained medical scribe. The creation of this record is based on the scribe's personal observations and the provider's statements to them. This document has been checked and approved  by the attending provider.

## 2018-08-08 ENCOUNTER — Ambulatory Visit: Payer: Medicare Other

## 2018-08-11 ENCOUNTER — Ambulatory Visit: Payer: Medicare Other

## 2018-08-12 ENCOUNTER — Ambulatory Visit: Payer: Medicare Other

## 2018-08-12 DIAGNOSIS — D225 Melanocytic nevi of trunk: Secondary | ICD-10-CM | POA: Diagnosis not present

## 2018-08-12 DIAGNOSIS — L570 Actinic keratosis: Secondary | ICD-10-CM | POA: Diagnosis not present

## 2018-08-12 DIAGNOSIS — D485 Neoplasm of uncertain behavior of skin: Secondary | ICD-10-CM | POA: Diagnosis not present

## 2018-08-12 DIAGNOSIS — L821 Other seborrheic keratosis: Secondary | ICD-10-CM | POA: Diagnosis not present

## 2018-08-12 DIAGNOSIS — L905 Scar conditions and fibrosis of skin: Secondary | ICD-10-CM | POA: Diagnosis not present

## 2018-08-13 ENCOUNTER — Ambulatory Visit: Payer: Medicare Other

## 2018-08-14 ENCOUNTER — Ambulatory Visit: Payer: Medicare Other

## 2018-08-15 ENCOUNTER — Ambulatory Visit: Payer: Medicare Other

## 2018-08-18 ENCOUNTER — Ambulatory Visit: Payer: Medicare Other

## 2018-08-19 ENCOUNTER — Ambulatory Visit: Payer: Medicare Other

## 2018-08-19 DIAGNOSIS — C9110 Chronic lymphocytic leukemia of B-cell type not having achieved remission: Secondary | ICD-10-CM | POA: Diagnosis not present

## 2018-08-19 DIAGNOSIS — E782 Mixed hyperlipidemia: Secondary | ICD-10-CM | POA: Diagnosis not present

## 2018-08-19 DIAGNOSIS — C641 Malignant neoplasm of right kidney, except renal pelvis: Secondary | ICD-10-CM | POA: Diagnosis not present

## 2018-08-28 DIAGNOSIS — L7682 Other postprocedural complications of skin and subcutaneous tissue: Secondary | ICD-10-CM | POA: Diagnosis not present

## 2018-08-28 DIAGNOSIS — D485 Neoplasm of uncertain behavior of skin: Secondary | ICD-10-CM | POA: Diagnosis not present

## 2018-08-29 DIAGNOSIS — Z0000 Encounter for general adult medical examination without abnormal findings: Secondary | ICD-10-CM | POA: Diagnosis not present

## 2018-08-29 DIAGNOSIS — Z23 Encounter for immunization: Secondary | ICD-10-CM | POA: Diagnosis not present

## 2018-08-29 DIAGNOSIS — E782 Mixed hyperlipidemia: Secondary | ICD-10-CM | POA: Diagnosis not present

## 2018-08-29 DIAGNOSIS — C641 Malignant neoplasm of right kidney, except renal pelvis: Secondary | ICD-10-CM | POA: Diagnosis not present

## 2018-09-04 ENCOUNTER — Encounter: Payer: Self-pay | Admitting: Urology

## 2018-09-04 ENCOUNTER — Other Ambulatory Visit: Payer: Self-pay

## 2018-09-04 ENCOUNTER — Ambulatory Visit
Admission: RE | Admit: 2018-09-04 | Discharge: 2018-09-04 | Disposition: A | Discharge status: Home / Self Care | Payer: Medicare Other | Source: Ambulatory Visit | Attending: Urology | Admitting: Urology

## 2018-09-04 VITALS — BP 120/70 | HR 80 | Temp 98.0°F | Resp 20 | Ht 71.0 in | Wt 162.8 lb

## 2018-09-04 DIAGNOSIS — Z79899 Other long term (current) drug therapy: Secondary | ICD-10-CM | POA: Insufficient documentation

## 2018-09-04 DIAGNOSIS — Z923 Personal history of irradiation: Secondary | ICD-10-CM | POA: Insufficient documentation

## 2018-09-04 DIAGNOSIS — C61 Malignant neoplasm of prostate: Secondary | ICD-10-CM | POA: Diagnosis not present

## 2018-09-04 NOTE — Progress Notes (Signed)
Radiation Oncology         [phone] ________________________________  Name: BAXTER GONZALEZ MRN: [id number]  Date: 09/04/2018  DOB: [birth year]  Post Treatment Note  CC: Celene Squibb, MD  Ardis Hughs, MD  Diagnosis:   67 y.o. male with favorable intermediaterisk, Stage T2aadenocarcinoma of the prostate with Gleason Score of 3+4, and PSA of 8.8     Interval Since Last Radiation:  4 weeks  06/24/2018 - 08/01/2018:   The prostate was treated to 70 Gy in 28 fractions of 2.5 Gy  Narrative:  The patient returns today for routine follow-up.  He tolerated radiation treatment relatively well.   He experienced some urinary irritation with occasional urgency, hesitancy/straining, intermittency, incomplete emptying, nocturia x1, and mild dysuria relieved with Tylenol. He denied having hematuria. He did have increased loose stools that resolved with Imodium.                               On review of systems, the patient states that he is doing very well overall.  His lower urinary tract symptoms have returned to baseline at this point and current IPSS score is 6 indicating mild symptoms only.  He specifically denies gross hematuria, dysuria, excessive daytime frequency, straining to void, incomplete emptying or incontinence.  He reports nocturia x1.  He denies abdominal pain, nausea, vomiting, diarrhea or constipation.  He reports a healthy appetite and is maintaining his weight.  Overall, he is quite pleased with his progress to date.  He does not currently have a scheduled follow-up visit with Dr. Louis Meckel in urology.  ALLERGIES:  has no allergies on file.  Meds: Current Outpatient Medications  Medication Sig Dispense Refill  . atorvastatin (LIPITOR) 20 MG tablet Take 20 mg by mouth daily.     . cholecalciferol (VITAMIN D) 1000 UNITS tablet Take 1,000 Units by mouth every morning. For kidney function    . Coenzyme Q10 (CO Q10 PO) Take 1 tablet by mouth at bedtime.     . Cyanocobalamin  (VITAMIN B-12 PO) Take 1 tablet by mouth daily.    Marland Kitchen doxycycline (PERIOSTAT) 20 MG tablet Take 20 mg by mouth daily.     . Multiple Vitamin (MULTIVITAMIN WITH MINERALS) TABS tablet Take 1 tablet by mouth every morning.     . Naphazoline HCl (CLEAR EYES OP) Place 2 drops into both eyes daily as needed (for dry eyes).    . polyethylene glycol-electrolytes (TRILYTE) 420 g solution Take 4,000 mLs by mouth as directed. 4000 mL 0  . sodium bicarbonate 650 MG tablet Take 650 mg by mouth 4 (four) times daily.     . tamsulosin (FLOMAX) 0.4 MG CAPS capsule Take 0.8 mg by mouth.    Marland Kitchen acetaminophen (TYLENOL) 500 MG tablet Take 1,000 mg by mouth every 6 (six) hours as needed for moderate pain or headache.     No current facility-administered medications for this encounter.     Physical Findings:  height is 5\' 11"  (1.803 m) and weight is 162 lb 12.8 oz (73.8 kg). His oral temperature is 98 F (36.7 C). His blood pressure is 120/70 and his pulse is 80. His respiration is 20 and oxygen saturation is 100%.  Pain Assessment Pain Score: 0-No pain/10 In general this is a well appearing Caucasian male in no acute distress. He's alert and oriented x4 and appropriate throughout the examination. Cardiopulmonary assessment is negative for acute distress and he  exhibits normal effort.   Lab Findings: Lab Results  Component Value Date   WBC 26.0 (H) 03/26/2018   HGB 15.8 03/26/2018   HCT 46.6 03/26/2018   MCV 93.8 03/26/2018   PLT 137 (L) 03/26/2018     Radiographic Findings: No results found.  Impression/Plan: 1. 67 y.o. male with favorable intermediaterisk, Stage T2aadenocarcinoma of the prostate with Gleason Score of 3+4, and PSA of 8.8.   He will continue to follow up with urology for ongoing PSA determinations but does not have a follow-up appointment scheduled with Dr. Louis Meckel at this point. He is advised to call [facility] to set this up if he has not heard from Dr. Carlton Adam staff by early  next week. He understands what to expect with regards to PSA monitoring going forward. I will look forward to following his response to treatment via correspondence with urology, and would be happy to continue to participate in his care if clinically indicated. I talked to the patient about what to expect in the future, including his risk for erectile dysfunction and rectal bleeding. I encouraged him to call or return to the office if he has any questions regarding his previous radiation or possible radiation side effects. He was comfortable with this plan and will follow up as needed.    Nicholos Johns, PA-C

## 2018-09-04 NOTE — Addendum Note (Signed)
Encounter addended by: Malena Edman, RN on: 09/04/2018 9:09 AM  Actions taken: Charge Capture section accepted

## 2018-09-24 ENCOUNTER — Inpatient Hospital Stay (HOSPITAL_COMMUNITY): Payer: Medicare Other | Attending: Hematology

## 2018-09-24 DIAGNOSIS — C9110 Chronic lymphocytic leukemia of B-cell type not having achieved remission: Secondary | ICD-10-CM

## 2018-09-24 LAB — CBC WITH DIFFERENTIAL/PLATELET
Abs Immature Granulocytes: 0.03 10*3/uL (ref 0.00–0.07)
Basophils Absolute: 0.1 10*3/uL (ref 0.0–0.1)
Basophils Relative: 0 %
Eosinophils Absolute: 0.5 10*3/uL (ref 0.0–0.5)
Eosinophils Relative: 2 %
HCT: 51.3 % (ref 39.0–52.0)
Hemoglobin: 16.3 g/dL (ref 13.0–17.0)
IMMATURE GRANULOCYTES: 0 %
LYMPHS ABS: 12.6 10*3/uL — AB (ref 0.7–4.0)
Lymphocytes Relative: 56 %
MCH: 30.9 pg (ref 26.0–34.0)
MCHC: 31.8 g/dL (ref 30.0–36.0)
MCV: 97.2 fL (ref 80.0–100.0)
MONOS PCT: 26 %
Monocytes Absolute: 5.9 10*3/uL — ABNORMAL HIGH (ref 0.1–1.0)
NEUTROS ABS: 3.7 10*3/uL (ref 1.7–7.7)
Neutrophils Relative %: 16 %
PLATELETS: 126 10*3/uL — AB (ref 150–400)
RBC: 5.28 MIL/uL (ref 4.22–5.81)
RDW: 14.9 % (ref 11.5–15.5)
WBC: 22.9 10*3/uL — AB (ref 4.0–10.5)
nRBC: 0 % (ref 0.0–0.2)

## 2018-09-24 LAB — COMPREHENSIVE METABOLIC PANEL
ALT: 23 U/L (ref 0–44)
AST: 22 U/L (ref 15–41)
Albumin: 4.3 g/dL (ref 3.5–5.0)
Alkaline Phosphatase: 67 U/L (ref 38–126)
Anion gap: 6 (ref 5–15)
BUN: 30 mg/dL — ABNORMAL HIGH (ref 8–23)
CHLORIDE: 108 mmol/L (ref 98–111)
CO2: 25 mmol/L (ref 22–32)
Calcium: 9.6 mg/dL (ref 8.9–10.3)
Creatinine, Ser: 3.34 mg/dL — ABNORMAL HIGH (ref 0.61–1.24)
GFR calc non Af Amer: 18 mL/min — ABNORMAL LOW (ref >60)
GFR, EST AFRICAN AMERICAN: 21 mL/min — AB (ref >60)
Glucose, Bld: 91 mg/dL (ref 70–99)
POTASSIUM: 4.3 mmol/L (ref 3.5–5.1)
SODIUM: 139 mmol/L (ref 135–145)
Total Bilirubin: 0.9 mg/dL (ref 0.3–1.2)
Total Protein: 7.3 g/dL (ref 6.5–8.1)

## 2018-09-24 LAB — LACTATE DEHYDROGENASE: LDH: 119 U/L (ref 98–192)

## 2018-10-02 ENCOUNTER — Ambulatory Visit (HOSPITAL_COMMUNITY): Payer: Medicare Other | Admitting: Hematology

## 2018-10-02 ENCOUNTER — Encounter (HOSPITAL_COMMUNITY): Payer: Self-pay | Admitting: Hematology

## 2018-10-02 ENCOUNTER — Other Ambulatory Visit: Payer: Self-pay

## 2018-10-02 ENCOUNTER — Inpatient Hospital Stay (HOSPITAL_COMMUNITY): Payer: Medicare Other | Attending: Hematology | Admitting: Hematology

## 2018-10-02 VITALS — BP 119/70 | HR 74 | Resp 16 | Wt 165.0 lb

## 2018-10-02 DIAGNOSIS — N189 Chronic kidney disease, unspecified: Secondary | ICD-10-CM | POA: Insufficient documentation

## 2018-10-02 DIAGNOSIS — F17210 Nicotine dependence, cigarettes, uncomplicated: Secondary | ICD-10-CM | POA: Insufficient documentation

## 2018-10-02 DIAGNOSIS — Z800 Family history of malignant neoplasm of digestive organs: Secondary | ICD-10-CM | POA: Diagnosis not present

## 2018-10-02 DIAGNOSIS — Z79899 Other long term (current) drug therapy: Secondary | ICD-10-CM | POA: Diagnosis not present

## 2018-10-02 DIAGNOSIS — C9110 Chronic lymphocytic leukemia of B-cell type not having achieved remission: Secondary | ICD-10-CM | POA: Diagnosis not present

## 2018-10-02 DIAGNOSIS — Z8553 Personal history of malignant neoplasm of renal pelvis: Secondary | ICD-10-CM | POA: Diagnosis not present

## 2018-10-02 DIAGNOSIS — Z85528 Personal history of other malignant neoplasm of kidney: Secondary | ICD-10-CM

## 2018-10-02 DIAGNOSIS — Z923 Personal history of irradiation: Secondary | ICD-10-CM | POA: Diagnosis not present

## 2018-10-02 DIAGNOSIS — I341 Nonrheumatic mitral (valve) prolapse: Secondary | ICD-10-CM | POA: Diagnosis not present

## 2018-10-02 DIAGNOSIS — C61 Malignant neoplasm of prostate: Secondary | ICD-10-CM | POA: Insufficient documentation

## 2018-10-02 DIAGNOSIS — Z905 Acquired absence of kidney: Secondary | ICD-10-CM | POA: Insufficient documentation

## 2018-10-02 DIAGNOSIS — E785 Hyperlipidemia, unspecified: Secondary | ICD-10-CM | POA: Insufficient documentation

## 2018-10-02 NOTE — Assessment & Plan Note (Signed)
1.  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  Jak 2 and BCR/ABL negative. - He did not have any fevers, night sweats or weight loss in the last 6 months.  No recurrent infections.  No hospitalizations were reported. -We reviewed his blood work.  White count is more or less stable.  He is not anemic or thrombocytopenic.  Physical examination did not reveal any palpable lymphadenopathy or hepatosplenomegaly. - We will continue to monitor him at 6 monthly intervals.  2.  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  -I will schedule him for a CT of the abdomen and pelvis with oral contrast prior to next visit. - CKD has been stable with creatinine around 3.   3.  Prostate cancer: -He was diagnosed with Gleason 3+4 = 7 prostatic adenocarcinoma on 04/07/2018. -He completed radiation therapy on 08/02/2018.

## 2018-10-02 NOTE — Patient Instructions (Signed)
[facility] at [facility] Discharge Instructions     Thank you for choosing [facility] at [facility] to provide your oncology and hematology care.  To afford each patient quality time with our provider, please arrive at least 15 minutes before your scheduled appointment time.   If you have a lab appointment with the Cancer Center please come in thru the  Main Entrance and check in at the main information desk  You need to re-schedule your appointment should you arrive 10 or more minutes late.  We strive to give you quality time with our providers, and arriving late affects you and other patients whose appointments are after yours.  Also, if you no show three or more times for appointments you may be dismissed from the clinic at the providers discretion.     Again, thank you for choosing [facility].  Our hope is that these requests will decrease the amount of time that you wait before being seen by our physicians.       _____________________________________________________________  Should you have questions after your visit to [facility], please contact our office at [phone] between the hours of 8:00 a.m. and 4:30 p.m.  Voicemails left after 4:00 p.m. will not be returned until the following business day.  For prescription refill requests, have your pharmacy contact our office and allow 72 hours.    Cancer Center Support Programs:   > Cancer Support Group  2nd Tuesday of the month 1pm-2pm, Journey Room    

## 2018-10-02 NOTE — Progress Notes (Signed)
[address] [place], [place] [address]   CLINIC:  Medical Oncology/Hematology  PCP:  Celene Squibb, MD [address] [place] [address] [phone]   REASON FOR VISIT: Follow-up for CLL and newly diagnoses of prostate cancer  CURRENT THERAPY: Observation   INTERVAL HISTORY:  Keith Rivera 67 y.o. male returns for routine follow-up for CLL and prostate cancer. He is here today and doing well. He denies any nausea, vomiting, or diarrhea. Denies any hematuria or any other bleeding. Denies any new pains. Denies any SOB. He reports his appetite and energy level at 100%.  He finished radiation October 5th for his prostate cancer.    REVIEW OF SYSTEMS:  Review of Systems  All other systems reviewed and are negative.    PAST MEDICAL/SURGICAL HISTORY:  Past Medical History:  Diagnosis Date  . Anemia   . BPH (benign prostatic hyperplasia)   . Chronic kidney disease   . History of hydronephrosis   . Hyperlipidemia   . MVP (mitral valve prolapse)    Mild mitral regurgitation 2014  . Prostate cancer [facility])    Past Surgical History:  Procedure Laterality Date  . CATARACT EXTRACTION W/PHACO Right 11/28/2015   Procedure: CATARACT EXTRACTION PHACO AND INTRAOCULAR LENS PLACEMENT (I[facility]);  Surgeon: Williams Che, MD;  Location: [facility] OR[facility];  Service: Ophthalmology;  Laterality: Right;  CDE: 7.14  . COLONOSCOPY N/A 02/08/2015   Procedure: COLONOSCOPY;  Surgeon: Daneil Dolin, MD;  Location: [facility];  Service: Endoscopy;  Laterality: N/A;  200  . COLONOSCOPY N/A 02/26/2018   Procedure: COLONOSCOPY;  Surgeon: Daneil Dolin, MD;  Location: [facility];  Service: Endoscopy;  Laterality: N/A;  1:00  . CYSTOSCOPY WITH RETROGRADE PYELOGRAM, URETEROSCOPY AND STENT PLACEMENT Bilateral 12/03/2014   Procedure: CYSTOSCOPY WITH BILATERAL RETROGRADE PYELOGRAM, BILATERAL  URETEROSCOPY ;  Surgeon: Claybon Jabs, MD;  Location: [facility];  Service: Urology;  Laterality:  Bilateral;  . INGUINAL HERNIA REPAIR Left   . LAPAROSCOPIC NEPHRECTOMY Right 02/18/2015   Procedure: LAPAROSCOPIC RIGHT RADICAL NEPHRECTOMY;  Surgeon: Ardis Hughs, MD;  Location: [facility];  Service: Urology;  Laterality: Right;  . NEPHROSTOMY  2006   DUE TO ENLARGED PROSTATE  . POLYPECTOMY  02/26/2018   Procedure: POLYPECTOMY;  Surgeon: Daneil Dolin, MD;  [facility];  Service: Endoscopy;;  ascending colon  . TONSILLECTOMY    . TRANSURETHRAL RESECTION OF PROSTATE    . VASECTOMY       SOCIAL HISTORY:  Social History   Socioeconomic History  . Marital status: Married    Spouse name: Not on file  . Number of children: 0  . Years of education: Not on file  . Highest education level: Not on file  Occupational History  . 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 Financial resource strain: Not hard at all  . Food insecurity:    Worry: Never true    Inability: Never true  . Transportation needs:    Medical: No    Non-medical: No  Tobacco Use  . Smoking status: Current Every Day Smoker    Packs/day: 1.00    Years: 40.00    Pack years: 40.00    Types: Cigarettes    Start date: 06/29/1971  . Smokeless tobacco: Never Used  Substance and Sexual Activity  . Alcohol use: Yes    Alcohol/week: 0.0 standard drinks    Comment: Rare  .  Drug use: No  . Sexual activity: Never    Birth control/protection: None  Lifestyle  . Physical activity:    Days per week: 7 days    Minutes per session: 30 min  . Stress: Not at all  Relationships  . Social connections:    Talks on phone: Never    Gets together: Never    Attends religious service: Never    Active member of club or organization: Yes    Attends meetings of clubs or organizations: More than 4 times per year    Relationship status: Married  . Intimate partner violence:    Fear of current or ex partner: No    Emotionally abused: No    Physically abused: No    Forced  sexual activity: No  Other Topics Concern  . Not on file  Social History Narrative  . Not on file    FAMILY HISTORY:  Family History  Problem Relation Age of Onset  . Heart attack Father        Died age 59  . Colon cancer Maternal Grandfather        In his 34s    CURRENT MEDICATIONS:  Outpatient Encounter Medications as of 10/02/2018  Medication Sig  . atorvastatin (LIPITOR) 20 MG tablet Take 20 mg by mouth daily.   . cholecalciferol (VITAMIN D) 1000 UNITS tablet Take 1,000 Units by mouth every morning. For kidney function  . Coenzyme Q10 (CO Q10 PO) Take 1 tablet by mouth at bedtime.   . Cyanocobalamin (VITAMIN B-12 PO) Take 1 tablet by mouth daily.  . Multiple Vitamin (MULTIVITAMIN WITH MINERALS) TABS tablet Take 1 tablet by mouth every morning.   . sodium bicarbonate 650 MG tablet Take 650 mg by mouth 4 (four) times daily.   . tamsulosin (FLOMAX) 0.4 MG CAPS capsule Take 0.8 mg by mouth.  . [DISCONTINUED] acetaminophen (TYLENOL) 500 MG tablet Take 1,000 mg by mouth every 6 (six) hours as needed for moderate pain or headache.  . [DISCONTINUED] doxycycline (PERIOSTAT) 20 MG tablet Take 20 mg by mouth daily.   . [DISCONTINUED] Naphazoline HCl (CLEAR EYES OP) Place 2 drops into both eyes daily as needed (for dry eyes).  . [DISCONTINUED] polyethylene glycol-electrolytes (TRILYTE) 420 g solution Take 4,000 mLs by mouth as directed.   No facility-administered encounter medications on file as of 10/02/2018.     ALLERGIES:  Not on File   PHYSICAL EXAM:  ECOG Performance status: 1  Vitals:   10/02/18 1157  BP: 119/70  Pulse: 74  Resp: 16  SpO2: 100%   Filed Weights   10/02/18 1157  Weight: 165 lb (74.8 kg)    Physical Exam  Constitutional: He is oriented to person, place, and time. He appears well-developed and well-nourished.  Musculoskeletal: Normal range of motion.  Neurological: He is alert and oriented to person, place, and time.  Skin: Skin is warm and dry.    Psychiatric: He has a normal mood and affect. His behavior is normal. Judgment and thought content normal.     LABORATORY DATA:  I have reviewed the labs as listed.  CBC    Component Value Date/Time   WBC 22.9 (H) 09/24/2018 1059   RBC 5.28 09/24/2018 1059   HGB 16.3 09/24/2018 1059   HCT 51.3 09/24/2018 1059   PLT 126 (L) 09/24/2018 1059   MCV 97.2 09/24/2018 1059   MCH 30.9 09/24/2018 1059   MCHC 31.8 09/24/2018 1059   RDW 14.9 09/24/2018 1059   LYMPHSABS  12.6 (H) 09/24/2018 1059   MONOABS 5.9 (H) 09/24/2018 1059   EOSABS 0.5 09/24/2018 1059   BASOSABS 0.1 09/24/2018 1059   CMP Latest Ref Rng & Units 09/24/2018 03/26/2018 09/27/2017  Glucose 70 - 99 mg/dL 91 120(H) 77  BUN 8 - 23 mg/dL 30(H) 27(H) 29(H)  Creatinine 0.61 - 1.24 mg/dL 3.34(H) 3.16(H) 3.40(H)  Sodium 135 - 145 mmol/L 139 141 138  Potassium 3.5 - 5.1 mmol/L 4.3 4.0 4.0  Chloride 98 - 111 mmol/L 108 107 106  CO2 22 - 32 mmol/L 25 24 22   Calcium 8.9 - 10.3 mg/dL 9.6 9.5 9.7  Total Protein 6.5 - 8.1 g/dL 7.3 6.7 7.4  Total Bilirubin 0.3 - 1.2 mg/dL 0.9 0.9 0.7  Alkaline Phos 38 - 126 U/L 67 66 72  AST 15 - 41 U/L 22 23 28   ALT 0 - 44 U/L 23 19 29        DIAGNOSTIC IMAGING:  I have independently reviewed the scans and discussed with the patient. .  I have reviewed Francene Finders, NP's note and agree with the documentation.  I personally performed a face-to-face visit, made revisions and my assessment and plan is as follows.     ASSESSMENT & PLAN:   CLL (chronic lymphocytic leukemia) ([place]) 1.  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  Jak 2 and BCR/ABL negative. - He did not have any fevers, night sweats or weight loss in the last 6 months.  No recurrent infections.  No hospitalizations were reported. -We reviewed his blood  work.  White count is more or less stable.  He is not anemic or thrombocytopenic.  Physical examination did not reveal any palpable lymphadenopathy or hepatosplenomegaly. - We will continue to monitor him at 6 monthly intervals.  2.  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  -I will schedule him for a CT of the abdomen and pelvis with oral contrast prior to next visit. - CKD has been stable with creatinine around 3.   3.  Prostate cancer: -He was diagnosed with Gleason 3+4 = 7 prostatic adenocarcinoma on 04/07/2018. -He completed radiation therapy on 08/02/2018.       Orders placed this encounter:  Orders Placed This Encounter  Procedures  . CT Abdomen Pelvis Wo Contrast  . PSA  . CBC with Differential/Platelet  . Comprehensive metabolic panel  . Lactate dehydrogenase      Keith Jack, MD [place] [phone]

## 2018-11-27 DIAGNOSIS — E559 Vitamin D deficiency, unspecified: Secondary | ICD-10-CM | POA: Diagnosis not present

## 2018-11-27 DIAGNOSIS — D649 Anemia, unspecified: Secondary | ICD-10-CM | POA: Diagnosis not present

## 2018-11-27 DIAGNOSIS — N184 Chronic kidney disease, stage 4 (severe): Secondary | ICD-10-CM | POA: Diagnosis not present

## 2018-11-27 DIAGNOSIS — I10 Essential (primary) hypertension: Secondary | ICD-10-CM | POA: Diagnosis not present

## 2018-11-27 DIAGNOSIS — R809 Proteinuria, unspecified: Secondary | ICD-10-CM | POA: Diagnosis not present

## 2018-12-03 DIAGNOSIS — R809 Proteinuria, unspecified: Secondary | ICD-10-CM | POA: Diagnosis not present

## 2018-12-03 DIAGNOSIS — E8889 Other specified metabolic disorders: Secondary | ICD-10-CM | POA: Diagnosis not present

## 2018-12-03 DIAGNOSIS — N184 Chronic kidney disease, stage 4 (severe): Secondary | ICD-10-CM | POA: Diagnosis not present

## 2018-12-03 DIAGNOSIS — E870 Hyperosmolality and hypernatremia: Secondary | ICD-10-CM | POA: Diagnosis not present

## 2018-12-09 DIAGNOSIS — C642 Malignant neoplasm of left kidney, except renal pelvis: Secondary | ICD-10-CM | POA: Diagnosis not present

## 2019-03-04 DIAGNOSIS — Z0000 Encounter for general adult medical examination without abnormal findings: Secondary | ICD-10-CM | POA: Diagnosis not present

## 2019-03-09 DIAGNOSIS — N184 Chronic kidney disease, stage 4 (severe): Secondary | ICD-10-CM | POA: Diagnosis not present

## 2019-03-09 DIAGNOSIS — D72829 Elevated white blood cell count, unspecified: Secondary | ICD-10-CM | POA: Diagnosis not present

## 2019-03-09 DIAGNOSIS — E782 Mixed hyperlipidemia: Secondary | ICD-10-CM | POA: Diagnosis not present

## 2019-03-13 DIAGNOSIS — N184 Chronic kidney disease, stage 4 (severe): Secondary | ICD-10-CM | POA: Diagnosis not present

## 2019-03-13 DIAGNOSIS — E782 Mixed hyperlipidemia: Secondary | ICD-10-CM | POA: Diagnosis not present

## 2019-03-13 DIAGNOSIS — C641 Malignant neoplasm of right kidney, except renal pelvis: Secondary | ICD-10-CM | POA: Diagnosis not present

## 2019-03-13 DIAGNOSIS — C9111 Chronic lymphocytic leukemia of B-cell type in remission: Secondary | ICD-10-CM | POA: Diagnosis not present

## 2019-04-02 ENCOUNTER — Other Ambulatory Visit: Payer: Self-pay

## 2019-04-02 ENCOUNTER — Inpatient Hospital Stay (HOSPITAL_COMMUNITY): Payer: Medicare Other | Attending: Hematology

## 2019-04-02 DIAGNOSIS — Z85528 Personal history of other malignant neoplasm of kidney: Secondary | ICD-10-CM

## 2019-04-02 DIAGNOSIS — N189 Chronic kidney disease, unspecified: Secondary | ICD-10-CM | POA: Diagnosis not present

## 2019-04-02 DIAGNOSIS — E785 Hyperlipidemia, unspecified: Secondary | ICD-10-CM | POA: Diagnosis not present

## 2019-04-02 DIAGNOSIS — C9110 Chronic lymphocytic leukemia of B-cell type not having achieved remission: Secondary | ICD-10-CM | POA: Insufficient documentation

## 2019-04-02 DIAGNOSIS — C61 Malignant neoplasm of prostate: Secondary | ICD-10-CM | POA: Insufficient documentation

## 2019-04-02 DIAGNOSIS — Z923 Personal history of irradiation: Secondary | ICD-10-CM | POA: Insufficient documentation

## 2019-04-02 DIAGNOSIS — Z905 Acquired absence of kidney: Secondary | ICD-10-CM | POA: Insufficient documentation

## 2019-04-02 DIAGNOSIS — F17210 Nicotine dependence, cigarettes, uncomplicated: Secondary | ICD-10-CM | POA: Diagnosis not present

## 2019-04-02 DIAGNOSIS — Z79899 Other long term (current) drug therapy: Secondary | ICD-10-CM | POA: Insufficient documentation

## 2019-04-02 LAB — CBC WITH DIFFERENTIAL/PLATELET
Abs Immature Granulocytes: 0.04 10*3/uL (ref 0.00–0.07)
Basophils Absolute: 0.1 10*3/uL (ref 0.0–0.1)
Basophils Relative: 0 %
Eosinophils Absolute: 0.5 10*3/uL (ref 0.0–0.5)
Eosinophils Relative: 2 %
HCT: 48.1 % (ref 39.0–52.0)
Hemoglobin: 15.9 g/dL (ref 13.0–17.0)
Immature Granulocytes: 0 %
Lymphocytes Relative: 55 %
Lymphs Abs: 13.2 10*3/uL — ABNORMAL HIGH (ref 0.7–4.0)
MCH: 31.5 pg (ref 26.0–34.0)
MCHC: 33.1 g/dL (ref 30.0–36.0)
MCV: 95.4 fL (ref 80.0–100.0)
Monocytes Absolute: 6.3 10*3/uL — ABNORMAL HIGH (ref 0.1–1.0)
Monocytes Relative: 26 %
Neutro Abs: 4 10*3/uL (ref 1.7–7.7)
Neutrophils Relative %: 17 %
Platelets: 145 10*3/uL — ABNORMAL LOW (ref 150–400)
RBC: 5.04 MIL/uL (ref 4.22–5.81)
RDW: 14.5 % (ref 11.5–15.5)
WBC: 24 10*3/uL — ABNORMAL HIGH (ref 4.0–10.5)
nRBC: 0.1 % (ref 0.0–0.2)

## 2019-04-02 LAB — COMPREHENSIVE METABOLIC PANEL
ALT: 19 U/L (ref 0–44)
AST: 18 U/L (ref 15–41)
Albumin: 4.1 g/dL (ref 3.5–5.0)
Alkaline Phosphatase: 70 U/L (ref 38–126)
Anion gap: 9 (ref 5–15)
BUN: 34 mg/dL — ABNORMAL HIGH (ref 8–23)
CO2: 23 mmol/L (ref 22–32)
Calcium: 9.5 mg/dL (ref 8.9–10.3)
Chloride: 110 mmol/L (ref 98–111)
Creatinine, Ser: 3.53 mg/dL — ABNORMAL HIGH (ref 0.61–1.24)
GFR calc Af Amer: 19 mL/min — ABNORMAL LOW (ref >60)
GFR calc non Af Amer: 17 mL/min — ABNORMAL LOW (ref >60)
Glucose, Bld: 101 mg/dL — ABNORMAL HIGH (ref 70–99)
Potassium: 4 mmol/L (ref 3.5–5.1)
Sodium: 142 mmol/L (ref 135–145)
Total Bilirubin: 0.9 mg/dL (ref 0.3–1.2)
Total Protein: 6.9 g/dL (ref 6.5–8.1)

## 2019-04-02 LAB — PSA: Prostatic Specific Antigen: 0.56 ng/mL (ref 0.00–4.00)

## 2019-04-02 LAB — LACTATE DEHYDROGENASE: LDH: 116 U/L (ref 98–192)

## 2019-04-03 ENCOUNTER — Ambulatory Visit (HOSPITAL_COMMUNITY)
Admission: RE | Admit: 2019-04-03 | Discharge: 2019-04-03 | Disposition: A | Discharge status: Home / Self Care | Payer: Medicare Other | Source: Ambulatory Visit | Attending: Nurse Practitioner | Admitting: Nurse Practitioner

## 2019-04-03 ENCOUNTER — Other Ambulatory Visit: Payer: Self-pay

## 2019-04-03 DIAGNOSIS — Z85528 Personal history of other malignant neoplasm of kidney: Secondary | ICD-10-CM | POA: Diagnosis not present

## 2019-04-03 DIAGNOSIS — C641 Malignant neoplasm of right kidney, except renal pelvis: Secondary | ICD-10-CM | POA: Diagnosis not present

## 2019-04-09 ENCOUNTER — Other Ambulatory Visit: Payer: Self-pay

## 2019-04-10 ENCOUNTER — Inpatient Hospital Stay (HOSPITAL_COMMUNITY): Payer: Medicare Other | Admitting: Nurse Practitioner

## 2019-04-10 DIAGNOSIS — Z905 Acquired absence of kidney: Secondary | ICD-10-CM

## 2019-04-10 DIAGNOSIS — C9110 Chronic lymphocytic leukemia of B-cell type not having achieved remission: Secondary | ICD-10-CM

## 2019-04-10 DIAGNOSIS — C61 Malignant neoplasm of prostate: Secondary | ICD-10-CM | POA: Diagnosis not present

## 2019-04-10 DIAGNOSIS — F17210 Nicotine dependence, cigarettes, uncomplicated: Secondary | ICD-10-CM

## 2019-04-10 DIAGNOSIS — Z923 Personal history of irradiation: Secondary | ICD-10-CM | POA: Diagnosis not present

## 2019-04-10 DIAGNOSIS — E785 Hyperlipidemia, unspecified: Secondary | ICD-10-CM

## 2019-04-10 DIAGNOSIS — N189 Chronic kidney disease, unspecified: Secondary | ICD-10-CM | POA: Diagnosis not present

## 2019-04-10 DIAGNOSIS — Z79899 Other long term (current) drug therapy: Secondary | ICD-10-CM | POA: Diagnosis not present

## 2019-04-10 DIAGNOSIS — Z85528 Personal history of other malignant neoplasm of kidney: Secondary | ICD-10-CM

## 2019-04-10 NOTE — Patient Instructions (Signed)
[facility] at [facility] Discharge Instructions  Follow up in 6 months with labs    Thank you for choosing [facility] at [facility] to provide your oncology and hematology care.  To afford each patient quality time with our provider, please arrive at least 15 minutes before your scheduled appointment time.   If you have a lab appointment with the Cancer Center please come in thru the  Main Entrance and check in at the main information desk  You need to re-schedule your appointment should you arrive 10 or more minutes late.  We strive to give you quality time with our providers, and arriving late affects you and other patients whose appointments are after yours.  Also, if you no show three or more times for appointments you may be dismissed from the clinic at the providers discretion.     Again, thank you for choosing [facility].  Our hope is that these requests will decrease the amount of time that you wait before being seen by our physicians.       _____________________________________________________________  Should you have questions after your visit to [facility], please contact our office at [phone] between the hours of 8:00 a.m. and 4:30 p.m.  Voicemails left after 4:00 p.m. will not be returned until the following business day.  For prescription refill requests, have your pharmacy contact our office and allow 72 hours.    Cancer Center Support Programs:   > Cancer Support Group  2nd Tuesday of the month 1pm-2pm, Journey Room    

## 2019-04-10 NOTE — Assessment & Plan Note (Addendum)
1. Stage 0 CLL: - Diagnosed by peripheral blood flow cytometry on 03/07/2016 showing CD5 and CD23 positive B-cell population. - Positive for CALR mutation, which can be positive in the ET and PMF.  Jak 2 and BCR/ABL negative. - He does not have any fevers, night sweats, chills, or unexplained weight loss in the past 6 months.  No recurrent infections or hospitalizations reported. - Physical examination did not reveal any palpable lymphadenopathy or hepatosplenomegaly. -Labs on 04/02/2019 showed his WBC at 24.0 which is stable.  His hemoglobin is 15.9 and his platelets are 145. - He will follow-up in 6 months with repeat labs.  2.  Stage I papillary renal cell carcinoma: - Right nephrectomy was done on 02/18/2015, 2.2 cm papillary RCC, limited to the kidney, margins negative. - CT of the abdomen and pelvis done on 04/03/2019 showed no acute findings.  No evidence of recurrent or metastatic carcinoma within the abdomen or pelvis.  Several left renal lesions cannot be characterized, but are stable since 2017.  Stable diffuse bladder wall thickening, likely due to prior radiation therapy.  We reviewed the results with this patient today. - CKD is been stable with creatinine around 3.  3.  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 -He completed radiation therapy on 08/02/2018. -He is being followed by his urologist that he sees in August.

## 2019-04-10 NOTE — Progress Notes (Signed)
[place], [place] [address]   CLINIC:  Medical Oncology/Hematology  PCP:  Celene Squibb, MD [address] [place] [address] [phone]   REASON FOR VISIT: Follow-up for stage I CLL, prostate cancer, renal cell carcinoma  CURRENT THERAPY: Observation   INTERVAL HISTORY:  Keith Rivera 68 y.o. male returns for routine follow-up for CLL, prostate cancer, and renal cell carcinoma.  He has been doing well since his last visit.  He has no complaints today.  He does get a little fatigued in the evenings. Denies any nausea, vomiting, or diarrhea. Denies any new pains. Had not noticed any recent bleeding such as epistaxis, hematuria or hematochezia. Denies recent chest pain on exertion, shortness of breath on minimal exertion, pre-syncopal episodes, or palpitations. Denies any numbness or tingling in hands or feet. Denies any recent fevers, infections, or recent hospitalizations. Patient reports appetite at 100% and energy level at 75%.  He is eating well maintaining his weight at this time.    REVIEW OF SYSTEMS:  Review of Systems  All other systems reviewed and are negative.    PAST MEDICAL/SURGICAL HISTORY:  Past Medical History:  Diagnosis Date   Anemia    BPH (benign prostatic hyperplasia)    Chronic kidney disease    History of hydronephrosis    Hyperlipidemia    MVP (mitral valve prolapse)    Mild mitral regurgitation 2014   Prostate cancer [facility])    Past Surgical History:  Procedure Laterality Date   CATARACT EXTRACTION W/PHACO Right 11/28/2015   Procedure: CATARACT EXTRACTION PHACO AND INTRAOCULAR LENS PLACEMENT ([facility]);  Surgeon: Williams Che, MD;  Location: [facility] OR[facility];  Service: Ophthalmology;  Laterality: Right;  CDE: 7.14   COLONOSCOPY N/A 02/08/2015   Procedure: COLONOSCOPY;  Surgeon: Daneil Dolin, MD;  Location[facility];  Service: Endoscopy;  Laterality: N/A;  200   COLONOSCOPY N/A 02/26/2018   Procedure:  COLONOSCOPY;  Surgeon: Daneil Dolin, MD;  Location[facility];  Service: Endoscopy;  Laterality: N/A;  1:00   CYSTOSCOPY WITH RETROGRADE PYELOGRAM, URETEROSCOPY AND STENT PLACEMENT Bilateral 12/03/2014   Procedure: CYSTOSCOPY WITH BILATERAL RETROGRADE PYELOGRAM, BILATERAL  URETEROSCOPY ;  Surgeon: Claybon Jabs, MD;  Location: [facility];  Service: Urology;  Laterality: Bilateral;   INGUINAL HERNIA REPAIR Left    LAPAROSCOPIC NEPHRECTOMY Right 02/18/2015   Procedure: LAPAROSCOPIC RIGHT RADICAL NEPHRECTOMY;  Surgeon: Ardis Hughs, MD;  Location: [facility];  Service: Urology;  Laterality: Right;   NEPHROSTOMY  2006   DUE TO ENLARGED PROSTATE   POLYPECTOMY  02/26/2018   Procedure: POLYPECTOMY;  Surgeon: Daneil Dolin, MD;  Location[facility];  Service: Endoscopy;;  ascending colon   TONSILLECTOMY     TRANSURETHRAL RESECTION OF PROSTATE     VASECTOMY       SOCIAL HISTORY:  Social History   Socioeconomic History   Marital status: Married    Spouse name: Not on file   Number of children: 0   Years of education: Not on file   Highest education level: Not on file  Occupational History   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+  Social Designer, fashion/clothing strain: Not hard at all   Food insecurity    Worry: Never true    Inability: Never true   Transportation needs    Medical: No    Non-medical: No  Tobacco Use   Smoking status:  Current Every Day Smoker    Packs/day: 1.00    Years: 40.00    Pack years: 40.00    Types: Cigarettes    Start date: 06/29/1971   Smokeless tobacco: Never Used  Substance and Sexual Activity   Alcohol use: Yes    Alcohol/week: 0.0 standard drinks    Comment: Rare   Drug use: No   Sexual activity: Never    Birth control/protection: None  Lifestyle   Physical activity    Days per week: 7 days    Minutes per session: 30 min   Stress: Not at all  Relationships   Social connections     Talks on phone: Never    Gets together: Never    Attends religious service: Never    Active member of club or organization: Yes    Attends meetings of clubs or organizations: More than 4 times per year    Relationship status: Married   Intimate partner violence    Fear of current or ex partner: No    Emotionally abused: No    Physically abused: No    Forced sexual activity: No  Other Topics Concern   Not on file  Social History Narrative   Not on file    FAMILY HISTORY:  Family History  Problem Relation Age of Onset   Heart attack Father        Died age 19   Colon cancer Maternal Grandfather        In his 60s    CURRENT MEDICATIONS:  Outpatient Encounter Medications as of 04/10/2019  Medication Sig   atorvastatin (LIPITOR) 20 MG tablet Take 20 mg by mouth daily.    Beta Carotene (VITAMIN A) 25000 UNIT capsule Take 25,000 Units by mouth daily.   cholecalciferol (VITAMIN D) 1000 UNITS tablet Take 1,000 Units by mouth every morning. For kidney function   Coenzyme Q10 (CO Q10 PO) Take 1 tablet by mouth at bedtime.    Cyanocobalamin (VITAMIN B-12 PO) Take 1 tablet by mouth daily.   Multiple Vitamin (MULTIVITAMIN WITH MINERALS) TABS tablet Take 1 tablet by mouth every morning.    sodium bicarbonate 650 MG tablet Take 650 mg by mouth 4 (four) times daily.    tamsulosin (FLOMAX) 0.4 MG CAPS capsule Take 0.8 mg by mouth.   No facility-administered encounter medications on file as of 04/10/2019.     ALLERGIES:  Not on File   PHYSICAL EXAM:  ECOG Performance status: 1  Vitals:   04/10/19 0950  BP: 135/82  Pulse: 70  Resp: 18  Temp: 98.6 F (37 C)  SpO2: 96%   Filed Weights   04/10/19 0950  Weight: 167 lb 9.6 oz (76 kg)    Physical Exam Constitutional:      Appearance: Normal appearance. He is normal weight.  Cardiovascular:     Rate and Rhythm: Normal rate and regular rhythm.     Heart sounds: Normal heart sounds.  Pulmonary:     Effort: Pulmonary  effort is normal.     Breath sounds: Normal breath sounds.  Abdominal:     General: Bowel sounds are normal.     Palpations: Abdomen is soft.  Musculoskeletal: Normal range of motion.  Skin:    General: Skin is warm and dry.  Neurological:     Mental Status: He is alert and oriented to person, place, and time. Mental status is at baseline.  Psychiatric:        Mood and Affect: Mood normal.  Behavior: Behavior normal.        Thought Content: Thought content normal.        Judgment: Judgment normal.      LABORATORY DATA:  I have reviewed the labs as listed.  CBC    Component Value Date/Time   WBC 24.0 (H) 04/02/2019 0850   RBC 5.04 04/02/2019 0850   HGB 15.9 04/02/2019 0850   HCT 48.1 04/02/2019 0850   PLT 145 (L) 04/02/2019 0850   MCV 95.4 04/02/2019 0850   MCH 31.5 04/02/2019 0850   MCHC 33.1 04/02/2019 0850   RDW 14.5 04/02/2019 0850   LYMPHSABS 13.2 (H) 04/02/2019 0850   MONOABS 6.3 (H) 04/02/2019 0850   EOSABS 0.5 04/02/2019 0850   BASOSABS 0.1 04/02/2019 0850   CMP Latest Ref Rng & Units 04/02/2019 09/24/2018 03/26/2018  Glucose 70 - 99 mg/dL 101(H) 91 120(H)  BUN 8 - 23 mg/dL 34(H) 30(H) 27(H)  Creatinine 0.61 - 1.24 mg/dL 3.53(H) 3.34(H) 3.16(H)  Sodium 135 - 145 mmol/L 142 139 141  Potassium 3.5 - 5.1 mmol/L 4.0 4.3 4.0  Chloride 98 - 111 mmol/L 110 108 107  CO2 22 - 32 mmol/L 23 25 24   Calcium 8.9 - 10.3 mg/dL 9.5 9.6 9.5  Total Protein 6.5 - 8.1 g/dL 6.9 7.3 6.7  Total Bilirubin 0.3 - 1.2 mg/dL 0.9 0.9 0.9  Alkaline Phos 38 - 126 U/L 70 67 66  AST 15 - 41 U/L 18 22 23   ALT 0 - 44 U/L 19 23 19     DIAGNOSTIC IMAGING:  I have independently reviewed the CT scans and discussed with the patient.  I personally performed a face-to-face visit.  All questions were answered to patient's stated satisfaction. Encouraged patient to call with any new concerns or questions before his next visit to the cancer center and we can certain see him sooner, if needed.      ASSESSMENT & PLAN:   CLL (chronic lymphocytic leukemia) ([facility]) 1. Stage 0 CLL: - Diagnosed by peripheral blood flow cytometry on 03/07/2016 showing CD5 and CD23 positive B-cell population. - Positive for CALR mutation, which can be positive in the ET and PMF.  Jak 2 and BCR/ABL negative. - He does not have any fevers, night sweats, chills, or unexplained weight loss in the past 6 months.  No recurrent infections or hospitalizations reported. - Physical examination did not reveal any palpable lymphadenopathy or hepatosplenomegaly. -Labs on 04/02/2019 showed his WBC at 24.0 which is stable.  His hemoglobin is 15.9 and his platelets are 145. - He will follow-up in 6 months with repeat labs.  2.  Stage I papillary renal cell carcinoma: - Right nephrectomy was done on 02/18/2015, 2.2 cm papillary RCC, limited to the kidney, margins negative. - CT of the abdomen and pelvis done on 04/03/2019 showed no acute findings.  No evidence of recurrent or metastatic carcinoma within the abdomen or pelvis.  Several left renal lesions cannot be characterized, but are stable since 2017.  Stable diffuse bladder wall thickening, likely due to prior radiation therapy.  We reviewed the results with this patient today. - CKD is been stable with creatinine around 3.  3.  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 -He completed radiation therapy on 08/02/2018. -He is being followed by his urologist that he sees in August.      Orders placed this encounter:  Orders Placed This Encounter  Procedures   Lactate dehydrogenase   PSA  CBC with Differential/Platelet   Comprehensive metabolic panel      Francene Finders, FNP-C [place] [phone]

## 2019-06-10 DIAGNOSIS — N289 Disorder of kidney and ureter, unspecified: Secondary | ICD-10-CM | POA: Diagnosis not present

## 2019-06-10 DIAGNOSIS — C642 Malignant neoplasm of left kidney, except renal pelvis: Secondary | ICD-10-CM | POA: Diagnosis not present

## 2019-06-10 DIAGNOSIS — K8020 Calculus of gallbladder without cholecystitis without obstruction: Secondary | ICD-10-CM | POA: Diagnosis not present

## 2019-06-10 DIAGNOSIS — C649 Malignant neoplasm of unspecified kidney, except renal pelvis: Secondary | ICD-10-CM | POA: Diagnosis not present

## 2019-06-15 DIAGNOSIS — C642 Malignant neoplasm of left kidney, except renal pelvis: Secondary | ICD-10-CM | POA: Diagnosis not present

## 2019-07-15 DIAGNOSIS — R21 Rash and other nonspecific skin eruption: Secondary | ICD-10-CM | POA: Diagnosis not present

## 2019-08-11 DIAGNOSIS — L821 Other seborrheic keratosis: Secondary | ICD-10-CM | POA: Diagnosis not present

## 2019-08-11 DIAGNOSIS — D2261 Melanocytic nevi of right upper limb, including shoulder: Secondary | ICD-10-CM | POA: Diagnosis not present

## 2019-08-11 DIAGNOSIS — L570 Actinic keratosis: Secondary | ICD-10-CM | POA: Diagnosis not present

## 2019-08-11 DIAGNOSIS — D485 Neoplasm of uncertain behavior of skin: Secondary | ICD-10-CM | POA: Diagnosis not present

## 2019-09-21 DIAGNOSIS — E782 Mixed hyperlipidemia: Secondary | ICD-10-CM | POA: Diagnosis not present

## 2019-09-21 DIAGNOSIS — N184 Chronic kidney disease, stage 4 (severe): Secondary | ICD-10-CM | POA: Diagnosis not present

## 2019-09-21 DIAGNOSIS — Z1329 Encounter for screening for other suspected endocrine disorder: Secondary | ICD-10-CM | POA: Diagnosis not present

## 2019-09-22 DIAGNOSIS — E782 Mixed hyperlipidemia: Secondary | ICD-10-CM | POA: Diagnosis not present

## 2019-09-22 DIAGNOSIS — J342 Deviated nasal septum: Secondary | ICD-10-CM | POA: Diagnosis not present

## 2019-09-22 DIAGNOSIS — C641 Malignant neoplasm of right kidney, except renal pelvis: Secondary | ICD-10-CM | POA: Diagnosis not present

## 2019-09-22 DIAGNOSIS — Z0001 Encounter for general adult medical examination with abnormal findings: Secondary | ICD-10-CM | POA: Diagnosis not present

## 2019-09-22 DIAGNOSIS — J310 Chronic rhinitis: Secondary | ICD-10-CM | POA: Diagnosis not present

## 2019-09-22 DIAGNOSIS — N184 Chronic kidney disease, stage 4 (severe): Secondary | ICD-10-CM | POA: Diagnosis not present

## 2019-09-22 DIAGNOSIS — J343 Hypertrophy of nasal turbinates: Secondary | ICD-10-CM | POA: Diagnosis not present

## 2019-09-22 DIAGNOSIS — H6122 Impacted cerumen, left ear: Secondary | ICD-10-CM | POA: Diagnosis not present

## 2019-10-08 DIAGNOSIS — Z79899 Other long term (current) drug therapy: Secondary | ICD-10-CM | POA: Diagnosis not present

## 2019-10-08 DIAGNOSIS — E559 Vitamin D deficiency, unspecified: Secondary | ICD-10-CM | POA: Diagnosis not present

## 2019-10-08 DIAGNOSIS — N184 Chronic kidney disease, stage 4 (severe): Secondary | ICD-10-CM | POA: Diagnosis not present

## 2019-10-08 DIAGNOSIS — R809 Proteinuria, unspecified: Secondary | ICD-10-CM | POA: Diagnosis not present

## 2019-10-08 DIAGNOSIS — D631 Anemia in chronic kidney disease: Secondary | ICD-10-CM | POA: Diagnosis not present

## 2019-10-12 ENCOUNTER — Inpatient Hospital Stay (HOSPITAL_COMMUNITY): Payer: Medicare Other | Attending: Hematology

## 2019-10-12 ENCOUNTER — Other Ambulatory Visit: Payer: Self-pay

## 2019-10-12 DIAGNOSIS — Z800 Family history of malignant neoplasm of digestive organs: Secondary | ICD-10-CM | POA: Insufficient documentation

## 2019-10-12 DIAGNOSIS — Z79899 Other long term (current) drug therapy: Secondary | ICD-10-CM | POA: Diagnosis not present

## 2019-10-12 DIAGNOSIS — N189 Chronic kidney disease, unspecified: Secondary | ICD-10-CM | POA: Insufficient documentation

## 2019-10-12 DIAGNOSIS — C9110 Chronic lymphocytic leukemia of B-cell type not having achieved remission: Secondary | ICD-10-CM | POA: Diagnosis not present

## 2019-10-12 DIAGNOSIS — Z8249 Family history of ischemic heart disease and other diseases of the circulatory system: Secondary | ICD-10-CM | POA: Diagnosis not present

## 2019-10-12 DIAGNOSIS — F17210 Nicotine dependence, cigarettes, uncomplicated: Secondary | ICD-10-CM | POA: Insufficient documentation

## 2019-10-12 DIAGNOSIS — C61 Malignant neoplasm of prostate: Secondary | ICD-10-CM | POA: Diagnosis not present

## 2019-10-12 DIAGNOSIS — Z905 Acquired absence of kidney: Secondary | ICD-10-CM | POA: Diagnosis not present

## 2019-10-12 DIAGNOSIS — E785 Hyperlipidemia, unspecified: Secondary | ICD-10-CM | POA: Insufficient documentation

## 2019-10-12 DIAGNOSIS — Z923 Personal history of irradiation: Secondary | ICD-10-CM | POA: Insufficient documentation

## 2019-10-12 LAB — LACTATE DEHYDROGENASE: LDH: 122 U/L (ref 98–192)

## 2019-10-12 LAB — PSA: Prostatic Specific Antigen: 0.35 ng/mL (ref 0.00–4.00)

## 2019-10-13 ENCOUNTER — Encounter (HOSPITAL_COMMUNITY): Payer: Self-pay | Admitting: Nurse Practitioner

## 2019-10-13 ENCOUNTER — Inpatient Hospital Stay (HOSPITAL_COMMUNITY): Payer: Medicare Other | Admitting: Nurse Practitioner

## 2019-10-13 ENCOUNTER — Other Ambulatory Visit: Payer: Self-pay

## 2019-10-13 VITALS — BP 132/88 | HR 87 | Temp 97.7°F | Resp 16 | Wt 171.0 lb

## 2019-10-13 DIAGNOSIS — Z923 Personal history of irradiation: Secondary | ICD-10-CM | POA: Diagnosis not present

## 2019-10-13 DIAGNOSIS — C9110 Chronic lymphocytic leukemia of B-cell type not having achieved remission: Secondary | ICD-10-CM | POA: Diagnosis not present

## 2019-10-13 DIAGNOSIS — Z8249 Family history of ischemic heart disease and other diseases of the circulatory system: Secondary | ICD-10-CM | POA: Diagnosis not present

## 2019-10-13 DIAGNOSIS — N189 Chronic kidney disease, unspecified: Secondary | ICD-10-CM | POA: Diagnosis not present

## 2019-10-13 DIAGNOSIS — C61 Malignant neoplasm of prostate: Secondary | ICD-10-CM | POA: Diagnosis not present

## 2019-10-13 DIAGNOSIS — E785 Hyperlipidemia, unspecified: Secondary | ICD-10-CM | POA: Diagnosis not present

## 2019-10-13 DIAGNOSIS — Z905 Acquired absence of kidney: Secondary | ICD-10-CM | POA: Diagnosis not present

## 2019-10-13 DIAGNOSIS — Z85528 Personal history of other malignant neoplasm of kidney: Secondary | ICD-10-CM

## 2019-10-13 DIAGNOSIS — Z800 Family history of malignant neoplasm of digestive organs: Secondary | ICD-10-CM | POA: Diagnosis not present

## 2019-10-13 DIAGNOSIS — Z79899 Other long term (current) drug therapy: Secondary | ICD-10-CM | POA: Diagnosis not present

## 2019-10-13 NOTE — Assessment & Plan Note (Addendum)
1. Stage 0 CLL: - Diagnosed by peripheral blood flow cytometry on 03/07/2016 showing CD5 and CD23 positive B-cell population. - Positive for CALR mutation, which can be positive in the ET and PMF.  Jak 2 and BCR/ABL negative. - He does not have any fevers, night sweats, chills, or unexplained weight loss in the past 6 months.  No recurrent infections or hospitalizations reported. - Physical examination did not reveal any palpable lymphadenopathy or hepatosplenomegaly. -Labs on 09/21/2019 showed his WBC at 26.3 which is stable.  His hemoglobin is 16.4 and his platelets are 133. - He will follow-up in 6 months with repeat labs.  2.  Stage I papillary renal cell carcinoma: - Right nephrectomy was done on 02/18/2015, 2.2 cm papillary RCC, limited to the kidney, margins negative. - CT of the abdomen and pelvis done on 04/03/2019 showed no acute findings.  No evidence of recurrent or metastatic carcinoma within the abdomen or pelvis.  Several left renal lesions cannot be characterized, but are stable since 2017.  Stable diffuse bladder wall thickening, likely due to prior radiation therapy.   - CKD is been stable with creatinine around 3. -We will repeat his CT [facility] prior to his next visit.  3.  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 -He completed radiation therapy on 08/02/2018. -He is being followed by his urologist that he sees in anywhere. -Labs done on 10/12/2019 showed his PSA level was 0.35

## 2019-10-13 NOTE — Patient Instructions (Signed)
[facility] at [facility] Discharge Instructions  Follow up in 6 months with labs    Thank you for choosing [facility] at [facility] to provide your oncology and hematology care.  To afford each patient quality time with our provider, please arrive at least 15 minutes before your scheduled appointment time.   If you have a lab appointment with the Cancer Center please come in thru the Main Entrance and check in at the main information desk.  You need to re-schedule your appointment should you arrive 10 or more minutes late.  We strive to give you quality time with our providers, and arriving late affects you and other patients whose appointments are after yours.  Also, if you no show three or more times for appointments you may be dismissed from the clinic at the providers discretion.     Again, thank you for choosing [facility].  Our hope is that these requests will decrease the amount of time that you wait before being seen by our physicians.       _____________________________________________________________  Should you have questions after your visit to  Cancer Center, please contact our office at [phone] between the hours of 8:00 a.m. and 4:30 p.m.  Voicemails left after 4:00 p.m. will not be returned until the following business day.  For prescription refill requests, have your pharmacy contact our office and allow 72 hours.    Due to Covid, you will need to wear a mask upon entering the hospital. If you do not have a mask, a mask will be given to you at the Main Entrance upon arrival. For doctor visits, patients may have 1 support person with them. For treatment visits, patients can not have anyone with them due to social distancing guidelines and our immunocompromised population.      

## 2019-10-13 NOTE — Progress Notes (Signed)
[place], [place] [address]   CLINIC:  Medical Oncology/Hematology  PCP:  Celene Squibb, MD [address] [place] [address] [phone]   REASON FOR VISIT: Follow-up for CLL and newly diagnosed prostate cancer  CURRENT THERAPY: Observation   INTERVAL HISTORY:  Keith Rivera 68 y.o. male returns for routine follow-up for CLL and newly diagnosed prostate cancer.  Patient report has been doing well since last visit.  He denies any fevers, chills, unexplained weight loss or night sweats.  He denies any extreme fatigue or easy bruising or bleeding. Denies any nausea, vomiting, or diarrhea. Denies any new pains. Had not noticed any recent bleeding such as epistaxis, hematuria or hematochezia. Denies recent chest pain on exertion, shortness of breath on minimal exertion, pre-syncopal episodes, or palpitations. Denies any numbness or tingling in hands or feet. Denies any recent fevers, infections, or recent hospitalizations. Patient reports appetite at 100% and energy level at 75%.  He is eating well maintain his weight at this time.    REVIEW OF SYSTEMS:  Review of Systems  All other systems reviewed and are negative.    PAST MEDICAL/SURGICAL HISTORY:  Past Medical History:  Diagnosis Date  . Anemia   . BPH (benign prostatic hyperplasia)   . Chronic kidney disease   . History of hydronephrosis   . Hyperlipidemia   . MVP (mitral valve prolapse)    Mild mitral regurgitation 2014  . Prostate cancer [facility])    Past Surgical History:  Procedure Laterality Date  . CATARACT EXTRACTION W/PHACO Right 11/28/2015   Procedure: CATARACT EXTRACTION PHACO AND INTRAOCULAR LENS PLACEMENT (I[facility]);  Surgeon: Williams Che, MD;  Location: [facility] ORS;  Service: Ophthalmology;  Laterality: Right;  CDE: 7.14  . COLONOSCOPY N/A 02/08/2015   Procedure: COLONOSCOPY;  Surgeon: Daneil Dolin, MD;  Location: [facility] ENDO SUITE;  Service: Endoscopy;  Laterality: N/A;  200   . COLONOSCOPY N/A 02/26/2018   Procedure: COLONOSCOPY;  Surgeon: Daneil Dolin, MD;  Location: [facility] END[facility] SUITE;  Service: Endoscopy;  Laterality: N/A;  1:00  . CYSTOSCOPY WITH RETROGRADE PYELOGRAM, URETEROSCOPY AND STENT PLACEMENT Bilateral 12/03/2014   Procedure: CYSTOSCOPY WITH BILATERAL RETROGRADE PYELOGRAM, BILATERAL  URETEROSCOPY ;  Surgeon: Claybon Jabs, MD;  Location: [facility] ORS;  Service: Urology;  Laterality: Bilateral;  . INGUINAL HERNIA REPAIR Left   . LAPAROSCOPIC NEPHRECTOMY Right 02/18/2015   Procedure: LAPAROSCOPIC RIGHT RADICAL NEPHRECTOMY;  Surgeon: Ardis Hughs, MD;  Location: [facility] ORS;  Service: Urology;  Laterality: Right;  . NEPHROSTOMY  2006   DUE TO ENLARGED PROSTATE  . POLYPECTOMY  02/26/2018   Procedure: POLYPECTOMY;  Surgeon: Daneil Dolin, MD;  Location: [facility] ENDO SUITE;  Service: Endoscopy;;  ascending colon  . TONSILLECTOMY    . TRANSURETHRAL RESECTION OF PROSTATE    . VASECTOMY       SOCIAL HISTORY:  Social History   Socioeconomic History  . Marital status: Married    Spouse name: Not on file  . Number of children: 0  . Years of education: Not on file  . Highest education level: Not on file  Occupational History  . 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 Smoking status: Current Every Day Smoker    Packs/day: 1.00    Years: 40.00    Pack years: 40.00    Types: Cigarettes    Start date: 06/29/1971  . Smokeless tobacco: Never Used  Substance and Sexual Activity  . Alcohol use: Yes    Alcohol/week: 0.0 standard drinks    Comment: Rare  . Drug use: No  . Sexual activity: Never    Birth control/protection: None  Other Topics Concern  . Not on file  Social History Narrative  . Not on file   Social Determinants of Health   Financial Resource Strain:   . Difficulty of Paying Living Expenses: Not on file  Food Insecurity:   . Worried About Charity fundraiser in the Last Year: Not on file  . Ran Out of  Food in the Last Year: Not on file  Transportation Needs:   . Lack of Transportation (Medical): Not on file  . Lack of Transportation (Non-Medical): Not on file  Physical Activity:   . Days of Exercise per Week: Not on file  . Minutes of Exercise per Session: Not on file  Stress:   . Feeling of Stress : Not on file  Social Connections:   . Frequency of Communication with Friends and Family: Not on file  . Frequency of Social Gatherings with Friends and Family: Not on file  . Attends Religious Services: Not on file  . Active Member of Clubs or Organizations: Not on file  . Attends Archivist Meetings: Not on file  . Marital Status: Not on file  Intimate Partner Violence:   . Fear of Current or Ex-Partner: Not on file  . Emotionally Abused: Not on file  . Physically Abused: Not on file  . Sexually Abused: Not on file    FAMILY HISTORY:  Family History  Problem Relation Age of Onset  . Heart attack Father        Died age 89  . Colon cancer Maternal Grandfather        In his 30s    CURRENT MEDICATIONS:  Outpatient Encounter Medications as of 10/13/2019  Medication Sig  . atorvastatin (LIPITOR) 20 MG tablet Take 20 mg by mouth daily.   . Beta Carotene (VITAMIN A) 25000 UNIT capsule Take 25,000 Units by mouth daily.  . cholecalciferol (VITAMIN D) 1000 UNITS tablet Take 1,000 Units by mouth every morning. For kidney function  . Coenzyme Q10 (CO Q10 PO) Take 1 tablet by mouth at bedtime.   . Cyanocobalamin (VITAMIN B-12 PO) Take 1 tablet by mouth daily.  . Multiple Vitamin (MULTIVITAMIN WITH MINERALS) TABS tablet Take 1 tablet by mouth every morning.   . mupirocin ointment (BACTROBAN) 2 % APPLY OINTMENT EXTERNALLY TO NOSTRIL THREE TIMES DAILY AS NEEDED FOR NOSE PAIN  . sodium bicarbonate 650 MG tablet Take 650 mg by mouth 4 (four) times daily.   . tamsulosin (FLOMAX) 0.4 MG CAPS capsule Take 0.8 mg by mouth.   No facility-administered encounter medications on file as of  10/13/2019.    ALLERGIES:  Not on File   PHYSICAL EXAM:  ECOG Performance status: 1  Vitals:   10/13/19 1100  BP: 132/88  Pulse: 87  Resp: 16  Temp: 97.7 F (36.5 C)  SpO2: 100%   Filed Weights   10/13/19 1100  Weight: 171 lb (77.6 kg)    Physical Exam Constitutional:      Appearance: Normal appearance. He is normal weight.  Cardiovascular:     Rate and Rhythm: Normal rate and regular rhythm.     Heart sounds: Normal heart sounds.  Pulmonary:     Effort: Pulmonary effort is normal.     Breath sounds: Normal breath sounds.  Abdominal:  General: Bowel sounds are normal.     Palpations: Abdomen is soft.  Musculoskeletal:        General: Normal range of motion.  Skin:    General: Skin is warm.  Neurological:     Mental Status: He is alert and oriented to person, place, and time. Mental status is at baseline.  Psychiatric:        Mood and Affect: Mood normal.        Behavior: Behavior normal.        Thought Content: Thought content normal.        Judgment: Judgment normal.      LABORATORY DATA:  I have reviewed the labs as listed.  CBC    Component Value Date/Time   WBC 24.0 (H) 04/02/2019 0850   RBC 5.04 04/02/2019 0850   HGB 15.9 04/02/2019 0850   HCT 48.1 04/02/2019 0850   PLT 145 (L) 04/02/2019 0850   MCV 95.4 04/02/2019 0850   MCH 31.5 04/02/2019 0850   MCHC 33.1 04/02/2019 0850   RDW 14.5 04/02/2019 0850   LYMPHSABS 13.2 (H) 04/02/2019 0850   MONOABS 6.3 (H) 04/02/2019 0850   EOSABS 0.5 04/02/2019 0850   BASOSABS 0.1 04/02/2019 0850   CMP Latest Ref Rng & Units 04/02/2019 09/24/2018 03/26/2018  Glucose 70 - 99 mg/dL 101(H) 91 120(H)  BUN 8 - 23 mg/dL 34(H) 30(H) 27(H)  Creatinine 0.61 - 1.24 mg/dL 3.53(H) 3.34(H) 3.16(H)  Sodium 135 - 145 mmol/L 142 139 141  Potassium 3.5 - 5.1 mmol/L 4.0 4.3 4.0  Chloride 98 - 111 mmol/L 110 108 107  CO2 22 - 32 mmol/L 23 25 24   Calcium 8.9 - 10.3 mg/dL 9.5 9.6 9.5  Total Protein 6.5 - 8.1 g/dL 6.9 7.3  6.7  Total Bilirubin 0.3 - 1.2 mg/dL 0.9 0.9 0.9  Alkaline Phos 38 - 126 U/L 70 67 66  AST 15 - 41 U/L 18 22 23   ALT 0 - 44 U/L 19 23 19      I personally performed a face-to-face visit.  All questions were answered to patient's stated satisfaction. Encouraged patient to call with any new concerns or questions before his next visit to the cancer center and we can certain see him sooner, if needed.     ASSESSMENT & PLAN:   CLL (chronic lymphocytic leukemia) (HCC) 1. Stage 0 CLL: - Diagnosed by peripheral blood flow cytometry on 03/07/2016 showing CD5 and CD23 positive B-cell population. - Positive for CALR mutation, which can be positive in the ET and PMF.  Jak 2 and BCR/ABL negative. - He does not have any fevers, night sweats, chills, or unexplained weight loss in the past 6 months.  No recurrent infections or hospitalizations reported. - Physical examination did not reveal any palpable lymphadenopathy or hepatosplenomegaly. -Labs on 09/21/2019 showed his WBC at 26.3 which is stable.  His hemoglobin is 16.4 and his platelets are 133. - He will follow-up in 6 months with repeat labs.  2.  Stage I papillary renal cell carcinoma: - Right nephrectomy was done on 02/18/2015, 2.2 cm papillary RCC, limited to the kidney, margins negative. - CT of the abdomen and pelvis done on 04/03/2019 showed no acute findings.  No evidence of recurrent or metastatic carcinoma within the abdomen or pelvis.  Several left renal lesions cannot be characterized, but are stable since 2017.  Stable diffuse bladder wall thickening, likely due to prior radiation therapy.   - CKD is been stable with creatinine around 3. -We  will repeat his CT [facility] prior to his next visit.  3.  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 -He completed radiation therapy on 08/02/2018. -He is being followed by his urologist that he sees in anywhere. -Labs done on 10/12/2019 showed his PSA  level was 0.35      Orders placed this encounter:  Orders Placed This Encounter  Procedures  . CT ABDOMEN PELVIS W CONTRAST  . CBC with Differential/Platelet  . Comprehensive metabolic panel  . Lactate dehydrogenase  . PSA      Francene Finders, FNP-C [place] [phone]

## 2019-10-15 DIAGNOSIS — N184 Chronic kidney disease, stage 4 (severe): Secondary | ICD-10-CM | POA: Diagnosis not present

## 2019-10-15 DIAGNOSIS — R809 Proteinuria, unspecified: Secondary | ICD-10-CM | POA: Diagnosis not present

## 2019-10-15 DIAGNOSIS — E872 Acidosis: Secondary | ICD-10-CM | POA: Diagnosis not present

## 2019-10-15 DIAGNOSIS — E211 Secondary hyperparathyroidism, not elsewhere classified: Secondary | ICD-10-CM | POA: Diagnosis not present

## 2019-10-15 DIAGNOSIS — I129 Hypertensive chronic kidney disease with stage 1 through stage 4 chronic kidney disease, or unspecified chronic kidney disease: Secondary | ICD-10-CM | POA: Diagnosis not present

## 2019-12-21 DIAGNOSIS — C642 Malignant neoplasm of left kidney, except renal pelvis: Secondary | ICD-10-CM | POA: Diagnosis not present

## 2019-12-24 DIAGNOSIS — N184 Chronic kidney disease, stage 4 (severe): Secondary | ICD-10-CM | POA: Diagnosis not present

## 2019-12-24 DIAGNOSIS — E211 Secondary hyperparathyroidism, not elsewhere classified: Secondary | ICD-10-CM | POA: Diagnosis not present

## 2019-12-24 DIAGNOSIS — I129 Hypertensive chronic kidney disease with stage 1 through stage 4 chronic kidney disease, or unspecified chronic kidney disease: Secondary | ICD-10-CM | POA: Diagnosis not present

## 2019-12-24 DIAGNOSIS — R809 Proteinuria, unspecified: Secondary | ICD-10-CM | POA: Diagnosis not present

## 2019-12-25 DIAGNOSIS — N184 Chronic kidney disease, stage 4 (severe): Secondary | ICD-10-CM | POA: Diagnosis not present

## 2019-12-25 DIAGNOSIS — R809 Proteinuria, unspecified: Secondary | ICD-10-CM | POA: Diagnosis not present

## 2019-12-25 DIAGNOSIS — E872 Acidosis: Secondary | ICD-10-CM | POA: Diagnosis not present

## 2019-12-25 DIAGNOSIS — E211 Secondary hyperparathyroidism, not elsewhere classified: Secondary | ICD-10-CM | POA: Diagnosis not present

## 2019-12-25 DIAGNOSIS — E870 Hyperosmolality and hypernatremia: Secondary | ICD-10-CM | POA: Diagnosis not present

## 2020-02-29 DIAGNOSIS — E211 Secondary hyperparathyroidism, not elsewhere classified: Secondary | ICD-10-CM | POA: Diagnosis not present

## 2020-02-29 DIAGNOSIS — E872 Acidosis: Secondary | ICD-10-CM | POA: Diagnosis not present

## 2020-02-29 DIAGNOSIS — R809 Proteinuria, unspecified: Secondary | ICD-10-CM | POA: Diagnosis not present

## 2020-02-29 DIAGNOSIS — N184 Chronic kidney disease, stage 4 (severe): Secondary | ICD-10-CM | POA: Diagnosis not present

## 2020-02-29 DIAGNOSIS — E870 Hyperosmolality and hypernatremia: Secondary | ICD-10-CM | POA: Diagnosis not present

## 2020-03-04 DIAGNOSIS — E872 Acidosis: Secondary | ICD-10-CM | POA: Diagnosis not present

## 2020-03-04 DIAGNOSIS — N184 Chronic kidney disease, stage 4 (severe): Secondary | ICD-10-CM | POA: Diagnosis not present

## 2020-03-04 DIAGNOSIS — R809 Proteinuria, unspecified: Secondary | ICD-10-CM | POA: Diagnosis not present

## 2020-03-04 DIAGNOSIS — E211 Secondary hyperparathyroidism, not elsewhere classified: Secondary | ICD-10-CM | POA: Diagnosis not present

## 2020-03-04 DIAGNOSIS — E870 Hyperosmolality and hypernatremia: Secondary | ICD-10-CM | POA: Diagnosis not present

## 2020-03-18 DIAGNOSIS — C9110 Chronic lymphocytic leukemia of B-cell type not having achieved remission: Secondary | ICD-10-CM | POA: Diagnosis not present

## 2020-03-18 DIAGNOSIS — C9111 Chronic lymphocytic leukemia of B-cell type in remission: Secondary | ICD-10-CM | POA: Diagnosis not present

## 2020-03-18 DIAGNOSIS — D696 Thrombocytopenia, unspecified: Secondary | ICD-10-CM | POA: Diagnosis not present

## 2020-03-18 DIAGNOSIS — D72829 Elevated white blood cell count, unspecified: Secondary | ICD-10-CM | POA: Diagnosis not present

## 2020-03-18 DIAGNOSIS — E782 Mixed hyperlipidemia: Secondary | ICD-10-CM | POA: Diagnosis not present

## 2020-03-18 DIAGNOSIS — C641 Malignant neoplasm of right kidney, except renal pelvis: Secondary | ICD-10-CM | POA: Diagnosis not present

## 2020-03-23 DIAGNOSIS — N184 Chronic kidney disease, stage 4 (severe): Secondary | ICD-10-CM | POA: Diagnosis not present

## 2020-03-23 DIAGNOSIS — Z0001 Encounter for general adult medical examination with abnormal findings: Secondary | ICD-10-CM | POA: Diagnosis not present

## 2020-04-12 ENCOUNTER — Other Ambulatory Visit (HOSPITAL_COMMUNITY): Payer: Self-pay | Admitting: Nurse Practitioner

## 2020-04-12 ENCOUNTER — Inpatient Hospital Stay (HOSPITAL_COMMUNITY): Payer: Medicare Other | Attending: Hematology

## 2020-04-12 ENCOUNTER — Ambulatory Visit (HOSPITAL_COMMUNITY)
Admission: RE | Admit: 2020-04-12 | Discharge: 2020-04-12 | Disposition: A | Discharge status: Home / Self Care | Payer: Medicare Other | Source: Ambulatory Visit | Attending: Nurse Practitioner | Admitting: Nurse Practitioner

## 2020-04-12 ENCOUNTER — Other Ambulatory Visit: Payer: Self-pay

## 2020-04-12 DIAGNOSIS — Z8546 Personal history of malignant neoplasm of prostate: Secondary | ICD-10-CM | POA: Diagnosis not present

## 2020-04-12 DIAGNOSIS — Z905 Acquired absence of kidney: Secondary | ICD-10-CM | POA: Insufficient documentation

## 2020-04-12 DIAGNOSIS — E785 Hyperlipidemia, unspecified: Secondary | ICD-10-CM | POA: Diagnosis not present

## 2020-04-12 DIAGNOSIS — Z800 Family history of malignant neoplasm of digestive organs: Secondary | ICD-10-CM | POA: Diagnosis not present

## 2020-04-12 DIAGNOSIS — Z85528 Personal history of other malignant neoplasm of kidney: Secondary | ICD-10-CM

## 2020-04-12 DIAGNOSIS — F17210 Nicotine dependence, cigarettes, uncomplicated: Secondary | ICD-10-CM | POA: Diagnosis not present

## 2020-04-12 DIAGNOSIS — Z8249 Family history of ischemic heart disease and other diseases of the circulatory system: Secondary | ICD-10-CM | POA: Insufficient documentation

## 2020-04-12 DIAGNOSIS — Z923 Personal history of irradiation: Secondary | ICD-10-CM | POA: Insufficient documentation

## 2020-04-12 DIAGNOSIS — C61 Malignant neoplasm of prostate: Secondary | ICD-10-CM | POA: Diagnosis present

## 2020-04-12 DIAGNOSIS — C9110 Chronic lymphocytic leukemia of B-cell type not having achieved remission: Secondary | ICD-10-CM

## 2020-04-12 LAB — COMPREHENSIVE METABOLIC PANEL
ALT: 21 U/L (ref 0–44)
AST: 19 U/L (ref 15–41)
Albumin: 4.5 g/dL (ref 3.5–5.0)
Alkaline Phosphatase: 75 U/L (ref 38–126)
Anion gap: 12 (ref 5–15)
BUN: 22 mg/dL (ref 8–23)
CO2: 22 mmol/L (ref 22–32)
Calcium: 9.7 mg/dL (ref 8.9–10.3)
Chloride: 109 mmol/L (ref 98–111)
Creatinine, Ser: 3.17 mg/dL — ABNORMAL HIGH (ref 0.61–1.24)
GFR calc Af Amer: 22 mL/min — ABNORMAL LOW (ref >60)
GFR calc non Af Amer: 19 mL/min — ABNORMAL LOW (ref >60)
Glucose, Bld: 98 mg/dL (ref 70–99)
Potassium: 4 mmol/L (ref 3.5–5.1)
Sodium: 143 mmol/L (ref 135–145)
Total Bilirubin: 0.9 mg/dL (ref 0.3–1.2)
Total Protein: 7.3 g/dL (ref 6.5–8.1)

## 2020-04-12 LAB — CBC WITH DIFFERENTIAL/PLATELET
Abs Immature Granulocytes: 0.06 10*3/uL (ref 0.00–0.07)
Basophils Absolute: 0.1 10*3/uL (ref 0.0–0.1)
Basophils Relative: 0 %
Eosinophils Absolute: 0.4 10*3/uL (ref 0.0–0.5)
Eosinophils Relative: 1 %
HCT: 52.5 % — ABNORMAL HIGH (ref 39.0–52.0)
Hemoglobin: 16.9 g/dL (ref 13.0–17.0)
Immature Granulocytes: 0 %
Lymphocytes Relative: 76 %
Lymphs Abs: 24.8 10*3/uL — ABNORMAL HIGH (ref 0.7–4.0)
MCH: 31.3 pg (ref 26.0–34.0)
MCHC: 32.2 g/dL (ref 30.0–36.0)
MCV: 97.2 fL (ref 80.0–100.0)
Monocytes Absolute: 3.6 10*3/uL — ABNORMAL HIGH (ref 0.1–1.0)
Monocytes Relative: 11 %
Neutro Abs: 4.1 10*3/uL (ref 1.7–7.7)
Neutrophils Relative %: 12 %
Platelets: 143 10*3/uL — ABNORMAL LOW (ref 150–400)
RBC: 5.4 MIL/uL (ref 4.22–5.81)
RDW: 15 % (ref 11.5–15.5)
WBC: 32.9 10*3/uL — ABNORMAL HIGH (ref 4.0–10.5)
nRBC: 0 % (ref 0.0–0.2)

## 2020-04-12 LAB — LACTATE DEHYDROGENASE: LDH: 135 U/L (ref 98–192)

## 2020-04-12 LAB — PSA: Prostatic Specific Antigen: 0.32 ng/mL (ref 0.00–4.00)

## 2020-04-19 ENCOUNTER — Inpatient Hospital Stay (HOSPITAL_BASED_OUTPATIENT_CLINIC_OR_DEPARTMENT_OTHER): Payer: Medicare Other | Admitting: Nurse Practitioner

## 2020-04-19 ENCOUNTER — Other Ambulatory Visit: Payer: Self-pay

## 2020-04-19 DIAGNOSIS — C9110 Chronic lymphocytic leukemia of B-cell type not having achieved remission: Secondary | ICD-10-CM | POA: Diagnosis not present

## 2020-04-19 NOTE — Assessment & Plan Note (Signed)
1. Stage 0 CLL: - Diagnosed by peripheral blood flow cytometry on 03/07/2016 showing CD5 and CD23 positive B-cell population. - Positive for CALR mutation, which can be positive in the ET and PMF.  Jak 2 and BCR/ABL negative. - He does not have any fevers, night sweats, chills, or unexplained weight loss in the past 6 months.  No recurrent infections or hospitalizations reported. -Patient denies any B symptoms.  He denies any new adenopathy. -Labs on 04/12/2020 showed WBC 32.9, hemoglobin 16.9, platelets 143, creatinine 3.17 - He will follow-up in 6 months with repeat labs.  2.  Stage I papillary renal cell carcinoma: - Right nephrectomy was done on 02/18/2015, 2.2 cm papillary RCC, limited to the kidney, margins negative. - CT of the abdomen and pelvis done on 04/03/2019 showed no acute findings.  No evidence of recurrent or metastatic carcinoma within the abdomen or pelvis.  Several left renal lesions cannot be characterized, but are stable since 2017.  Stable diffuse bladder wall thickening, likely due to prior radiation therapy.   - CKD is been stable with creatinine around 3. -CTAP done on 04/12/2020 was negative for any metastatic disease.  3.  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 -He completed radiation therapy on 08/02/2018. -He is being followed by his urologist that he sees in anywhere. -Labs done on 04/12/2020 showed PSA 0.32

## 2020-04-19 NOTE — Progress Notes (Signed)
[facility] Follow up:    Celene Squibb, MD [place] [address]   DIAGNOSIS: CLL  CURRENT THERAPY: Observation  INTERVAL HISTORY: Keith Rivera 69 y.o. male was called for telephone visit for CLL.  Patient reports he is doing well since his last visit he denies any B symptoms.  He denies any new pains. Denies any nausea, vomiting, or diarrhea. Denies any new pains. Had not noticed any recent bleeding such as epistaxis, hematuria or hematochezia. Denies recent chest pain on exertion, shortness of breath on minimal exertion, pre-syncopal episodes, or palpitations. Denies any numbness or tingling in hands or feet. Denies any recent fevers, infections, or recent hospitalizations. Patient reports appetite at 100% and energy level at 75%.  He is eating well maintain his weight at this time.    Patient Active Problem List   Diagnosis Date Noted   Malignant neoplasm of prostate ([place]) 05/12/2018   Renal cell carcinoma ([place]) 05/12/2018   CLL (chronic lymphocytic leukemia) ([place]) 03/22/2016   Renal mass, right 02/18/2015   History of colonic polyps    Diverticulosis of colon without hemorrhage    History of adenomatous polyp of colon 01/26/2015   Pre-op testing 01/12/2015   CLOSED FRACTURE OF METATARSAL BONE 06/14/2008    has no allergies on file.  MEDICAL HISTORY: Past Medical History:  Diagnosis Date   Anemia    BPH (benign prostatic hyperplasia)    Chronic kidney disease    History of hydronephrosis    Hyperlipidemia    MVP (mitral valve prolapse)    Mild mitral regurgitation 2014   Prostate cancer [facility])     SURGICAL HISTORY: Past Surgical History:  Procedure Laterality Date   CATARACT EXTRACTION W/PHACO Right 11/28/2015   Procedure: CATARACT EXTRACTION PHACO AND INTRAOCULAR LENS PLACEMENT ([place]);  Surgeon: Williams Che, MD;  Location: [facility] ORS;  Service: Ophthalmology;  Laterality: Right;  CDE: 7.14   COLONOSCOPY  N/A 02/08/2015   Procedure: COLONOSCOPY;  Surgeon: Daneil Dolin, MD;  Location: [facility] ENDO SUITE;  Service: Endoscopy;  Laterality: N/A;  200   COLONOSCOPY N/A 02/26/2018   Procedure: COLONOSCOPY;  Surgeon: Daneil Dolin, MD;  Location: [facility];  Service: Endoscopy;  Laterality: N/A;  1:00   CYSTOSCOPY WITH RETROGRADE PYELOGRAM, URETEROSCOPY AND STENT PLACEMENT Bilateral 12/03/2014   Procedure: CYSTOSCOPY WITH BILATERAL RETROGRADE PYELOGRAM, BILATERAL  URETEROSCOPY ;  Surgeon: Claybon Jabs, MD;  Location: [facility]S;  Service: Urology;  Laterality: Bilateral;   INGUINAL HERNIA REPAIR Left    LAPAROSCOPIC NEPHRECTOMY Right 02/18/2015   Procedure: LAPAROSCOPIC RIGHT RADICAL NEPHRECTOMY;  Surgeon: Ardis Hughs, MD;  Location: [facility];  Service: Urology;  Laterality: Right;   NEPHROSTOMY  2006   DUE TO ENLARGED PROSTATE   POLYPECTOMY  02/26/2018   Procedure: POLYPECTOMY;  Surgeon: Daneil Dolin, MD;  Location: [facility] ENDO SUITE;  Service: Endoscopy;;  ascending colon   TONSILLECTOMY     TRANSURETHRAL RESECTION OF PROSTATE     VASECTOMY      SOCIAL HISTORY: Social History   Socioeconomic History   Marital status: Married    Spouse name: Not on file   Number of children: 0   Years of education: Not on file   Highest education level: Not on file  Occupational History   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+  Tobacco Use   Smoking status: Current Every Day Smoker    Packs/day: 1.00  Years: 40.00    Pack years: 40.00    Types: Cigarettes    Start date: 06/29/1971   Smokeless tobacco: Never Used  Vaping Use   Vaping Use: Never used  Substance and Sexual Activity   Alcohol use: Yes    Alcohol/week: 0.0 standard drinks    Comment: Rare   Drug use: No   Sexual activity: Never    Birth control/protection: None  Other Topics Concern   Not on file  Social History Narrative   Not on file   Social Determinants of Health   Financial  Resource Strain:    Difficulty of Paying Living Expenses:   Food Insecurity:    Worried About Charity fundraiser in the Last Year:    Arboriculturist in the Last Year:   Transportation Needs:    Film/video editor (Medical):    Lack of Transportation (Non-Medical):   Physical Activity:    Days of Exercise per Week:    Minutes of Exercise per Session:   Stress:    Feeling of Stress :   Social Connections:    Frequency of Communication with Friends and Family:    Frequency of Social Gatherings with Friends and Family:    Attends Religious Services:    Active Member of Clubs or Organizations:    Attends Music therapist:    Marital Status:   Intimate Partner Violence:    Fear of Current or Ex-Partner:    Emotionally Abused:    Physically Abused:    Sexually Abused:     FAMILY HISTORY: Family History  Problem Relation Age of Onset   Heart attack Father        Died age 55   Colon cancer Maternal Grandfather        In his 17s    Review of Systems  All other systems reviewed and are negative.   Vital signs: -Deferred to telephone visit  Physical Exam -Deferred due to telephone visit -Patient was alert and oriented over the phone and in no acute distress.   LABORATORY DATA:  CBC    Component Value Date/Time   WBC 32.9 (H) 04/12/2020 0928   RBC 5.40 04/12/2020 0928   HGB 16.9 04/12/2020 0928   HCT 52.5 (H) 04/12/2020 0928   PLT 143 (L) 04/12/2020 0928   MCV 97.2 04/12/2020 0928   MCH 31.3 04/12/2020 0928   MCHC 32.2 04/12/2020 0928   RDW 15.0 04/12/2020 0928   LYMPHSABS 24.8 (H) 04/12/2020 0928   MONOABS 3.6 (H) 04/12/2020 0928   EOSABS 0.4 04/12/2020 0928   BASOSABS 0.1 04/12/2020 0928    CMP     Component Value Date/Time   NA 143 04/12/2020 0928   K 4.0 04/12/2020 0928   CL 109 04/12/2020 0928   CO2 22 04/12/2020 0928   GLUCOSE 98 04/12/2020 0928   BUN 22 04/12/2020 0928   CREATININE 3.17 (H) 04/12/2020 0928    CALCIUM 9.7 04/12/2020 0928   PROT 7.3 04/12/2020 0928   ALBUMIN 4.5 04/12/2020 0928   AST 19 04/12/2020 0928   ALT 21 04/12/2020 0928   ALKPHOS 75 04/12/2020 0928   BILITOT 0.9 04/12/2020 0928   GFRNONAA 19 (L) 04/12/2020 0928   GFRAA 22 (L) 04/12/2020 5427   All questions were answered to patient's stated satisfaction. Encouraged patient to call with any new concerns or questions before his next visit to the cancer center and we can certain see him sooner, if needed.  ASSESSMENT and THERAPY PLAN:   CLL (chronic lymphocytic leukemia) ([place]) 1. Stage 0 CLL: - Diagnosed by peripheral blood flow cytometry on 03/07/2016 showing CD5 and CD23 positive B-cell population. - Positive for CALR mutation, which can be positive in the ET and PMF.  Jak 2 and BCR/ABL negative. - He does not have any fevers, night sweats, chills, or unexplained weight loss in the past 6 months.  No recurrent infections or hospitalizations reported. -Patient denies any B symptoms.  He denies any new adenopathy. -Labs on 04/12/2020 showed WBC 32.9, hemoglobin 16.9, platelets 143, creatinine 3.17 - He will follow-up in 6 months with repeat labs.  2.  Stage I papillary renal cell carcinoma: - Right nephrectomy was done on 02/18/2015, 2.2 cm papillary RCC, limited to the kidney, margins negative. - CT of the abdomen and pelvis done on 04/03/2019 showed no acute findings.  No evidence of recurrent or metastatic carcinoma within the abdomen or pelvis.  Several left renal lesions cannot be characterized, but are stable since 2017.  Stable diffuse bladder wall thickening, likely due to prior radiation therapy.   - CKD is been stable with creatinine around 3. -CTAP done on 04/12/2020 was negative for any metastatic disease.  3.  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 -He completed radiation therapy on 08/02/2018. -He is being followed by his urologist that he sees in  anywhere. -Labs done on 04/12/2020 showed PSA 0.32   Orders Placed This Encounter  Procedures   Lactate dehydrogenase    Standing Status:   Future    Standing Expiration Date:   04/19/2021   CBC with Differential/Platelet    Standing Status:   Future    Standing Expiration Date:   04/19/2021   Comprehensive metabolic panel    Standing Status:   Future    Standing Expiration Date:   04/19/2021   Vitamin B12    Standing Status:   Future    Standing Expiration Date:   04/19/2021   VITAMIN D 25 Hydroxy (Vit-D Deficiency, Fractures)    Standing Status:   Future    Standing Expiration Date:   04/19/2021   PSA    Standing Status:   Future    Standing Expiration Date:   04/19/2021    All questions were answered. The patient knows to call the clinic with any problems, questions or concerns. We can certainly see the patient much sooner if necessary. This note was electronically signed.  I provided 30 minutes of non face-to-face telephone visit time during this encounter, and > 50% was spent counseling as documented under my assessment & plan.   Glennie Isle, NP-C 04/19/2020

## 2020-05-09 DIAGNOSIS — R809 Proteinuria, unspecified: Secondary | ICD-10-CM | POA: Diagnosis not present

## 2020-05-09 DIAGNOSIS — E211 Secondary hyperparathyroidism, not elsewhere classified: Secondary | ICD-10-CM | POA: Diagnosis not present

## 2020-05-09 DIAGNOSIS — E870 Hyperosmolality and hypernatremia: Secondary | ICD-10-CM | POA: Diagnosis not present

## 2020-05-09 DIAGNOSIS — N184 Chronic kidney disease, stage 4 (severe): Secondary | ICD-10-CM | POA: Diagnosis not present

## 2020-05-09 DIAGNOSIS — E872 Acidosis: Secondary | ICD-10-CM | POA: Diagnosis not present

## 2020-05-13 DIAGNOSIS — N184 Chronic kidney disease, stage 4 (severe): Secondary | ICD-10-CM | POA: Diagnosis not present

## 2020-05-13 DIAGNOSIS — E872 Acidosis: Secondary | ICD-10-CM | POA: Diagnosis not present

## 2020-05-13 DIAGNOSIS — R809 Proteinuria, unspecified: Secondary | ICD-10-CM | POA: Diagnosis not present

## 2020-05-13 DIAGNOSIS — E211 Secondary hyperparathyroidism, not elsewhere classified: Secondary | ICD-10-CM | POA: Diagnosis not present

## 2020-06-20 DIAGNOSIS — C642 Malignant neoplasm of left kidney, except renal pelvis: Secondary | ICD-10-CM | POA: Diagnosis not present

## 2020-07-11 DIAGNOSIS — N184 Chronic kidney disease, stage 4 (severe): Secondary | ICD-10-CM | POA: Diagnosis not present

## 2020-07-11 DIAGNOSIS — E872 Acidosis: Secondary | ICD-10-CM | POA: Diagnosis not present

## 2020-07-11 DIAGNOSIS — E211 Secondary hyperparathyroidism, not elsewhere classified: Secondary | ICD-10-CM | POA: Diagnosis not present

## 2020-07-11 DIAGNOSIS — R809 Proteinuria, unspecified: Secondary | ICD-10-CM | POA: Diagnosis not present

## 2020-07-15 DIAGNOSIS — N170 Acute kidney failure with tubular necrosis: Secondary | ICD-10-CM | POA: Diagnosis not present

## 2020-07-15 DIAGNOSIS — R809 Proteinuria, unspecified: Secondary | ICD-10-CM | POA: Diagnosis not present

## 2020-07-15 DIAGNOSIS — E870 Hyperosmolality and hypernatremia: Secondary | ICD-10-CM | POA: Diagnosis not present

## 2020-07-15 DIAGNOSIS — N184 Chronic kidney disease, stage 4 (severe): Secondary | ICD-10-CM | POA: Diagnosis not present

## 2020-07-15 DIAGNOSIS — E211 Secondary hyperparathyroidism, not elsewhere classified: Secondary | ICD-10-CM | POA: Diagnosis not present

## 2020-08-30 DIAGNOSIS — L570 Actinic keratosis: Secondary | ICD-10-CM | POA: Diagnosis not present

## 2020-08-31 DIAGNOSIS — S0502XA Injury of conjunctiva and corneal abrasion without foreign body, left eye, initial encounter: Secondary | ICD-10-CM | POA: Diagnosis not present

## 2020-09-05 DIAGNOSIS — S0502XD Injury of conjunctiva and corneal abrasion without foreign body, left eye, subsequent encounter: Secondary | ICD-10-CM | POA: Diagnosis not present

## 2020-09-09 DIAGNOSIS — E211 Secondary hyperparathyroidism, not elsewhere classified: Secondary | ICD-10-CM | POA: Diagnosis not present

## 2020-09-09 DIAGNOSIS — R809 Proteinuria, unspecified: Secondary | ICD-10-CM | POA: Diagnosis not present

## 2020-09-09 DIAGNOSIS — E870 Hyperosmolality and hypernatremia: Secondary | ICD-10-CM | POA: Diagnosis not present

## 2020-09-09 DIAGNOSIS — N170 Acute kidney failure with tubular necrosis: Secondary | ICD-10-CM | POA: Diagnosis not present

## 2020-09-09 DIAGNOSIS — N184 Chronic kidney disease, stage 4 (severe): Secondary | ICD-10-CM | POA: Diagnosis not present

## 2020-09-15 DIAGNOSIS — N184 Chronic kidney disease, stage 4 (severe): Secondary | ICD-10-CM | POA: Diagnosis not present

## 2020-09-15 DIAGNOSIS — R809 Proteinuria, unspecified: Secondary | ICD-10-CM | POA: Diagnosis not present

## 2020-09-15 DIAGNOSIS — E211 Secondary hyperparathyroidism, not elsewhere classified: Secondary | ICD-10-CM | POA: Diagnosis not present

## 2020-09-15 DIAGNOSIS — E870 Hyperosmolality and hypernatremia: Secondary | ICD-10-CM | POA: Diagnosis not present

## 2020-10-12 ENCOUNTER — Other Ambulatory Visit: Payer: Self-pay

## 2020-10-12 ENCOUNTER — Inpatient Hospital Stay (HOSPITAL_COMMUNITY): Payer: Medicare Other | Attending: Oncology

## 2020-10-12 DIAGNOSIS — Z800 Family history of malignant neoplasm of digestive organs: Secondary | ICD-10-CM | POA: Diagnosis not present

## 2020-10-12 DIAGNOSIS — Z79899 Other long term (current) drug therapy: Secondary | ICD-10-CM | POA: Diagnosis not present

## 2020-10-12 DIAGNOSIS — F17210 Nicotine dependence, cigarettes, uncomplicated: Secondary | ICD-10-CM | POA: Insufficient documentation

## 2020-10-12 DIAGNOSIS — C641 Malignant neoplasm of right kidney, except renal pelvis: Secondary | ICD-10-CM | POA: Diagnosis not present

## 2020-10-12 DIAGNOSIS — C61 Malignant neoplasm of prostate: Secondary | ICD-10-CM | POA: Diagnosis not present

## 2020-10-12 DIAGNOSIS — C9110 Chronic lymphocytic leukemia of B-cell type not having achieved remission: Secondary | ICD-10-CM | POA: Diagnosis not present

## 2020-10-12 DIAGNOSIS — N189 Chronic kidney disease, unspecified: Secondary | ICD-10-CM | POA: Diagnosis not present

## 2020-10-12 LAB — COMPREHENSIVE METABOLIC PANEL
ALT: 22 U/L (ref 0–44)
AST: 24 U/L (ref 15–41)
Albumin: 4 g/dL (ref 3.5–5.0)
Alkaline Phosphatase: 76 U/L (ref 38–126)
Anion gap: 11 (ref 5–15)
BUN: 28 mg/dL — ABNORMAL HIGH (ref 8–23)
CO2: 25 mmol/L (ref 22–32)
Calcium: 9.6 mg/dL (ref 8.9–10.3)
Chloride: 105 mmol/L (ref 98–111)
Creatinine, Ser: 2.97 mg/dL — ABNORMAL HIGH (ref 0.61–1.24)
GFR, Estimated: 22 mL/min — ABNORMAL LOW (ref >60)
Glucose, Bld: 136 mg/dL — ABNORMAL HIGH (ref 70–99)
Potassium: 4.7 mmol/L (ref 3.5–5.1)
Sodium: 141 mmol/L (ref 135–145)
Total Bilirubin: 0.6 mg/dL (ref 0.3–1.2)
Total Protein: 6.8 g/dL (ref 6.5–8.1)

## 2020-10-12 LAB — VITAMIN D 25 HYDROXY (VIT D DEFICIENCY, FRACTURES): Vit D, 25-Hydroxy: 57.37 ng/mL (ref 30–100)

## 2020-10-12 LAB — PSA: Prostatic Specific Antigen: 0.21 ng/mL (ref 0.00–4.00)

## 2020-10-12 LAB — VITAMIN B12: Vitamin B-12: 792 pg/mL (ref 180–914)

## 2020-10-12 LAB — LACTATE DEHYDROGENASE: LDH: 116 U/L (ref 98–192)

## 2020-10-13 LAB — CBC WITH DIFFERENTIAL/PLATELET
Basophils Absolute: 0 10*3/uL (ref 0.0–0.1)
Basophils Relative: 0 %
Eosinophils Absolute: 0.3 10*3/uL (ref 0.0–0.5)
Eosinophils Relative: 1 %
HCT: 47.9 % (ref 39.0–52.0)
Hemoglobin: 15.5 g/dL (ref 13.0–17.0)
Lymphocytes Relative: 73 %
Lymphs Abs: 25.5 10*3/uL — ABNORMAL HIGH (ref 0.7–4.0)
MCH: 32 pg (ref 26.0–34.0)
MCHC: 32.4 g/dL (ref 30.0–36.0)
MCV: 98.8 fL (ref 80.0–100.0)
Monocytes Absolute: 1 10*3/uL (ref 0.1–1.0)
Monocytes Relative: 3 %
Myelocytes: 3 %
Neutro Abs: 7 10*3/uL (ref 1.7–7.7)
Neutrophils Relative %: 20 %
Platelets: 199 10*3/uL (ref 150–400)
RBC: 4.85 MIL/uL (ref 4.22–5.81)
RDW: 15.1 % (ref 11.5–15.5)
WBC: 34.9 10*3/uL — ABNORMAL HIGH (ref 4.0–10.5)
nRBC: 0 % (ref 0.0–0.2)

## 2020-10-14 LAB — PATHOLOGIST SMEAR REVIEW

## 2020-10-14 NOTE — Progress Notes (Signed)
Keith Rivera, [place] [address]   CLINIC:  Medical Oncology/Hematology  PCP:  Celene Squibb, MD [address] [place] [address] [phone]   REASON FOR VISIT: Follow-up of CLL, renal cell carcinoma and prostate cancer  CURRENT THERAPY: Observation   INTERVAL HISTORY:  Mr. Keith Rivera 69 y.o. male returns for routine follow-up of CLL, renal cell carcinoma and prostate cancer.  He was last contacted during a telemedicine visit on 04/19/2020.  Mr. Belcastro reports that he is doing exceptionally well.  He continues to work full-time.  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  This has resolved spontaneously without any intervention.  He reports that his appetite is at approximately 90% which is slightly less than his last visit and that his energy level remains at around 75%.  This is stable.  A restaging CT scan of the abdomen and pelvis completed on 04/12/2020 returned showing:  FINDINGS: Lower chest: No acute findings.  Hepatobiliary: Multiple fluid attenuation cysts are again seen in the right and left lobes, without significant change. No mass visualized on this unenhanced exam. Tiny calcified gallstones again seen, without evidence of cholecystitis or biliary ductal dilatation.  Pancreas: No mass or inflammatory process visualized on this unenhanced exam.  Spleen:  Within normal limits in size.  Adrenals/Urinary tract: Normal adrenal glands. Previous right nephrectomy again seen. Left renal parenchymal scarring again noted. Multiple left renal lesions are seen which cannot be characterized on this unenhanced exam, but remains stable. No evidence of urolithiasis or hydronephrosis. Stable mild diffuse bladder wall thickening and trabeculation.  Stomach/Bowel: No evidence of obstruction, inflammatory process, or abnormal fluid collections. Normal appendix  visualized. Diverticulosis is seen mainly involving the descending and sigmoid colon, however there is no evidence of diverticulitis.  Vascular/Lymphatic: No pathologically enlarged lymph nodes identified. No evidence of abdominal aortic aneurysm. Aortic atherosclerosis noted.  Reproductive: Small prostate with left-sided fiducial markers noted.  Other:  None.  Musculoskeletal:  No suspicious bone lesions identified.  IMPRESSION: 1. Stable exam. No evidence of recurrent or metastatic carcinoma. 2. No evidence of lymphadenopathy or splenomegaly. 3. Several left renal lesions cannot be characterized on this unenhanced exam, but remain stable. 4. Stable mild diffuse bladder wall thickening and trabeculation, likely due to prior radiation therapy. 5. Colonic diverticulosis, without radiographic evidence of diverticulitis. 6. Cholelithiasis. No radiographic evidence of cholecystitis.  Aortic Atherosclerosis (ICD10-I70.0).     REVIEW OF SYSTEMS:   Review of Systems  Constitutional: Positive for malaise/fatigue. Negative for chills, diaphoresis, fever and weight loss.  HENT: Negative for congestion and sore throat.   Eyes: Negative.   Respiratory: Negative for cough, hemoptysis, sputum production, shortness of breath and wheezing.   Cardiovascular: Negative for chest pain, palpitations and claudication.  Gastrointestinal: Negative for blood in stool, constipation, diarrhea, melena, nausea and vomiting.  Genitourinary: Negative for dysuria, frequency and urgency.  Musculoskeletal: Negative for back pain, joint pain, myalgias and neck pain.  Skin: Negative for itching and rash.  Neurological: Negative for dizziness, tingling and headaches.  Endo/Heme/Allergies: Negative.   Psychiatric/Behavioral: Negative.      PAST MEDICAL/SURGICAL HISTORY:  Past Medical History:  Diagnosis Date  . Anemia   . BPH (benign prostatic hyperplasia)   . Chronic kidney disease   .  History of hydronephrosis   . Hyperlipidemia   . MVP (mitral valve prolapse)    Mild mitral regurgitation 2014  . Prostate  cancer [facility])    Past Surgical History:  Procedure Laterality Date  . CATARACT EXTRACTION W/PHACO Right 11/28/2015   Procedure: CATARACT EXTRACTION PHACO AND INTRAOCULAR LENS PLACEMENT (IOC);  Surgeon: Williams Che, MD;  Location: [facility];  Service: Ophthalmology;  Laterality: Right;  CDE: 7.14  . COLONOSCOPY N/A 02/08/2015   Procedure: COLONOSCOPY;  Surgeon: Daneil Dolin, MD;  Location: [facility];  Service: Endoscopy;  Laterality: N/A;  200  . COLONOSCOPY N/A 02/26/2018   Procedure: COLONOSCOPY;  Surgeon: Daneil Dolin, MD;  Location: [facility];  Service: Endoscopy;  Laterality: N/A;  1:00  . CYSTOSCOPY WITH RETROGRADE PYELOGRAM, URETEROSCOPY AND STENT PLACEMENT Bilateral 12/03/2014   Procedure: CYSTOSCOPY WITH BILATERAL RETROGRADE PYELOGRAM, BILATERAL  URETEROSCOPY ;  Surgeon: Claybon Jabs, MD;  Location: [facility];  Service: Urology;  Laterality: Bilateral;  . INGUINAL HERNIA REPAIR Left   . LAPAROSCOPIC NEPHRECTOMY Right 02/18/2015   Procedure: LAPAROSCOPIC RIGHT RADICAL NEPHRECTOMY;  Surgeon: Ardis Hughs, MD;  Location: [facility];  Service: Urology;  Laterality: Right;  . NEPHROSTOMY  2006   DUE TO ENLARGED PROSTATE  . POLYPECTOMY  02/26/2018   Procedure: POLYPECTOMY;  Surgeon: Daneil Dolin, MD;  Location: [facility];  Service: Endoscopy;;  ascending colon  . TONSILLECTOMY    . TRANSURETHRAL RESECTION OF PROSTATE    . VASECTOMY       SOCIAL HISTORY:  Social History   Socioeconomic History  . Marital status: Married    Spouse name: Not on file  . Number of children: 0  . Years of education: Not on file  . Highest education level: Not on file  Occupational History  . 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 Smoking status: Current Every Day Smoker    Packs/day: 1.00    Years: 40.00    Pack  years: 40.00    Types: Cigarettes    Start date: 06/29/1971  . Smokeless tobacco: Never Used  Vaping Use  . Vaping Use: Never used  Substance and Sexual Activity  . Alcohol use: Yes    Alcohol/week: 0.0 standard drinks    Comment: Rare  . Drug use: No  . Sexual activity: Never    Birth control/protection: None  Other Topics Concern  . Not on file  Social History Narrative  . Not on file   Social Determinants of Health   Financial Resource Strain: Not on file  Food Insecurity: Not on file  Transportation Needs: Not on file  Physical Activity: Not on file  Stress: Not on file  Social Connections: Not on file  Intimate Partner Violence: Not on file    FAMILY HISTORY:  Family History  Problem Relation Age of Onset  . Heart attack Father        Died age 10  . Colon cancer Maternal Grandfather        In his 65s    CURRENT MEDICATIONS:  Outpatient Encounter Medications as of 10/17/2020  Medication Sig  . atorvastatin (LIPITOR) 20 MG tablet Take 20 mg by mouth daily.   . Beta Carotene (VITAMIN A) 25000 UNIT capsule Take 25,000 Units by mouth daily.  . cholecalciferol (VITAMIN D) 1000 UNITS tablet Take 1,000 Units by mouth every morning. For kidney function  . Coenzyme Q10 (CO Q10 PO) Take 1 tablet by mouth at bedtime.   . Cyanocobalamin (VITAMIN B-12 PO) Take 1 tablet by mouth daily.  . Multiple Vitamin (MULTIVITAMIN WITH MINERALS)  TABS tablet Take 1 tablet by mouth every morning.   . mupirocin ointment (BACTROBAN) 2 % APPLY OINTMENT EXTERNALLY TO NOSTRIL THREE TIMES DAILY AS NEEDED FOR NOSE PAIN  . sodium bicarbonate 650 MG tablet Take 650 mg by mouth 4 (four) times daily.   . tamsulosin (FLOMAX) 0.4 MG CAPS capsule Take 0.8 mg by mouth.   No facility-administered encounter medications on file as of 10/17/2020.    ALLERGIES:  Not on File   PHYSICAL EXAM:  ECOG Performance status: 1  There were no vitals filed for this visit. There were no vitals filed for this  visit.  Physical Exam Constitutional:      General: He is not in acute distress.    Appearance: He is not ill-appearing, toxic-appearing or diaphoretic.  HENT:     Head: Normocephalic and atraumatic.  Eyes:     General: No scleral icterus.       Right eye: No discharge.        Left eye: No discharge.     Conjunctiva/sclera: Conjunctivae normal.  Cardiovascular:     Rate and Rhythm: Normal rate and regular rhythm.     Heart sounds: No murmur heard. No friction rub. No gallop.   Pulmonary:     Effort: Pulmonary effort is normal. No respiratory distress.     Breath sounds: Normal breath sounds. No wheezing, rhonchi or rales.  Abdominal:     General: Bowel sounds are normal. There is no distension.     Palpations: There is no mass.     Tenderness: There is no abdominal tenderness. There is no guarding.  Musculoskeletal:     Right lower leg: No edema.     Left lower leg: No edema.  Skin:    General: Skin is warm and dry.     Coloration: Skin is not jaundiced or pale.     Findings: No bruising, erythema, lesion or rash.  Neurological:     Mental Status: He is alert.     Coordination: Coordination normal.     Gait: Gait normal.  Psychiatric:        Mood and Affect: Mood normal.        Behavior: Behavior normal.        Thought Content: Thought content normal.        Judgment: Judgment normal.      LABORATORY DATA:  I have reviewed the labs as listed.  CBC    Component Value Date/Time   WBC 34.9 (H) 10/12/2020 1314   RBC 4.85 10/12/2020 1314   HGB 15.5 10/12/2020 1314   HCT 47.9 10/12/2020 1314   PLT 199 10/12/2020 1314   MCV 98.8 10/12/2020 1314   MCH 32.0 10/12/2020 1314   MCHC 32.4 10/12/2020 1314   RDW 15.1 10/12/2020 1314   LYMPHSABS 25.5 (H) 10/12/2020 1314   MONOABS 1.0 10/12/2020 1314   EOSABS 0.3 10/12/2020 1314   BASOSABS 0.0 10/12/2020 1314   CMP Latest Ref Rng & Units 10/12/2020 04/12/2020 04/02/2019  Glucose 70 - 99 mg/dL 136(H) 98 101(H)  BUN 8 - 23  mg/dL 28(H) 22 34(H)  Creatinine 0.61 - 1.24 mg/dL 2.97(H) 3.17(H) 3.53(H)  Sodium 135 - 145 mmol/L 141 143 142  Potassium 3.5 - 5.1 mmol/L 4.7 4.0 4.0  Chloride 98 - 111 mmol/L 105 109 110  CO2 22 - 32 mmol/L 25 22 23   Calcium 8.9 - 10.3 mg/dL 9.6 9.7 9.5  Total Protein 6.5 - 8.1 g/dL 6.8 7.3 6.9  Total Bilirubin  0.3 - 1.2 mg/dL 0.6 0.9 0.9  Alkaline Phos 38 - 126 U/L 76 75 70  AST 15 - 41 U/L 24 19 18   ALT 0 - 44 U/L 22 21 19      I personally performed a face-to-face visit.  All questions were answered to patient's stated satisfaction. Encouraged patient to call with any new concerns or questions before his next visit to the cancer center and we can certain see him sooner, if needed.     ASSESSMENT & PLAN:   CLL (chronic lymphocytic leukemia) (HCC) 1. Stage 0 CLL: - Diagnosed by peripheral blood flow cytometry on 03/07/2016 showing CD5 and CD23 positive B-cell population. - Positive for CALR mutation, which can be positive in the ET and PMF.  Jak 2 and BCR/ABL negative. - He does not have any fevers, night sweats, chills, or unexplained weight loss in the past 6 months.  No recurrent infections or hospitalizations reported. -Patient denies any B symptoms.  He denies any new adenopathy. -Labs on  10/12/2020 showed WBC 34 .9, hemoglobin 15.5, platelets 199, creatinine 2.97, LDH 166 - He requests to have a telemedicine visit in 6 months with repeat labs.  2.  Stage I papillary renal cell carcinoma: - Right nephrectomy was done on 02/18/2015, 2.2 cm papillary RCC, limited to the kidney, margins negative. - CT of the abdomen and pelvis done on 04/03/2019 showed no acute findings.  No evidence of recurrent or metastatic carcinoma within the abdomen or pelvis.  Several left renal lesions cannot be characterized, but are stable since 2017.  Stable diffuse bladder wall thickening, likely due to prior radiation therapy.   - CKD is been stable with creatinine around 3.  Labs on 10/12/2020 and  showed a creatinine of 2.97 -CTAP done on 04/12/2020 was negative for any metastatic disease.  3.  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 -He completed radiation therapy on 08/02/2018. -He is being followed by his urologist that he sees in anywhere. -Labs done on  10/12/2020 showed PSA 0.21     Sandi Mealy, MHS, PA-C Physician Assistant

## 2020-10-17 ENCOUNTER — Other Ambulatory Visit: Payer: Self-pay

## 2020-10-17 ENCOUNTER — Inpatient Hospital Stay (HOSPITAL_BASED_OUTPATIENT_CLINIC_OR_DEPARTMENT_OTHER): Payer: Medicare Other | Admitting: Medical

## 2020-10-17 VITALS — BP 122/81 | HR 97 | Temp 97.2°F | Resp 17 | Wt 166.4 lb

## 2020-10-17 DIAGNOSIS — C9110 Chronic lymphocytic leukemia of B-cell type not having achieved remission: Secondary | ICD-10-CM | POA: Diagnosis not present

## 2020-10-17 DIAGNOSIS — N189 Chronic kidney disease, unspecified: Secondary | ICD-10-CM | POA: Diagnosis not present

## 2020-10-17 DIAGNOSIS — C61 Malignant neoplasm of prostate: Secondary | ICD-10-CM

## 2020-10-17 DIAGNOSIS — C641 Malignant neoplasm of right kidney, except renal pelvis: Secondary | ICD-10-CM | POA: Diagnosis not present

## 2020-10-17 DIAGNOSIS — F17210 Nicotine dependence, cigarettes, uncomplicated: Secondary | ICD-10-CM | POA: Diagnosis not present

## 2020-10-17 DIAGNOSIS — Z800 Family history of malignant neoplasm of digestive organs: Secondary | ICD-10-CM | POA: Diagnosis not present

## 2020-10-17 DIAGNOSIS — C649 Malignant neoplasm of unspecified kidney, except renal pelvis: Secondary | ICD-10-CM | POA: Diagnosis not present

## 2020-10-17 DIAGNOSIS — Z79899 Other long term (current) drug therapy: Secondary | ICD-10-CM | POA: Diagnosis not present

## 2020-10-18 ENCOUNTER — Ambulatory Visit (HOSPITAL_COMMUNITY): Payer: Medicare Other | Admitting: Hematology and Oncology

## 2020-10-18 DIAGNOSIS — E782 Mixed hyperlipidemia: Secondary | ICD-10-CM | POA: Diagnosis not present

## 2020-10-18 DIAGNOSIS — Z23 Encounter for immunization: Secondary | ICD-10-CM | POA: Diagnosis not present

## 2020-10-18 DIAGNOSIS — L03211 Cellulitis of face: Secondary | ICD-10-CM | POA: Diagnosis not present

## 2020-10-18 DIAGNOSIS — F17210 Nicotine dependence, cigarettes, uncomplicated: Secondary | ICD-10-CM | POA: Diagnosis not present

## 2020-10-18 DIAGNOSIS — G501 Atypical facial pain: Secondary | ICD-10-CM | POA: Diagnosis not present

## 2020-10-18 DIAGNOSIS — N184 Chronic kidney disease, stage 4 (severe): Secondary | ICD-10-CM | POA: Diagnosis not present

## 2020-10-18 DIAGNOSIS — C9111 Chronic lymphocytic leukemia of B-cell type in remission: Secondary | ICD-10-CM | POA: Diagnosis not present

## 2020-10-18 DIAGNOSIS — Z0001 Encounter for general adult medical examination with abnormal findings: Secondary | ICD-10-CM | POA: Diagnosis not present

## 2020-10-19 ENCOUNTER — Ambulatory Visit (HOSPITAL_COMMUNITY): Payer: Medicare Other | Admitting: Hematology

## 2020-11-03 DIAGNOSIS — B370 Candidal stomatitis: Secondary | ICD-10-CM | POA: Diagnosis not present

## 2020-11-11 DIAGNOSIS — E870 Hyperosmolality and hypernatremia: Secondary | ICD-10-CM | POA: Diagnosis not present

## 2020-11-11 DIAGNOSIS — N184 Chronic kidney disease, stage 4 (severe): Secondary | ICD-10-CM | POA: Diagnosis not present

## 2020-11-11 DIAGNOSIS — R809 Proteinuria, unspecified: Secondary | ICD-10-CM | POA: Diagnosis not present

## 2020-11-11 DIAGNOSIS — E211 Secondary hyperparathyroidism, not elsewhere classified: Secondary | ICD-10-CM | POA: Diagnosis not present

## 2020-11-15 DIAGNOSIS — H2512 Age-related nuclear cataract, left eye: Secondary | ICD-10-CM | POA: Diagnosis not present

## 2020-11-15 DIAGNOSIS — H52222 Regular astigmatism, left eye: Secondary | ICD-10-CM | POA: Diagnosis not present

## 2020-11-15 DIAGNOSIS — H524 Presbyopia: Secondary | ICD-10-CM | POA: Diagnosis not present

## 2020-11-15 DIAGNOSIS — Z961 Presence of intraocular lens: Secondary | ICD-10-CM | POA: Diagnosis not present

## 2020-11-15 DIAGNOSIS — H26491 Other secondary cataract, right eye: Secondary | ICD-10-CM | POA: Diagnosis not present

## 2020-11-15 DIAGNOSIS — H35371 Puckering of macula, right eye: Secondary | ICD-10-CM | POA: Diagnosis not present

## 2020-11-16 DIAGNOSIS — E870 Hyperosmolality and hypernatremia: Secondary | ICD-10-CM | POA: Diagnosis not present

## 2020-11-16 DIAGNOSIS — I129 Hypertensive chronic kidney disease with stage 1 through stage 4 chronic kidney disease, or unspecified chronic kidney disease: Secondary | ICD-10-CM | POA: Diagnosis not present

## 2020-11-16 DIAGNOSIS — R809 Proteinuria, unspecified: Secondary | ICD-10-CM | POA: Diagnosis not present

## 2020-11-16 DIAGNOSIS — N184 Chronic kidney disease, stage 4 (severe): Secondary | ICD-10-CM | POA: Diagnosis not present

## 2020-11-16 DIAGNOSIS — E211 Secondary hyperparathyroidism, not elsewhere classified: Secondary | ICD-10-CM | POA: Diagnosis not present

## 2020-12-23 DIAGNOSIS — B370 Candidal stomatitis: Secondary | ICD-10-CM | POA: Diagnosis not present

## 2021-01-09 DIAGNOSIS — R3912 Poor urinary stream: Secondary | ICD-10-CM | POA: Diagnosis not present

## 2021-01-09 DIAGNOSIS — C642 Malignant neoplasm of left kidney, except renal pelvis: Secondary | ICD-10-CM | POA: Diagnosis not present

## 2021-01-13 DIAGNOSIS — I129 Hypertensive chronic kidney disease with stage 1 through stage 4 chronic kidney disease, or unspecified chronic kidney disease: Secondary | ICD-10-CM | POA: Diagnosis not present

## 2021-01-13 DIAGNOSIS — E870 Hyperosmolality and hypernatremia: Secondary | ICD-10-CM | POA: Diagnosis not present

## 2021-01-13 DIAGNOSIS — N184 Chronic kidney disease, stage 4 (severe): Secondary | ICD-10-CM | POA: Diagnosis not present

## 2021-01-13 DIAGNOSIS — E211 Secondary hyperparathyroidism, not elsewhere classified: Secondary | ICD-10-CM | POA: Diagnosis not present

## 2021-01-13 DIAGNOSIS — R809 Proteinuria, unspecified: Secondary | ICD-10-CM | POA: Diagnosis not present

## 2021-01-18 DIAGNOSIS — E211 Secondary hyperparathyroidism, not elsewhere classified: Secondary | ICD-10-CM | POA: Diagnosis not present

## 2021-01-18 DIAGNOSIS — R809 Proteinuria, unspecified: Secondary | ICD-10-CM | POA: Diagnosis not present

## 2021-01-18 DIAGNOSIS — N184 Chronic kidney disease, stage 4 (severe): Secondary | ICD-10-CM | POA: Diagnosis not present

## 2021-01-18 DIAGNOSIS — E870 Hyperosmolality and hypernatremia: Secondary | ICD-10-CM | POA: Diagnosis not present

## 2021-01-18 DIAGNOSIS — I129 Hypertensive chronic kidney disease with stage 1 through stage 4 chronic kidney disease, or unspecified chronic kidney disease: Secondary | ICD-10-CM | POA: Diagnosis not present

## 2021-01-26 ENCOUNTER — Encounter: Payer: Self-pay | Admitting: Internal Medicine

## 2021-02-06 ENCOUNTER — Encounter: Payer: Self-pay | Admitting: *Deleted

## 2021-02-06 ENCOUNTER — Ambulatory Visit (INDEPENDENT_AMBULATORY_CARE_PROVIDER_SITE_OTHER): Payer: Self-pay | Admitting: *Deleted

## 2021-02-06 ENCOUNTER — Other Ambulatory Visit: Payer: Self-pay

## 2021-02-06 VITALS — Ht 71.0 in | Wt 161.4 lb

## 2021-02-06 DIAGNOSIS — Z86010 Personal history of colonic polyps: Secondary | ICD-10-CM

## 2021-02-06 MED ORDER — PEG 3350-KCL-NA BICARB-NACL 420 G PO SOLR: 4000.0000 mL | Freq: Once | ORAL | 0 refills | Status: AC

## 2021-02-06 NOTE — Patient Instructions (Addendum)
Covid test: 04/17/2021 at 1:45 PM   Keith Rivera   [birth year] MRN: [id number] Procedure Date:  04/19/2021 Arrival Time: 9:30 AM     Location of Procedure: [facility]  PREPARATION FOR COLONOSCOPY WITH TRI-LYTE PREP   Please notify us immediately if you are diabetic, take iron supplements, or if you are on Coumadin or any other blood thinners.   Please hold the following medications: n/a  You will need to purchase 1 fleet enema and 1 box of Bisacodyl 5mg  tablets.   PROCEDURE IS SCHEDULED FOR Keith Rivera AS FOLLOWS:  Procedure Date: 04/19/2021 Time to register: 9:30 AM Place to register: [facility] Scheduled provider: Dr. Gala Rivera   2 DAYS BEFORE PROCEDURE:  DATE: 04/17/2021   DAY: Monday Begin clear liquid diet AFTER your lunch meal. NO SOLID FOODS!   1 DAY BEFORE PROCEDURE:  DATE: 04/18/2021   DAY:  Tuesday  Continue clear liquids the entire day - NO SOLID FOOD.   Diabetic medications adjustments for today: n/a  At 12:00pm (noon): Take 2 (two) Dulcolax (Bisacodyl) tablets  At 2:00pm: Start drinking your solution. Try to drink 1 (one) 8 ounce glass every 10-15 minutes, until you have consumed HALF the jug. (You should complete the first 1/2 of the jug in 2 hours. Wait 30 minutes, then drink 3-4 more glasses of the solution. Your stools should be clear; if not, you may have to consume the rest of the jug.   One hour after completing the solution: take the last 2 (two) Dulcolax (Bisacodyl) tablets, with a clear liquid.  YOU MUST DRINK PLENTY OF CLEAR LIQUIDS DURING YOUR PREP TO REDUCE RISKS OF KIDNEY FAILURE.   Continue clear liquids only until 4 hours before your procedure. Do not eat or drink anything after 6:30 AM on 04/19/2021.  EXCEPTION:  If you take medications for your heart, blood pressure or breathing, you may take these medications with a small amount of clear liquid.      DAY OF PROCEDURE:   DATE: 04/19/2021    DAY:  Wednesday The  morning of your procedure give yourself 1 (one) Fleet Enema, at least 1 hour before going to the hospital.   You may take Tylenol products. Please continue your regular medications unless we have instructed otherwise.   Diabetic medications adjustments for today. n/a  Someone MUST be available to drive you home; the hospital will cancel this appointment if you do not have a driver.   Please call the office if you have any questions (Dept: [phone]).  Please see below for Dietary Information.  CLEAR LIQUIDS INCLUDE:  Water Jello (NOT red in color)   Ice Popsicles (NOT red in color)   Tea (sugar ok, no milk/cream) Powdered fruit flavored drinks  Coffee (sugar ok, no milk/cream) Gatorade/ Lemonade/ Kool-Aid  (NOT red in color)   Juice: apple, white grape, white cranberry Soft drinks  Clear bullion, consomme, broth (fat free beef/chicken/vegetable)  Carbonated beverages (any kind)  Strained chicken noodle soup Hard Candy   REMEMBER: Clear liquids are liquids that will allow you to see your fingers on the other side of a clear glass. Be sure liquids are NOT red in color, and not cloudy, but CLEAR.   DO NOT EAT OR DRINK ANY OF THE FOLLOWING:  Dairy products of any kind   Cranberry juice Tomato juice / V8 juice   Grapefruit juice Orange juice     Red grape juice  Do not eat any solid  foods, including such foods as: cereal, oatmeal, yogurt, fruits, vegetables, creamed soups, eggs, bread, etc.    HELPFUL HINTS FOR DRINKING PREP SOLUTION:   Make sure prep is extremely cold. Refrigerate the night before. You may also put in the freezer.   You may try mixing some Crystal Light or Country Time Lemonade if you prefer. Mix in small amounts; add more if necessary.  Try drinking through a straw  Rinse mouth with water or a mouthwash between glasses, to remove after-taste.  Try sipping on a cold beverage /ice/ popsicles between glasses of prep  Place a piece of sugar-free hard candy  in mouth between glasses  If you become nauseated, try consuming smaller amounts, or stretch out the time between glasses. Stop for 30-60 minutes, then slowly start back drinking    You may call the office (Dept: [phone]) before 5:00pm, or page the doctor on call after 5:00pm ([phone]), for further instructions, if necessary.   OTHER INSTRUCTIONS  You will need a responsible adult at least 71 years of age to accompany you and drive you home. This person must remain in the waiting room during your procedure.  Wear loose fitting clothing that is easily removed.  Leave jewelry and other valuables at home.   Remove all body piercing jewelry and leave at home.  Total time from sign-in until discharge is approximately 2-3 hours.  You should go home directly after your procedure and rest. You can resume normal activities the day after your procedure.  The day of your procedure you should not:  Drive  Make legal decisions  Operate machinery  Drink alcohol  Return to work

## 2021-02-06 NOTE — Progress Notes (Addendum)
Gastroenterology Pre-Procedure Review  Request Date: 02/06/2021 Requesting Physician: 70 year repeat, Last TCS 02/26/2018 done by Dr. Gala Romney, multiple fragments of tubular adenoma, family hx of colon cancer (grandfather)  PATIENT REVIEW QUESTIONS: The patient responded to the following health history questions as indicated:    1. Diabetes Melitis: no 2. Joint replacements in the past 12 months: no 3. Major health problems in the past 3 months: no 4. Has an artificial valve or MVP: no 5. Has a defibrillator: no 6. Has been advised in past to take antibiotics in advance of a procedure like teeth cleaning: no 7. Family history of colon cancer: yes, grandfather: age 41's  8. Alcohol Use: no 9. Illicit drug Use: no 10. History of sleep apnea: no  11. History of coronary artery or other vascular stents placed within the last 12 months: no 12. History of any prior anesthesia complications: no 13. Body mass index is 22.51 kg/m.    MEDICATIONS & ALLERGIES:    Patient reports the following regarding taking any blood thinners:   Plavix? no Aspirin? no Coumadin? no Brilinta? no Xarelto? no Eliquis? no Pradaxa? no Savaysa? no Effient? no  Patient confirms/reports the following medications:  Current Outpatient Medications  Medication Sig Dispense Refill  . atorvastatin (LIPITOR) 20 MG tablet Take 20 mg by mouth daily.     . Coenzyme Q10 (CO Q10 PO) Take 1 tablet by mouth at bedtime. Takes 200 mg daily.    . Cyanocobalamin (VITAMIN B-12 PO) Take 1 tablet by mouth daily. Takes 1000 mg daily.    . Multiple Vitamin (MULTIVITAMIN WITH MINERALS) TABS tablet Take 1 tablet by mouth every morning.     . sodium bicarbonate 650 MG tablet Take 50 mg by mouth 3 (three) times daily. Takes 2 tablets 3 times daily.    Marland Kitchen UNABLE TO FIND 1 tablet daily. Med Name: Immune Defense     No current facility-administered medications for this visit.    Patient confirms/reports the following allergies:  No Known  Allergies  No orders of the defined types were placed in this encounter.   AUTHORIZATION INFORMATION Primary Insurance: [employer] Medicare,  ID #: [id number],  Group #: [id number] Pre-Cert / Josem Kaufmann required: No,not required  SCHEDULE INFORMATION: Procedure has been scheduled as follows:  Date: 04/19/2021 , Time: 10:30 Location: [facility] with Dr. Gala Romney  This Gastroenterology Pre-Precedure Review Form is being routed to the following provider(s): Roseanne Kaufman, NP

## 2021-02-13 NOTE — Progress Notes (Signed)
Appropriate. ASA 2.  

## 2021-03-17 DIAGNOSIS — E211 Secondary hyperparathyroidism, not elsewhere classified: Secondary | ICD-10-CM | POA: Diagnosis not present

## 2021-03-17 DIAGNOSIS — E870 Hyperosmolality and hypernatremia: Secondary | ICD-10-CM | POA: Diagnosis not present

## 2021-03-17 DIAGNOSIS — R809 Proteinuria, unspecified: Secondary | ICD-10-CM | POA: Diagnosis not present

## 2021-03-17 DIAGNOSIS — I129 Hypertensive chronic kidney disease with stage 1 through stage 4 chronic kidney disease, or unspecified chronic kidney disease: Secondary | ICD-10-CM | POA: Diagnosis not present

## 2021-03-17 DIAGNOSIS — N184 Chronic kidney disease, stage 4 (severe): Secondary | ICD-10-CM | POA: Diagnosis not present

## 2021-03-23 DIAGNOSIS — I129 Hypertensive chronic kidney disease with stage 1 through stage 4 chronic kidney disease, or unspecified chronic kidney disease: Secondary | ICD-10-CM | POA: Diagnosis not present

## 2021-03-23 DIAGNOSIS — E211 Secondary hyperparathyroidism, not elsewhere classified: Secondary | ICD-10-CM | POA: Diagnosis not present

## 2021-03-23 DIAGNOSIS — E872 Acidosis: Secondary | ICD-10-CM | POA: Diagnosis not present

## 2021-03-23 DIAGNOSIS — N184 Chronic kidney disease, stage 4 (severe): Secondary | ICD-10-CM | POA: Diagnosis not present

## 2021-03-23 DIAGNOSIS — D6959 Other secondary thrombocytopenia: Secondary | ICD-10-CM | POA: Diagnosis not present

## 2021-03-23 DIAGNOSIS — R809 Proteinuria, unspecified: Secondary | ICD-10-CM | POA: Diagnosis not present

## 2021-04-17 ENCOUNTER — Inpatient Hospital Stay (HOSPITAL_COMMUNITY): Payer: Medicare Other

## 2021-04-17 ENCOUNTER — Other Ambulatory Visit (HOSPITAL_COMMUNITY): Payer: Medicare Other

## 2021-04-18 DIAGNOSIS — E782 Mixed hyperlipidemia: Secondary | ICD-10-CM | POA: Diagnosis not present

## 2021-04-18 DIAGNOSIS — Z0000 Encounter for general adult medical examination without abnormal findings: Secondary | ICD-10-CM | POA: Diagnosis not present

## 2021-04-18 DIAGNOSIS — N184 Chronic kidney disease, stage 4 (severe): Secondary | ICD-10-CM | POA: Diagnosis not present

## 2021-04-19 ENCOUNTER — Encounter (HOSPITAL_COMMUNITY): Payer: Self-pay | Admitting: Internal Medicine

## 2021-04-19 ENCOUNTER — Other Ambulatory Visit: Payer: Self-pay

## 2021-04-19 ENCOUNTER — Ambulatory Visit (HOSPITAL_COMMUNITY)
Admission: RE | Admit: 2021-04-19 | Discharge: 2021-04-19 | Disposition: A | Discharge status: Home / Self Care | Payer: Medicare Other | Attending: Internal Medicine | Admitting: Internal Medicine

## 2021-04-19 ENCOUNTER — Encounter (HOSPITAL_COMMUNITY)
Admission: RE | Disposition: A | Discharge status: Home / Self Care | Payer: Self-pay | Source: Home / Self Care | Attending: Internal Medicine

## 2021-04-19 DIAGNOSIS — Z79899 Other long term (current) drug therapy: Secondary | ICD-10-CM | POA: Diagnosis not present

## 2021-04-19 DIAGNOSIS — Z1211 Encounter for screening for malignant neoplasm of colon: Secondary | ICD-10-CM | POA: Diagnosis not present

## 2021-04-19 DIAGNOSIS — K6289 Other specified diseases of anus and rectum: Secondary | ICD-10-CM | POA: Insufficient documentation

## 2021-04-19 DIAGNOSIS — K635 Polyp of colon: Secondary | ICD-10-CM | POA: Insufficient documentation

## 2021-04-19 DIAGNOSIS — K5730 Diverticulosis of large intestine without perforation or abscess without bleeding: Secondary | ICD-10-CM | POA: Insufficient documentation

## 2021-04-19 DIAGNOSIS — F17210 Nicotine dependence, cigarettes, uncomplicated: Secondary | ICD-10-CM | POA: Insufficient documentation

## 2021-04-19 DIAGNOSIS — K6389 Other specified diseases of intestine: Secondary | ICD-10-CM | POA: Diagnosis not present

## 2021-04-19 DIAGNOSIS — Z800 Family history of malignant neoplasm of digestive organs: Secondary | ICD-10-CM | POA: Insufficient documentation

## 2021-04-19 DIAGNOSIS — Z86010 Personal history of colonic polyps: Secondary | ICD-10-CM

## 2021-04-19 DIAGNOSIS — Z8546 Personal history of malignant neoplasm of prostate: Secondary | ICD-10-CM | POA: Diagnosis not present

## 2021-04-19 DIAGNOSIS — D124 Benign neoplasm of descending colon: Secondary | ICD-10-CM | POA: Diagnosis not present

## 2021-04-19 HISTORY — PX: POLYPECTOMY: SHX5525

## 2021-04-19 HISTORY — PX: COLONOSCOPY: SHX5424

## 2021-04-19 SURGERY — COLONOSCOPY
Anesthesia: Moderate Sedation

## 2021-04-19 MED ORDER — MEPERIDINE HCL 100 MG/ML IJ SOLN
INTRAMUSCULAR | Status: DC | PRN
  Administered 2021-04-19: 25 mg via INTRAVENOUS
  Administered 2021-04-19: 15 mg via INTRAVENOUS
  Administered 2021-04-19: 10 mg via INTRAVENOUS

## 2021-04-19 MED ORDER — SODIUM CHLORIDE 0.9 % IV SOLN: INTRAVENOUS | Status: DC

## 2021-04-19 MED ORDER — ONDANSETRON HCL 4 MG/2ML IJ SOLN
INTRAMUSCULAR | Status: DC | PRN
  Administered 2021-04-19: 4 mg via INTRAVENOUS

## 2021-04-19 MED ORDER — STERILE WATER FOR IRRIGATION IR SOLN
Status: DC | PRN
  Administered 2021-04-19: 1.5 mL

## 2021-04-19 MED ORDER — MIDAZOLAM HCL 5 MG/5ML IJ SOLN
INTRAMUSCULAR | Status: DC | PRN
  Administered 2021-04-19: 1 mg via INTRAVENOUS
  Administered 2021-04-19: 2 mg via INTRAVENOUS
  Administered 2021-04-19 (×2): 1 mg via INTRAVENOUS

## 2021-04-19 MED ORDER — ONDANSETRON HCL 4 MG/2ML IJ SOLN
INTRAMUSCULAR | Status: AC
  Filled 2021-04-19: qty 2

## 2021-04-19 MED ORDER — MIDAZOLAM HCL 5 MG/5ML IJ SOLN
INTRAMUSCULAR | Status: AC
  Filled 2021-04-19: qty 10

## 2021-04-19 MED ORDER — MEPERIDINE HCL 50 MG/ML IJ SOLN
INTRAMUSCULAR | Status: AC
  Filled 2021-04-19: qty 1

## 2021-04-19 NOTE — Op Note (Signed)
[facility] Patient Name: Keith Rivera Procedure Date: 04/19/2021 9:33 AM MRN: [id number] Date of Birth: [birth year] Attending MD: Norvel Richards , MD CSN: [id number] Age: 70 Admit Type: Outpatient Procedure:                Colonoscopy Indications:              High risk colon cancer surveillance: Personal                            history of colonic polyps Providers:                Norvel Richards, MD, Charlsie Quest. Insurance claims handler, Therapist, sports,                            Suzan Garibaldi. Risa Grill, Technician Referring MD:              Medicines:                Midazolam 5 mg IV, Meperidine 50 mg IV Complications:            No immediate complications. Estimated Blood Loss:     Estimated blood loss was minimal. Procedure:                Pre-Anesthesia Assessment:                           - Prior to the procedure, a History and Physical                            was performed, and patient medications and                            allergies were reviewed. The patient's tolerance of                            previous anesthesia was also reviewed. The risks                            and benefits of the procedure and the sedation                            options and risks were discussed with the patient.                            All questions were answered, and informed consent                            was obtained. Prior Anticoagulants: The patient has                            taken no previous anticoagulant or antiplatelet                            agents. ASA Grade Assessment: III - A patient with  severe systemic disease. After reviewing the risks                            and benefits, the patient was deemed in                            satisfactory condition to undergo the procedure.                           After obtaining informed consent, the colonoscope                            was passed under direct vision. Throughout the                             procedure, the patient's blood pressure, pulse, and                            oxygen saturations were monitored continuously. The                            CF-[id number] ([id number]) scope was introduced through                            the anus and advanced to the the cecum, identified                            by appendiceal orifice and ileocecal valve. The                            colonoscopy was performed without difficulty. The                            patient tolerated the procedure well. The quality                            of the bowel preparation was adequate. Scope In: 10:10:40 AM Scope Out: 10:28:42 AM Scope Withdrawal Time: 0 hours 12 minutes 49 seconds  Total Procedure Duration: 0 hours 18 minutes 2 seconds  Findings:      The perianal and digital rectal examinations were normal.      Two sessile polyps were found in the splenic flexure and ascending       colon. The polyps were 4 to 7 mm in size. These polyps were removed with       a cold snare. Resection and retrieval were complete. Estimated blood       loss was minimal.      Scattered small-mouthed diverticula were found in the sigmoid colon and       descending colon.      Neovascular changes distal rectum consistent with radiation effect.       Single anal papilla present.      The exam was otherwise without abnormality on direct and retroflexion       views. Impression:               - Two  4 to 7 mm polyps at the splenic flexure and                            in the ascending colon, removed with a cold snare.                            Resected and retrieved.                           - Diverticulosis in the sigmoid colon and in the                            descending colon. Single anal papilla and some                            neovascular changes distal rectum consistent with                            radiation effect.                           - The examination was otherwise normal on direct                             and retroflexion views. Moderate Sedation:      Moderate (conscious) sedation was administered by the endoscopy nurse       and supervised by the endoscopist. The following parameters were       monitored: oxygen saturation, heart rate, blood pressure, respiratory       rate, EKG, adequacy of pulmonary ventilation, and response to care.       Total physician intraservice time was 25 minutes. Recommendation:           - Patient has a contact number available for                            emergencies. The signs and symptoms of potential                            delayed complications were discussed with the                            patient. Return to normal activities tomorrow.                            Written discharge instructions were provided to the                            patient.                           - Resume previous diet.                           - Continue present medications.                           -  Repeat colonoscopy date to be determined after                            pending pathology results are reviewed for                            surveillance.                           - Return to GI office (date not yet determined). Procedure Code(s):        --- Professional ---                           [phone], Colonoscopy, flexible; with removal of                            tumor(s), polyp(s), or other lesion(s) by snare                            technique                           99153, Moderate sedation; each additional 15                            minutes intraservice time                           G0500, Moderate sedation services provided by the                            same physician or other qualified health care                            professional performing a gastrointestinal                            endoscopic service that sedation supports,                            requiring the presence of an independent trained                             observer to assist in the monitoring of the                            patient's level of consciousness and physiological                            status; initial 15 minutes of intra-service time;                            patient age 26 years or older (additional time 33  be reported with [phone], as appropriate) Diagnosis Code(s):        --- Professional ---                           Z86.010, Personal history of colonic polyps                           K63.5, Polyp of colon                           K57.30, Diverticulosis of large intestine without                            perforation or abscess without bleeding CPT copyright 2019 American Medical Association. All rights reserved. The codes documented in this report are preliminary and upon coder review may  be revised to meet current compliance requirements. Cristopher Estimable. Ariyanna Oien, MD Norvel Richards, MD 04/19/2021 10:40:03 AM This report has been signed electronically. Number of Addenda: 0

## 2021-04-19 NOTE — H&P (Signed)
@LOGO @   Primary Care Physician:  Celene Squibb, MD Primary Gastroenterologist:  Dr. Gala Romney  Pre-Procedure History & Physical: HPI:  Keith Rivera is a 70 y.o. male here for surveillance colonoscopy.  History of 12 mm adenoma removed from his ascending colon 2019.  No GI symptoms currently.  Past Medical History:  Diagnosis Date   Anemia    BPH (benign prostatic hyperplasia)    Chronic kidney disease    History of hydronephrosis    Hyperlipidemia    MVP (mitral valve prolapse)    Mild mitral regurgitation 2014   Prostate cancer [facility])     Past Surgical History:  Procedure Laterality Date   CATARACT EXTRACTION W/PHACO Right 11/28/2015   Procedure: CATARACT EXTRACTION PHACO AND INTRAOCULAR LENS PLACEMENT ([place]);  Surgeon: Williams Che, MD;  Location: [facility];  Service: Ophthalmology;  Laterality: Right;  CDE: 7.14   COLONOSCOPY N/A 02/08/2015   Procedure: COLONOSCOPY;  Surgeon: Daneil Dolin, MD;  [facility];  Service: Endoscopy;  Laterality: N/A;  200   COLONOSCOPY N/A 02/26/2018   Procedure: COLONOSCOPY;  Surgeon: Daneil Dolin, MD;  [facility];  Service: Endoscopy;  Laterality: N/A;  1:00   CYSTOSCOPY WITH RETROGRADE PYELOGRAM, URETEROSCOPY AND STENT PLACEMENT Bilateral 12/03/2014   Procedure: CYSTOSCOPY WITH BILATERAL RETROGRADE PYELOGRAM, BILATERAL  URETEROSCOPY ;  Surgeon: Claybon Jabs, MD;  Location: [facility];  Service: Urology;  Laterality: Bilateral;   INGUINAL HERNIA REPAIR Left    LAPAROSCOPIC NEPHRECTOMY Right 02/18/2015   Procedure: LAPAROSCOPIC RIGHT RADICAL NEPHRECTOMY;  Surgeon: Ardis Hughs, MD;  Location: [facility];  Service: Urology;  Laterality: Right;   NEPHROSTOMY  2006   DUE TO ENLARGED PROSTATE   POLYPECTOMY  02/26/2018   Procedure: POLYPECTOMY;  Surgeon: Daneil Dolin, MD;  [facility];  Service: Endoscopy;;  ascending colon   TONSILLECTOMY     TRANSURETHRAL RESECTION OF PROSTATE     VASECTOMY      Prior to  Admission medications   Medication Sig Start Date End Date Taking? Authorizing Provider  atorvastatin (LIPITOR) 20 MG tablet Take 20 mg by mouth at bedtime. 03/15/16  Yes [provider]  Coenzyme Q10 (CO Q10) 200 MG CAPS Take 200 mg by mouth at bedtime.   Yes [provider]  Cyanocobalamin (VITAMIN B-12 PO) Take 1,000 mcg by mouth daily.   Yes [provider]  Multiple Vitamin (MULTIVITAMIN WITH MINERALS) TABS tablet Take 1 tablet by mouth every morning.    Yes [provider]  sodium bicarbonate 650 MG tablet Take 1,300 mg by mouth 3 (three) times daily.   Yes [provider]  zinc gluconate 50 MG tablet Take 50 mg by mouth daily.   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+  Tobacco Use   Smoking status: Every Day  Packs/day: 1.00    Years: 40.00    Pack years: 40.00    Types: Cigarettes    Start date: 06/29/1971   Smokeless tobacco: Never  Vaping Use   Vaping Use: Never used  Substance and Sexual Activity   Alcohol use: Yes    Alcohol/week: 0.0 standard drinks    Comment: Rare   Drug use: No   Sexual activity: Never    Birth control/protection: None  Other Topics Concern   Not on file  Social History Narrative   Not on file   Social Determinants of Health   Financial Resource Strain: Not on file  Food Insecurity: Not on file  Transportation Needs: Not on file  Physical Activity: Not on file  Stress: Not on file  Social Connections: Not on file   Intimate Partner Violence: Not on file    Review of Systems: See HPI, otherwise negative ROS  Physical Exam: BP 123/84   Pulse 89   Temp 98.2 F (36.8 C) (Oral)   Resp (!) 22   Ht 5\' 11"  (1.803 m)   SpO2 99%   BMI 22.51 kg/m  General:   Alert,  Well-developed, well-nourished, pleasant and cooperative in NAD Mouth:  No deformity or lesions. Neck:  Supple; no masses or thyromegaly. No significant cervical adenopathy. Lungs:  Clear throughout to auscultation.   No wheezes, crackles, or rhonchi. No acute distress. Heart:  Regular rate and rhythm; no murmurs, clicks, rubs,  or gallops. Abdomen: Non-distended, normal bowel sounds.  Soft and nontender without appreciable mass or hepatosplenomegaly.  Pulses:  Normal pulses noted. Extremities:  Without clubbing or edema.  Impression/Plan: 70 year old gentleman with a history colonic adenoma.  Here for surveillance colonoscopy per plan. The risks, benefits, limitations, alternatives and imponderables have been reviewed with the patient. Questions have been answered. All parties are agreeable.       Notice: This dictation was prepared with Dragon dictation along with smaller phrase technology. Any transcriptional errors that result from this process are unintentional and may not be corrected upon review.

## 2021-04-19 NOTE — Discharge Instructions (Addendum)
  Colonoscopy Discharge Instructions  Read the instructions outlined below and refer to this sheet in the next few weeks. These discharge instructions provide you with general information on caring for yourself after you leave the hospital. Your doctor may also give you specific instructions. While your treatment has been planned according to the most current medical practices available, unavoidable complications occasionally occur. If you have any problems or questions after discharge, call Dr. Gala Romney at [phone]. ACTIVITY You may resume your regular activity, but move at a slower pace for the next 24 hours.  Take frequent rest periods for the next 24 hours.  Walking will help get rid of the air and reduce the bloated feeling in your belly (abdomen).  No driving for 24 hours (because of the medicine (anesthesia) used during the test).   Do not sign any important legal documents or operate any machinery for 24 hours (because of the anesthesia used during the test).  NUTRITION Drink plenty of fluids.  You may resume your normal diet as instructed by your doctor.  Begin with a light meal and progress to your normal diet. Heavy or fried foods are harder to digest and may make you feel sick to your stomach (nauseated).  Avoid alcoholic beverages for 24 hours or as instructed.  MEDICATIONS You may resume your normal medications unless your doctor tells you otherwise.  WHAT YOU CAN EXPECT TODAY Some feelings of bloating in the abdomen.  Passage of more gas than usual.  Spotting of blood in your stool or on the toilet paper.  IF YOU HAD POLYPS REMOVED DURING THE COLONOSCOPY: No aspirin products for 7 days or as instructed.  No alcohol for 7 days or as instructed.  Eat a soft diet for the next 24 hours.  FINDING OUT THE RESULTS OF YOUR TEST Not all test results are available during your visit. If your test results are not back during the visit, make an appointment with your caregiver to find out the  results. Do not assume everything is normal if you have not heard from your caregiver or the medical facility. It is important for you to follow up on all of your test results.  SEEK IMMEDIATE MEDICAL ATTENTION IF: You have more than a spotting of blood in your stool.  Your belly is swollen (abdominal distention).  You are nauseated or vomiting.  You have a temperature over 101.  You have abdominal pain or discomfort that is severe or gets worse throughout the day  .   2 polyps removed in your colon today  Diverticulosis information provided  Further recommendations to follow pending review of pathology report  At patient request, called Hoyle Sauer at [phone] -reviewed findings and recommendations

## 2021-04-20 ENCOUNTER — Telehealth (HOSPITAL_COMMUNITY): Payer: Medicare Other | Admitting: Hematology

## 2021-04-20 LAB — SURGICAL PATHOLOGY

## 2021-04-21 ENCOUNTER — Telehealth: Payer: Self-pay

## 2021-04-21 ENCOUNTER — Encounter: Payer: Self-pay | Admitting: Internal Medicine

## 2021-04-21 DIAGNOSIS — N184 Chronic kidney disease, stage 4 (severe): Secondary | ICD-10-CM | POA: Diagnosis not present

## 2021-04-21 DIAGNOSIS — D696 Thrombocytopenia, unspecified: Secondary | ICD-10-CM | POA: Diagnosis not present

## 2021-04-21 DIAGNOSIS — C641 Malignant neoplasm of right kidney, except renal pelvis: Secondary | ICD-10-CM | POA: Diagnosis not present

## 2021-04-21 DIAGNOSIS — F17200 Nicotine dependence, unspecified, uncomplicated: Secondary | ICD-10-CM | POA: Diagnosis not present

## 2021-04-21 DIAGNOSIS — K117 Disturbances of salivary secretion: Secondary | ICD-10-CM | POA: Diagnosis not present

## 2021-04-21 DIAGNOSIS — M545 Low back pain, unspecified: Secondary | ICD-10-CM | POA: Diagnosis not present

## 2021-04-21 DIAGNOSIS — C9190 Lymphoid leukemia, unspecified not having achieved remission: Secondary | ICD-10-CM | POA: Diagnosis not present

## 2021-04-21 DIAGNOSIS — E782 Mixed hyperlipidemia: Secondary | ICD-10-CM | POA: Diagnosis not present

## 2021-04-21 NOTE — Progress Notes (Signed)
Letter mailed out to the pt.

## 2021-04-21 NOTE — Telephone Encounter (Signed)
Please NIC for repeat colonoscopy in 5 years

## 2021-04-24 ENCOUNTER — Telehealth (HOSPITAL_COMMUNITY): Payer: Medicare Other | Admitting: Hematology

## 2021-04-25 ENCOUNTER — Encounter (HOSPITAL_COMMUNITY): Payer: Self-pay | Admitting: Internal Medicine

## 2021-04-26 ENCOUNTER — Telehealth (HOSPITAL_COMMUNITY): Payer: Medicare Other | Admitting: Hematology and Oncology

## 2021-04-27 ENCOUNTER — Telehealth (HOSPITAL_COMMUNITY): Payer: Medicare Other | Admitting: Hematology and Oncology

## 2021-04-28 ENCOUNTER — Inpatient Hospital Stay (HOSPITAL_COMMUNITY): Payer: Medicare Other | Admitting: Hematology and Oncology

## 2021-04-28 ENCOUNTER — Encounter (HOSPITAL_COMMUNITY): Payer: Self-pay | Admitting: Hematology and Oncology

## 2021-04-28 ENCOUNTER — Other Ambulatory Visit: Payer: Self-pay

## 2021-04-28 NOTE — Progress Notes (Signed)
This encounter was created in error - please disregard.

## 2021-05-09 ENCOUNTER — Other Ambulatory Visit (HOSPITAL_COMMUNITY): Payer: Medicare Other

## 2021-05-10 ENCOUNTER — Other Ambulatory Visit (HOSPITAL_COMMUNITY): Payer: Self-pay | Admitting: *Deleted

## 2021-05-10 DIAGNOSIS — C649 Malignant neoplasm of unspecified kidney, except renal pelvis: Secondary | ICD-10-CM

## 2021-05-10 DIAGNOSIS — C9110 Chronic lymphocytic leukemia of B-cell type not having achieved remission: Secondary | ICD-10-CM

## 2021-05-10 DIAGNOSIS — C61 Malignant neoplasm of prostate: Secondary | ICD-10-CM

## 2021-05-11 ENCOUNTER — Other Ambulatory Visit: Payer: Self-pay

## 2021-05-11 ENCOUNTER — Inpatient Hospital Stay (HOSPITAL_COMMUNITY): Payer: Medicare Other | Attending: Hematology

## 2021-05-11 DIAGNOSIS — Z79899 Other long term (current) drug therapy: Secondary | ICD-10-CM | POA: Diagnosis not present

## 2021-05-11 DIAGNOSIS — Z8249 Family history of ischemic heart disease and other diseases of the circulatory system: Secondary | ICD-10-CM | POA: Insufficient documentation

## 2021-05-11 DIAGNOSIS — Z923 Personal history of irradiation: Secondary | ICD-10-CM | POA: Insufficient documentation

## 2021-05-11 DIAGNOSIS — Z800 Family history of malignant neoplasm of digestive organs: Secondary | ICD-10-CM | POA: Insufficient documentation

## 2021-05-11 DIAGNOSIS — C9110 Chronic lymphocytic leukemia of B-cell type not having achieved remission: Secondary | ICD-10-CM | POA: Insufficient documentation

## 2021-05-11 DIAGNOSIS — C649 Malignant neoplasm of unspecified kidney, except renal pelvis: Secondary | ICD-10-CM | POA: Insufficient documentation

## 2021-05-11 DIAGNOSIS — C61 Malignant neoplasm of prostate: Secondary | ICD-10-CM | POA: Insufficient documentation

## 2021-05-11 DIAGNOSIS — F17210 Nicotine dependence, cigarettes, uncomplicated: Secondary | ICD-10-CM | POA: Diagnosis not present

## 2021-05-11 DIAGNOSIS — Z905 Acquired absence of kidney: Secondary | ICD-10-CM | POA: Diagnosis not present

## 2021-05-11 LAB — CBC WITH DIFFERENTIAL/PLATELET
Abs Immature Granulocytes: 0.07 10*3/uL (ref 0.00–0.07)
Basophils Absolute: 0.1 10*3/uL (ref 0.0–0.1)
Basophils Relative: 0 %
Eosinophils Absolute: 0.5 10*3/uL (ref 0.0–0.5)
Eosinophils Relative: 1 %
HCT: 50.6 % (ref 39.0–52.0)
Hemoglobin: 16.2 g/dL (ref 13.0–17.0)
Immature Granulocytes: 0 %
Lymphocytes Relative: 71 %
Lymphs Abs: 24.6 10*3/uL — ABNORMAL HIGH (ref 0.7–4.0)
MCH: 32.3 pg (ref 26.0–34.0)
MCHC: 32 g/dL (ref 30.0–36.0)
MCV: 100.8 fL — ABNORMAL HIGH (ref 80.0–100.0)
Monocytes Absolute: 6.2 10*3/uL — ABNORMAL HIGH (ref 0.1–1.0)
Monocytes Relative: 18 %
Neutro Abs: 3.6 10*3/uL (ref 1.7–7.7)
Neutrophils Relative %: 10 %
Platelets: 120 10*3/uL — ABNORMAL LOW (ref 150–400)
RBC: 5.02 MIL/uL (ref 4.22–5.81)
RDW: 14.8 % (ref 11.5–15.5)
WBC: 35.1 10*3/uL — ABNORMAL HIGH (ref 4.0–10.5)
nRBC: 0 % (ref 0.0–0.2)

## 2021-05-11 LAB — COMPREHENSIVE METABOLIC PANEL
ALT: 23 U/L (ref 0–44)
AST: 22 U/L (ref 15–41)
Albumin: 4.2 g/dL (ref 3.5–5.0)
Alkaline Phosphatase: 73 U/L (ref 38–126)
Anion gap: 8 (ref 5–15)
BUN: 29 mg/dL — ABNORMAL HIGH (ref 8–23)
CO2: 27 mmol/L (ref 22–32)
Calcium: 9.8 mg/dL (ref 8.9–10.3)
Chloride: 107 mmol/L (ref 98–111)
Creatinine, Ser: 3.19 mg/dL — ABNORMAL HIGH (ref 0.61–1.24)
GFR, Estimated: 20 mL/min — ABNORMAL LOW (ref >60)
Glucose, Bld: 96 mg/dL (ref 70–99)
Potassium: 4.2 mmol/L (ref 3.5–5.1)
Sodium: 142 mmol/L (ref 135–145)
Total Bilirubin: 1 mg/dL (ref 0.3–1.2)
Total Protein: 6.9 g/dL (ref 6.5–8.1)

## 2021-05-11 LAB — VITAMIN D 25 HYDROXY (VIT D DEFICIENCY, FRACTURES): Vit D, 25-Hydroxy: 49.32 ng/mL (ref 30–100)

## 2021-05-11 LAB — LACTATE DEHYDROGENASE: LDH: 128 U/L (ref 98–192)

## 2021-05-11 LAB — PSA: Prostatic Specific Antigen: 0.1 ng/mL (ref 0.00–4.00)

## 2021-05-11 LAB — VITAMIN B12: Vitamin B-12: 673 pg/mL (ref 180–914)

## 2021-05-19 ENCOUNTER — Inpatient Hospital Stay (HOSPITAL_BASED_OUTPATIENT_CLINIC_OR_DEPARTMENT_OTHER): Payer: Medicare Other | Admitting: Hematology and Oncology

## 2021-05-19 ENCOUNTER — Encounter (HOSPITAL_COMMUNITY): Payer: Self-pay | Admitting: Hematology and Oncology

## 2021-05-19 DIAGNOSIS — C9110 Chronic lymphocytic leukemia of B-cell type not having achieved remission: Secondary | ICD-10-CM | POA: Diagnosis not present

## 2021-05-19 DIAGNOSIS — E211 Secondary hyperparathyroidism, not elsewhere classified: Secondary | ICD-10-CM | POA: Diagnosis not present

## 2021-05-19 DIAGNOSIS — C649 Malignant neoplasm of unspecified kidney, except renal pelvis: Secondary | ICD-10-CM | POA: Diagnosis not present

## 2021-05-19 DIAGNOSIS — D6959 Other secondary thrombocytopenia: Secondary | ICD-10-CM | POA: Diagnosis not present

## 2021-05-19 DIAGNOSIS — N189 Chronic kidney disease, unspecified: Secondary | ICD-10-CM | POA: Diagnosis not present

## 2021-05-19 DIAGNOSIS — C61 Malignant neoplasm of prostate: Secondary | ICD-10-CM

## 2021-05-19 DIAGNOSIS — R809 Proteinuria, unspecified: Secondary | ICD-10-CM | POA: Diagnosis not present

## 2021-05-19 DIAGNOSIS — I129 Hypertensive chronic kidney disease with stage 1 through stage 4 chronic kidney disease, or unspecified chronic kidney disease: Secondary | ICD-10-CM | POA: Diagnosis not present

## 2021-05-19 DIAGNOSIS — N184 Chronic kidney disease, stage 4 (severe): Secondary | ICD-10-CM | POA: Diagnosis not present

## 2021-05-19 NOTE — Progress Notes (Signed)
Keith Rivera, [place] [address]   CLINIC:  Medical Oncology/Hematology  PCP:  Celene Squibb, MD [address] [place] [address] [phone]   REASON FOR VISIT: Follow-up of CLL, renal cell carcinoma and prostate cancer, recent labs to review  CURRENT THERAPY: Observation   INTERVAL HISTORY:   Mr. Keith Rivera 70 y.o. male returns for routine follow-up of CLL, renal cell carcinoma and prostate cancer.   He is doing very well. No complaints. No B symptoms. No change in breathing, bowel habits or urinary habits Medications are up to date, no change   Review of Systems  Constitutional:  Negative for chills, diaphoresis, fever, malaise/fatigue and weight loss.  HENT:  Negative for congestion and sore throat.   Eyes: Negative.   Respiratory:  Negative for cough, hemoptysis, sputum production, shortness of breath and wheezing.   Cardiovascular:  Negative for chest pain, palpitations and claudication.  Gastrointestinal:  Negative for blood in stool, constipation, diarrhea, melena, nausea and vomiting.  Genitourinary:  Negative for dysuria, frequency and urgency.  Musculoskeletal:  Negative for back pain, joint pain, myalgias and neck pain.  Skin:  Negative for itching and rash.  Neurological:  Negative for dizziness, tingling and headaches.  Endo/Heme/Allergies: Negative.   Psychiatric/Behavioral: Negative.      PAST MEDICAL/SURGICAL HISTORY:  Past Medical History:  Diagnosis Date   Anemia    BPH (benign prostatic hyperplasia)    Chronic kidney disease    History of hydronephrosis    Hyperlipidemia    MVP (mitral valve prolapse)    Mild mitral regurgitation 2014   Prostate cancer [facility])    Past Surgical History:  Procedure Laterality Date   CATARACT EXTRACTION W/PHACO Right 11/28/2015   Procedure: CATARACT EXTRACTION PHACO AND INTRAOCULAR LENS PLACEMENT ([place]);  Surgeon: Williams Che, MD;  Location: [facility];  Service:  Ophthalmology;  Laterality: Right;  CDE: 7.14   COLONOSCOPY N/A 02/08/2015   Procedure: COLONOSCOPY;  Surgeon: Daneil Dolin, MD;  [facility];  Service: Endoscopy;  Laterality: N/A;  200   COLONOSCOPY N/A 02/26/2018   Procedure: COLONOSCOPY;  Surgeon: Daneil Dolin, MD;  [facility];  Service: Endoscopy;  Laterality: N/A;  1:00   COLONOSCOPY N/A 04/19/2021   Procedure: COLONOSCOPY;  Surgeon: Daneil Dolin, MD;  [facility];  Service: Endoscopy;  Laterality: N/A;  ASA II / 10:30   CYSTOSCOPY WITH RETROGRADE PYELOGRAM, URETEROSCOPY AND STENT PLACEMENT Bilateral 12/03/2014   Procedure: CYSTOSCOPY WITH BILATERAL RETROGRADE PYELOGRAM, BILATERAL  URETEROSCOPY ;  Surgeon: Claybon Jabs, MD;  [facility];  Service: Urology;  Laterality: Bilateral;   INGUINAL HERNIA REPAIR Left    LAPAROSCOPIC NEPHRECTOMY Right 02/18/2015   Procedure: LAPAROSCOPIC RIGHT RADICAL NEPHRECTOMY;  Surgeon: Ardis Hughs, MD;  [facility];  Service: Urology;  Laterality: Right;   NEPHROSTOMY  2006   DUE TO ENLARGED PROSTATE   POLYPECTOMY  02/26/2018   Procedure: POLYPECTOMY;  Surgeon: Daneil Dolin, MD;  [facility];  Service: Endoscopy;;  ascending colon   POLYPECTOMY  04/19/2021   Procedure: POLYPECTOMY;  Surgeon: Daneil Dolin, MD;  [facility];  Service: Endoscopy;;   TONSILLECTOMY     TRANSURETHRAL RESECTION OF PROSTATE     VASECTOMY       SOCIAL HISTORY:  Social History   Socioeconomic History   Marital status: Married    Spouse name: Not on file  Number of children: 0   Years of education: Not on file   Highest education level: Not on file  Occupational History   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+  Tobacco Use   Smoking status: Every Day    Packs/day: 1.00    Years: 40.00    Pack years: 40.00    Types: Cigarettes    Start date: 06/29/1971   Smokeless tobacco: Never  Vaping Use   Vaping Use:  Never used  Substance and Sexual Activity   Alcohol use: Yes    Alcohol/week: 0.0 standard drinks    Comment: Rare   Drug use: No   Sexual activity: Never    Birth control/protection: None  Other Topics Concern   Not on file  Social History Narrative   Not on file   Social Determinants of Health   Financial Resource Strain: Not on file  Food Insecurity: Not on file  Transportation Needs: Not on file  Physical Activity: Not on file  Stress: Not on file  Social Connections: Not on file  Intimate Partner Violence: Not on file    FAMILY HISTORY:  Family History  Problem Relation Age of Onset   Heart attack Father        Died age 73   Colon cancer Maternal Grandfather        In his 31s    CURRENT MEDICATIONS:  Outpatient Encounter Medications as of 05/19/2021  Medication Sig   atorvastatin (LIPITOR) 20 MG tablet Take 20 mg by mouth at bedtime.   Coenzyme Q10 (CO Q10) 200 MG CAPS Take 200 mg by mouth at bedtime.   Cyanocobalamin (VITAMIN B-12 PO) Take 1,000 mcg by mouth daily.   Multiple Vitamin (MULTIVITAMIN WITH MINERALS) TABS tablet Take 1 tablet by mouth every morning.    sodium bicarbonate 650 MG tablet Take 1,300 mg by mouth 3 (three) times daily.   zinc gluconate 50 MG tablet Take 50 mg by mouth daily.   No facility-administered encounter medications on file as of 05/19/2021.    ALLERGIES:  No Known Allergies   PHYSICAL EXAM:  ECOG Performance status: 1  There were no vitals filed for this visit. There were no vitals filed for this visit.  VS and PE not done, tele visit.  LABORATORY DATA:  I have reviewed the labs as listed.  CBC    Component Value Date/Time   WBC 35.1 (H) 05/11/2021 0858   RBC 5.02 05/11/2021 0858   HGB 16.2 05/11/2021 0858   HCT 50.6 05/11/2021 0858   PLT 120 (L) 05/11/2021 0858   MCV 100.8 (H) 05/11/2021 0858   MCH 32.3 05/11/2021 0858   MCHC 32.0 05/11/2021 0858   RDW 14.8 05/11/2021 0858   LYMPHSABS 24.6 (H) 05/11/2021 0858    MONOABS 6.2 (H) 05/11/2021 0858   EOSABS 0.5 05/11/2021 0858   BASOSABS 0.1 05/11/2021 0858   CMP Latest Ref Rng & Units 05/11/2021 10/12/2020 04/12/2020  Glucose 70 - 99 mg/dL 96 136(H) 98  BUN 8 - 23 mg/dL 29(H) 28(H) 22  Creatinine 0.61 - 1.24 mg/dL 3.19(H) 2.97(H) 3.17(H)  Sodium 135 - 145 mmol/L 142 141 143  Potassium 3.5 - 5.1 mmol/L 4.2 4.7 4.0  Chloride 98 - 111 mmol/L 107 105 109  CO2 22 - 32 mmol/L 27 25 22   Calcium 8.9 - 10.3 mg/dL 9.8 9.6 9.7  Total Protein 6.5 - 8.1 g/dL 6.9 6.8 7.3  Total Bilirubin 0.3 - 1.2 mg/dL 1.0 0.6 0.9  Alkaline Phos 38 - 126 U/L 73 76 75  AST 15 - 41 U/L 22 24 19   ALT 0 - 44 U/L 23 22 21      ASSESSMENT & PLAN:   CLL (chronic lymphocytic leukemia) (HCC) 1. Stage 0 CLL: - Diagnosed by peripheral blood flow cytometry on 03/07/2016 showing CD5 and CD23 positive B-cell population. - Positive for CALR mutation, which can be positive in the ET and PMF.  Jak 2 and BCR/ABL negative. - He does not have any fevers, night sweats, chills, or unexplained weight loss in the past 6 months.  No recurrent infections or hospitalizations reported. -Patient denies any B symptoms.  He denies any new adenopathy. -Labs on  04/2021 showed WBC 35.1 hemoglobin 16.2  platelets 120, creatinine 3.19, LDH 128 - He requests to have a follow up visit in 6 months with repeat labs   2.  Stage I papillary renal cell carcinoma: - Right nephrectomy was done on 02/18/2015, 2.2 cm papillary RCC, limited to the kidney, margins negative. - CT of the abdomen and pelvis done on 04/03/2019 showed no acute findings.  No evidence of recurrent or metastatic carcinoma within the abdomen or pelvis.  Several left renal lesions cannot be characterized, but are stable since 2017.  Stable diffuse bladder wall thickening, likely due to prior radiation therapy.   - CKD is been stable with creatinine around 3.  Labs on 10/12/2020 and showed a creatinine of 2.97 -CTAP done on 04/12/2020 was negative for  any metastatic disease. No indication for routine imaging. He can FU with nephrology periodically. Scheduled to see them next week.   3.  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 -He completed radiation therapy on 08/02/2018. -Labs done on  04/2021 showed PSA 0.1  At this time, he feels well, no complaints No major concerns from labs. Ok to FU in 6 months He was encouraged to see [place] sooner if needed.  Benay Pike MD  I spent 30 minutes reviewing his pertinent records, labs, discussion for FU recommendations.  I connected with  Durene Romans on 05/19/21 by a telephone application and verified that I am speaking with the correct person using two identifiers.   I discussed the limitations of evaluation and management by telemedicine. The patient expressed understanding and agreed to proceed. \

## 2021-05-22 ENCOUNTER — Other Ambulatory Visit (HOSPITAL_COMMUNITY): Payer: Self-pay

## 2021-05-22 DIAGNOSIS — C9110 Chronic lymphocytic leukemia of B-cell type not having achieved remission: Secondary | ICD-10-CM

## 2021-05-22 DIAGNOSIS — C649 Malignant neoplasm of unspecified kidney, except renal pelvis: Secondary | ICD-10-CM

## 2021-05-22 DIAGNOSIS — C61 Malignant neoplasm of prostate: Secondary | ICD-10-CM

## 2021-05-24 DIAGNOSIS — D6959 Other secondary thrombocytopenia: Secondary | ICD-10-CM | POA: Diagnosis not present

## 2021-05-24 DIAGNOSIS — N184 Chronic kidney disease, stage 4 (severe): Secondary | ICD-10-CM | POA: Diagnosis not present

## 2021-05-24 DIAGNOSIS — E872 Acidosis: Secondary | ICD-10-CM | POA: Diagnosis not present

## 2021-05-24 DIAGNOSIS — I129 Hypertensive chronic kidney disease with stage 1 through stage 4 chronic kidney disease, or unspecified chronic kidney disease: Secondary | ICD-10-CM | POA: Diagnosis not present

## 2021-05-24 DIAGNOSIS — R809 Proteinuria, unspecified: Secondary | ICD-10-CM | POA: Diagnosis not present

## 2021-05-26 DIAGNOSIS — K1230 Oral mucositis (ulcerative), unspecified: Secondary | ICD-10-CM | POA: Diagnosis not present

## 2021-07-20 DIAGNOSIS — E872 Acidosis: Secondary | ICD-10-CM | POA: Diagnosis not present

## 2021-07-20 DIAGNOSIS — I129 Hypertensive chronic kidney disease with stage 1 through stage 4 chronic kidney disease, or unspecified chronic kidney disease: Secondary | ICD-10-CM | POA: Diagnosis not present

## 2021-07-20 DIAGNOSIS — D6959 Other secondary thrombocytopenia: Secondary | ICD-10-CM | POA: Diagnosis not present

## 2021-07-20 DIAGNOSIS — R809 Proteinuria, unspecified: Secondary | ICD-10-CM | POA: Diagnosis not present

## 2021-07-20 DIAGNOSIS — N184 Chronic kidney disease, stage 4 (severe): Secondary | ICD-10-CM | POA: Diagnosis not present

## 2021-07-26 DIAGNOSIS — N184 Chronic kidney disease, stage 4 (severe): Secondary | ICD-10-CM | POA: Diagnosis not present

## 2021-07-26 DIAGNOSIS — E872 Acidosis: Secondary | ICD-10-CM | POA: Diagnosis not present

## 2021-07-26 DIAGNOSIS — E870 Hyperosmolality and hypernatremia: Secondary | ICD-10-CM | POA: Diagnosis not present

## 2021-07-26 DIAGNOSIS — I129 Hypertensive chronic kidney disease with stage 1 through stage 4 chronic kidney disease, or unspecified chronic kidney disease: Secondary | ICD-10-CM | POA: Diagnosis not present

## 2021-07-26 DIAGNOSIS — R809 Proteinuria, unspecified: Secondary | ICD-10-CM | POA: Diagnosis not present

## 2021-07-26 DIAGNOSIS — E211 Secondary hyperparathyroidism, not elsewhere classified: Secondary | ICD-10-CM | POA: Diagnosis not present

## 2021-08-18 DIAGNOSIS — C642 Malignant neoplasm of left kidney, except renal pelvis: Secondary | ICD-10-CM | POA: Diagnosis not present

## 2021-09-05 DIAGNOSIS — L570 Actinic keratosis: Secondary | ICD-10-CM | POA: Diagnosis not present

## 2021-09-05 DIAGNOSIS — Z85828 Personal history of other malignant neoplasm of skin: Secondary | ICD-10-CM | POA: Diagnosis not present

## 2021-09-29 DIAGNOSIS — I129 Hypertensive chronic kidney disease with stage 1 through stage 4 chronic kidney disease, or unspecified chronic kidney disease: Secondary | ICD-10-CM | POA: Diagnosis not present

## 2021-09-29 DIAGNOSIS — E211 Secondary hyperparathyroidism, not elsewhere classified: Secondary | ICD-10-CM | POA: Diagnosis not present

## 2021-09-29 DIAGNOSIS — R809 Proteinuria, unspecified: Secondary | ICD-10-CM | POA: Diagnosis not present

## 2021-09-29 DIAGNOSIS — N184 Chronic kidney disease, stage 4 (severe): Secondary | ICD-10-CM | POA: Diagnosis not present

## 2021-09-29 DIAGNOSIS — E870 Hyperosmolality and hypernatremia: Secondary | ICD-10-CM | POA: Diagnosis not present

## 2021-10-04 DIAGNOSIS — E211 Secondary hyperparathyroidism, not elsewhere classified: Secondary | ICD-10-CM | POA: Diagnosis not present

## 2021-10-04 DIAGNOSIS — R809 Proteinuria, unspecified: Secondary | ICD-10-CM | POA: Diagnosis not present

## 2021-10-04 DIAGNOSIS — D6959 Other secondary thrombocytopenia: Secondary | ICD-10-CM | POA: Diagnosis not present

## 2021-10-04 DIAGNOSIS — N184 Chronic kidney disease, stage 4 (severe): Secondary | ICD-10-CM | POA: Diagnosis not present

## 2021-10-04 DIAGNOSIS — I129 Hypertensive chronic kidney disease with stage 1 through stage 4 chronic kidney disease, or unspecified chronic kidney disease: Secondary | ICD-10-CM | POA: Diagnosis not present

## 2021-10-04 DIAGNOSIS — C9190 Lymphoid leukemia, unspecified not having achieved remission: Secondary | ICD-10-CM | POA: Diagnosis not present

## 2021-10-04 DIAGNOSIS — E870 Hyperosmolality and hypernatremia: Secondary | ICD-10-CM | POA: Diagnosis not present

## 2021-11-01 DIAGNOSIS — E211 Secondary hyperparathyroidism, not elsewhere classified: Secondary | ICD-10-CM | POA: Diagnosis not present

## 2021-11-01 DIAGNOSIS — R809 Proteinuria, unspecified: Secondary | ICD-10-CM | POA: Diagnosis not present

## 2021-11-01 DIAGNOSIS — I129 Hypertensive chronic kidney disease with stage 1 through stage 4 chronic kidney disease, or unspecified chronic kidney disease: Secondary | ICD-10-CM | POA: Diagnosis not present

## 2021-11-01 DIAGNOSIS — E870 Hyperosmolality and hypernatremia: Secondary | ICD-10-CM | POA: Diagnosis not present

## 2021-11-01 DIAGNOSIS — N184 Chronic kidney disease, stage 4 (severe): Secondary | ICD-10-CM | POA: Diagnosis not present

## 2021-11-16 ENCOUNTER — Inpatient Hospital Stay (HOSPITAL_COMMUNITY): Payer: Medicare Other | Attending: Hematology

## 2021-11-16 ENCOUNTER — Other Ambulatory Visit: Payer: Self-pay

## 2021-11-16 DIAGNOSIS — Z800 Family history of malignant neoplasm of digestive organs: Secondary | ICD-10-CM | POA: Diagnosis not present

## 2021-11-16 DIAGNOSIS — C649 Malignant neoplasm of unspecified kidney, except renal pelvis: Secondary | ICD-10-CM | POA: Diagnosis not present

## 2021-11-16 DIAGNOSIS — C9110 Chronic lymphocytic leukemia of B-cell type not having achieved remission: Secondary | ICD-10-CM | POA: Diagnosis not present

## 2021-11-16 DIAGNOSIS — D696 Thrombocytopenia, unspecified: Secondary | ICD-10-CM | POA: Diagnosis not present

## 2021-11-16 DIAGNOSIS — C61 Malignant neoplasm of prostate: Secondary | ICD-10-CM

## 2021-11-16 DIAGNOSIS — F17210 Nicotine dependence, cigarettes, uncomplicated: Secondary | ICD-10-CM | POA: Diagnosis not present

## 2021-11-16 LAB — PSA: Prostatic Specific Antigen: 0.06 ng/mL (ref 0.00–4.00)

## 2021-11-16 LAB — COMPREHENSIVE METABOLIC PANEL
ALT: 25 U/L (ref 0–44)
AST: 25 U/L (ref 15–41)
Albumin: 4.2 g/dL (ref 3.5–5.0)
Alkaline Phosphatase: 72 U/L (ref 38–126)
Anion gap: 11 (ref 5–15)
BUN: 26 mg/dL — ABNORMAL HIGH (ref 8–23)
CO2: 25 mmol/L (ref 22–32)
Calcium: 9.7 mg/dL (ref 8.9–10.3)
Chloride: 106 mmol/L (ref 98–111)
Creatinine, Ser: 3.19 mg/dL — ABNORMAL HIGH (ref 0.61–1.24)
GFR, Estimated: 20 mL/min — ABNORMAL LOW (ref >60)
Glucose, Bld: 157 mg/dL — ABNORMAL HIGH (ref 70–99)
Potassium: 4.6 mmol/L (ref 3.5–5.1)
Sodium: 142 mmol/L (ref 135–145)
Total Bilirubin: 0.7 mg/dL (ref 0.3–1.2)
Total Protein: 6.7 g/dL (ref 6.5–8.1)

## 2021-11-16 LAB — VITAMIN B12: Vitamin B-12: 686 pg/mL (ref 180–914)

## 2021-11-16 LAB — CBC WITH DIFFERENTIAL/PLATELET
Abs Immature Granulocytes: 0 10*3/uL (ref 0.00–0.07)
Band Neutrophils: 0 %
Basophils Absolute: 0 10*3/uL (ref 0.0–0.1)
Basophils Relative: 0 %
Blasts: 0 %
Eosinophils Absolute: 0.3 10*3/uL (ref 0.0–0.5)
Eosinophils Relative: 1 %
HCT: 47.4 % (ref 39.0–52.0)
Hemoglobin: 15.7 g/dL (ref 13.0–17.0)
Lymphocytes Relative: 74 %
Lymphs Abs: 25.5 10*3/uL — ABNORMAL HIGH (ref 0.7–4.0)
MCH: 33.4 pg (ref 26.0–34.0)
MCHC: 33.1 g/dL (ref 30.0–36.0)
MCV: 100.9 fL — ABNORMAL HIGH (ref 80.0–100.0)
Metamyelocytes Relative: 0 %
Monocytes Absolute: 3.4 10*3/uL — ABNORMAL HIGH (ref 0.1–1.0)
Monocytes Relative: 10 %
Myelocytes: 0 %
Neutro Abs: 5.1 10*3/uL (ref 1.7–7.7)
Neutrophils Relative %: 15 %
Other: 0 %
Platelets: 121 10*3/uL — ABNORMAL LOW (ref 150–400)
Promyelocytes Relative: 0 %
RBC: 4.7 MIL/uL (ref 4.22–5.81)
RDW: 14.8 % (ref 11.5–15.5)
WBC: 34.3 10*3/uL — ABNORMAL HIGH (ref 4.0–10.5)
nRBC: 0 % (ref 0.0–0.2)
nRBC: 0 /100 WBC

## 2021-11-16 LAB — VITAMIN D 25 HYDROXY (VIT D DEFICIENCY, FRACTURES): Vit D, 25-Hydroxy: 38.26 ng/mL (ref 30–100)

## 2021-11-16 LAB — LACTATE DEHYDROGENASE: LDH: 120 U/L (ref 98–192)

## 2021-11-17 LAB — PATHOLOGIST SMEAR REVIEW

## 2021-11-23 ENCOUNTER — Other Ambulatory Visit: Payer: Self-pay

## 2021-11-23 ENCOUNTER — Inpatient Hospital Stay (HOSPITAL_BASED_OUTPATIENT_CLINIC_OR_DEPARTMENT_OTHER): Payer: Medicare Other | Admitting: Hematology

## 2021-11-23 VITALS — BP 128/82 | HR 66 | Temp 97.0°F | Resp 18 | Ht 67.72 in | Wt 163.5 lb

## 2021-11-23 DIAGNOSIS — F17210 Nicotine dependence, cigarettes, uncomplicated: Secondary | ICD-10-CM | POA: Diagnosis not present

## 2021-11-23 DIAGNOSIS — D696 Thrombocytopenia, unspecified: Secondary | ICD-10-CM | POA: Diagnosis not present

## 2021-11-23 DIAGNOSIS — C9110 Chronic lymphocytic leukemia of B-cell type not having achieved remission: Secondary | ICD-10-CM

## 2021-11-23 DIAGNOSIS — Z800 Family history of malignant neoplasm of digestive organs: Secondary | ICD-10-CM | POA: Diagnosis not present

## 2021-11-23 DIAGNOSIS — C649 Malignant neoplasm of unspecified kidney, except renal pelvis: Secondary | ICD-10-CM

## 2021-11-23 DIAGNOSIS — C61 Malignant neoplasm of prostate: Secondary | ICD-10-CM | POA: Diagnosis not present

## 2021-11-23 NOTE — Patient Instructions (Addendum)
[place] at [facility] Discharge Instructions  You were seen and examined today by Dr. Delton Coombes. He reviewed  your most recent labs and everything looks stable. Please keep follow up appointment as scheduled in 6 months.   Thank you for choosing [place] at [facility] to provide your oncology and hematology care.  To afford each patient quality time with our provider, please arrive at least 15 minutes before your scheduled appointment time.   If you have a lab appointment with the [facility] please come in thru the Main Entrance and check in at the main information desk.  You need to re-schedule your appointment should you arrive 10 or more minutes late.  We strive to give you quality time with our providers, and arriving late affects you and other patients whose appointments are after yours.  Also, if you no show three or more times for appointments you may be dismissed from the clinic at the providers discretion.     Again, thank you for choosing [facility].  Our hope is that these requests will decrease the amount of time that you wait before being seen by our physicians.       _____________________________________________________________  Should you have questions after your visit to [facility], please contact our office at [phone] and follow the prompts.  Our office hours are 8:00 a.m. and 4:30 p.m. Monday - Friday.  Please note that voicemails left after 4:00 p.m. may not be returned until the following business day.  We are closed weekends and major holidays.  You do have access to a nurse 24-7, just call the main number to the clinic [phone] and do not press any options, hold on the line and a nurse will answer the phone.    For prescription refill requests, have your pharmacy contact our office and allow 72 hours.    Due to Covid, you will need to wear a mask upon entering the hospital. If you  do not have a mask, a mask will be given to you at the Main Entrance upon arrival. For doctor visits, patients may have 1 support person age 71 or older with them. For treatment visits, patients can not have anyone with them due to social distancing guidelines and our immunocompromised population.

## 2021-11-23 NOTE — Progress Notes (Signed)
[place], [place] [address]   CLINIC:  Medical Oncology/Hematology  PCP:  Celene Squibb, MD [address] [place] [address] [phone]   REASON FOR VISIT:  Follow-up for  CLL, renal cell carcinoma and prostate cancer  CURRENT THERAPY: surveillance  BRIEF ONCOLOGIC HISTORY:  Oncology History   No history exists.    CANCER STAGING:  Cancer Staging  No matching staging information was found for the patient.  INTERVAL HISTORY:  Mr. Keith Rivera, a 71 y.o. male, returns for routine follow-up of his  CLL, renal cell carcinoma and prostate cancer. Keith Rivera was last seen on 10/17/2020.   Today he reports feeling good. He denies fevers, night sweats, recent infections, new pains, and weight loss. He denies ankle swellings.   REVIEW OF SYSTEMS:  Review of Systems  Constitutional:  Negative for appetite change, fatigue, fever and unexpected weight change.  Cardiovascular:  Negative for leg swelling.  Endocrine: Negative for hot flashes.  All other systems reviewed and are negative.  PAST MEDICAL/SURGICAL HISTORY:  Past Medical History:  Diagnosis Date   Anemia    BPH (benign prostatic hyperplasia)    Chronic kidney disease    History of hydronephrosis    Hyperlipidemia    MVP (mitral valve prolapse)    Mild mitral regurgitation 2014   Prostate cancer [facility])    Past Surgical History:  Procedure Laterality Date   CATARACT EXTRACTION W/PHACO Right 11/28/2015   Procedure: CATARACT EXTRACTION PHACO AND INTRAOCULAR LENS PLACEMENT ([place]);  Surgeon: Williams Che, MD;  Location: [facility] ORS;  Service: Ophthalmology;  Laterality: Right;  CDE: 7.14   COLONOSCOPY N/A 02/08/2015   Procedure: COLONOSCOPY;  Surgeon: Daneil Dolin, MD;  Location: [facility] ENDO SUITE;  Service: Endoscopy;  Laterality: N/A;  200   COLONOSCOPY N/A 02/26/2018   Procedure: COLONOSCOPY;  Surgeon: Daneil Dolin, MD;  Location: [facility] ENDO SUITE;  Service: Endoscopy;   Laterality: N/A;  1:00   COLONOSCOPY N/A 04/19/2021   Procedure: COLONOSCOPY;  Surgeon: Daneil Dolin, MD;  Location: [facility] ENDO SUITE;  Service: Endoscopy;  Laterality: N/A;  ASA II / 10:30   CYSTOSCOPY WITH RETROGRADE PYELOGRAM, URETEROSCOPY AND STENT PLACEMENT Bilateral 12/03/2014   Procedure: CYSTOSCOPY WITH BILATERAL RETROGRADE PYELOGRAM, BILATERAL  URETEROSCOPY ;  Surgeon: Claybon Jabs, MD;  Location: [facility] ORS;  Service: Urology;  Laterality: Bilateral;   INGUINAL HERNIA REPAIR Left    LAPAROSCOPIC NEPHRECTOMY Right 02/18/2015   Procedure: LAPAROSCOPIC RIGHT RADICAL NEPHRECTOMY;  Surgeon: Ardis Hughs, MD;  Location: [facility] ORS;  Service: Urology;  Laterality: Right;   NEPHROSTOMY  2006   DUE TO ENLARGED PROSTATE   POLYPECTOMY  02/26/2018   Procedure: POLYPECTOMY;  Surgeon: Daneil Dolin, MD;  Location: [facility] END[facility];  Service: Endoscopy;;  ascending colon   POLYPECTOMY  04/19/2021   Procedure: POLYPECTOMY;  Surgeon: Daneil Dolin, MD;  Location: [facility];  Service: Endoscopy;;   TONSILLECTOMY     TRANSURETHRAL RESECTION OF PROSTATE     VASECTOMY      SOCIAL HISTORY:  Social History   Socioeconomic History   Marital status: Married    Spouse name: Not on file   Number of children: 0   Years of education: Not on file   Highest education level: Not on file  Occupational History   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+  Tobacco Use   Smoking status: Every Day  Packs/day: 1.00    Years: 40.00    Pack years: 40.00    Types: Cigarettes    Start date: 06/29/1971   Smokeless tobacco: Never  Vaping Use   Vaping Use: Never used  Substance and Sexual Activity   Alcohol use: Yes    Alcohol/week: 0.0 standard drinks    Comment: Rare   Drug use: No   Sexual activity: Never    Birth control/protection: None  Other Topics Concern   Not on file  Social History Narrative   Not on file   Social Determinants of Health   Financial Resource  Strain: Not on file  Food Insecurity: Not on file  Transportation Needs: Not on file  Physical Activity: Not on file  Stress: Not on file  Social Connections: Not on file  Intimate Partner Violence: Not on file    FAMILY HISTORY:  Family History  Problem Relation Age of Onset   Heart attack Father        Died age 58   Colon cancer Maternal Grandfather        In his 36s    CURRENT MEDICATIONS:  Current Outpatient Medications  Medication Sig Dispense Refill   atorvastatin (LIPITOR) 20 MG tablet Take 20 mg by mouth at bedtime.     Coenzyme Q10 (CO Q10) 200 MG CAPS Take 200 mg by mouth at bedtime.     Cyanocobalamin (VITAMIN B-12 PO) Take 1,000 mcg by mouth daily.     Multiple Vitamin (MULTIVITAMIN WITH MINERALS) TABS tablet Take 1 tablet by mouth every morning.      sodium bicarbonate 650 MG tablet Take 1,300 mg by mouth 3 (three) times daily.     valsartan (DIOVAN) 40 MG tablet Take 40 mg by mouth daily.     zinc gluconate 50 MG tablet Take 50 mg by mouth daily.     No current facility-administered medications for this visit.    ALLERGIES:  No Known Allergies  PHYSICAL EXAM:  Performance status (ECOG): 0 - Asymptomatic  Vitals:   11/23/21 1452  BP: 128/82  Pulse: 66  Resp: 18  Temp: (!) 97 F (36.1 C)  SpO2: 100%   Wt Readings from Last 3 Encounters:  11/23/21 163 lb 8 oz (74.2 kg)  04/28/21 156 lb (70.8 kg)  02/06/21 161 lb 6.4 oz (73.2 kg)   Physical Exam Vitals reviewed.  Constitutional:      Appearance: Normal appearance.  Cardiovascular:     Rate and Rhythm: Normal rate and regular rhythm.     Pulses: Normal pulses.     Heart sounds: Normal heart sounds.  Pulmonary:     Effort: Pulmonary effort is normal.     Breath sounds: Normal breath sounds.  Abdominal:     Palpations: Abdomen is soft. There is no hepatomegaly, splenomegaly or mass.     Tenderness: There is no abdominal tenderness.  Musculoskeletal:     Right lower leg: No edema.     Left  lower leg: No edema.  Lymphadenopathy:     Cervical: No cervical adenopathy.     Right cervical: No superficial, deep or posterior cervical adenopathy.    Left cervical: No superficial, deep or posterior cervical adenopathy.     Upper Body:     Right upper body: No supraclavicular, axillary or pectoral adenopathy.     Left upper body: No supraclavicular, axillary or pectoral adenopathy.     Lower Body: No right inguinal adenopathy. No left inguinal adenopathy.  Neurological:  General: No focal deficit present.     Mental Status: He is alert and oriented to person, place, and time.  Psychiatric:        Mood and Affect: Mood normal.        Behavior: Behavior normal.     LABORATORY DATA:  I have reviewed the labs as listed.  CBC Latest Ref Rng & Units 11/16/2021 05/11/2021 10/12/2020  WBC 4.0 - 10.5 K/uL 34.3(H) 35.1(H) 34.9(H)  Hemoglobin 13.0 - 17.0 g/dL 15.7 16.2 15.5  Hematocrit 39.0 - 52.0 % 47.4 50.6 47.9  Platelets 150 - 400 K/uL 121(L) 120(L) 199   CMP Latest Ref Rng & Units 11/16/2021 05/11/2021 10/12/2020  Glucose 70 - 99 mg/dL 157(H) 96 136(H)  BUN 8 - 23 mg/dL 26(H) 29(H) 28(H)  Creatinine 0.61 - 1.24 mg/dL 3.19(H) 3.19(H) 2.97(H)  Sodium 135 - 145 mmol/L 142 142 141  Potassium 3.5 - 5.1 mmol/L 4.6 4.2 4.7  Chloride 98 - 111 mmol/L 106 107 105  CO2 22 - 32 mmol/L 25 27 25   Calcium 8.9 - 10.3 mg/dL 9.7 9.8 9.6  Total Protein 6.5 - 8.1 g/dL 6.7 6.9 6.8  Total Bilirubin 0.3 - 1.2 mg/dL 0.7 1.0 0.6  Alkaline Phos 38 - 126 U/L 72 73 76  AST 15 - 41 U/L 25 22 24   ALT 0 - 44 U/L 25 23 22     DIAGNOSTIC IMAGING:  I have independently reviewed the scans and discussed with the patient. No results found.   ASSESSMENT:  1.  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  Jak 2 and BCR/ABL negative.     2.  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  This was noted on review of chart after patient left.  - He follows up with Dr. Louis Meckel.  3.  Prostate cancer: - T2a adenocarcinoma the prostate, Gleason 3+4, PSA 8.8. - IMRT 70 Gray in 28 fractions from 06/24/2018 - 08/01/2018.   PLAN:  1.  Stage 0 CLL by Rai system: - He does not have any B symptoms.  No recurrent infections. - Physical examination today did not reveal any palpable adenopathy or splenomegaly. - Reviewed labs from 11/16/2021 which showed white count 34.3, hemoglobin 15.7 and platelet count 121. - He has mild thrombocytopenia since May 2019 which is stable. - Reviewed labs from 11/16/2021.  White count is stable at 34.3.  Differential shows predominantly lymphocytes.  Hemoglobin is normal.  Vitamin D, B12 and LDH were normal. - Recommend follow-up in 6 months with repeat labs.    2.  Stage I (PT1ANX) papillary renal cell carcinoma: - No clinical signs or symptoms of recurrence at this time.  Continue follow-up with Dr. Louis Meckel.  3.  Prostate cancer: - PSA 0.06.    Orders placed this encounter:  No orders of the defined types were placed in this encounter.    Derek Jack, MD [place] [phone]   I, Thana Ates, am acting as a scribe for Dr. Derek Jack.  I, Derek Jack MD, have reviewed the above documentation for accuracy and completeness, and I agree with the above.

## 2021-12-07 DIAGNOSIS — E870 Hyperosmolality and hypernatremia: Secondary | ICD-10-CM | POA: Diagnosis not present

## 2021-12-07 DIAGNOSIS — I129 Hypertensive chronic kidney disease with stage 1 through stage 4 chronic kidney disease, or unspecified chronic kidney disease: Secondary | ICD-10-CM | POA: Diagnosis not present

## 2021-12-07 DIAGNOSIS — N184 Chronic kidney disease, stage 4 (severe): Secondary | ICD-10-CM | POA: Diagnosis not present

## 2021-12-07 DIAGNOSIS — E211 Secondary hyperparathyroidism, not elsewhere classified: Secondary | ICD-10-CM | POA: Diagnosis not present

## 2021-12-07 DIAGNOSIS — R809 Proteinuria, unspecified: Secondary | ICD-10-CM | POA: Diagnosis not present

## 2021-12-13 DIAGNOSIS — I129 Hypertensive chronic kidney disease with stage 1 through stage 4 chronic kidney disease, or unspecified chronic kidney disease: Secondary | ICD-10-CM | POA: Diagnosis not present

## 2021-12-13 DIAGNOSIS — D6959 Other secondary thrombocytopenia: Secondary | ICD-10-CM | POA: Diagnosis not present

## 2021-12-13 DIAGNOSIS — R809 Proteinuria, unspecified: Secondary | ICD-10-CM | POA: Diagnosis not present

## 2021-12-13 DIAGNOSIS — N184 Chronic kidney disease, stage 4 (severe): Secondary | ICD-10-CM | POA: Diagnosis not present

## 2021-12-13 DIAGNOSIS — C9190 Lymphoid leukemia, unspecified not having achieved remission: Secondary | ICD-10-CM | POA: Diagnosis not present

## 2022-02-05 DIAGNOSIS — I129 Hypertensive chronic kidney disease with stage 1 through stage 4 chronic kidney disease, or unspecified chronic kidney disease: Secondary | ICD-10-CM | POA: Diagnosis not present

## 2022-02-05 DIAGNOSIS — N184 Chronic kidney disease, stage 4 (severe): Secondary | ICD-10-CM | POA: Diagnosis not present

## 2022-02-05 DIAGNOSIS — D6959 Other secondary thrombocytopenia: Secondary | ICD-10-CM | POA: Diagnosis not present

## 2022-02-05 DIAGNOSIS — C9190 Lymphoid leukemia, unspecified not having achieved remission: Secondary | ICD-10-CM | POA: Diagnosis not present

## 2022-02-05 DIAGNOSIS — R809 Proteinuria, unspecified: Secondary | ICD-10-CM | POA: Diagnosis not present

## 2022-02-07 DIAGNOSIS — H353131 Nonexudative age-related macular degeneration, bilateral, early dry stage: Secondary | ICD-10-CM | POA: Diagnosis not present

## 2022-02-08 DIAGNOSIS — C9190 Lymphoid leukemia, unspecified not having achieved remission: Secondary | ICD-10-CM | POA: Diagnosis not present

## 2022-02-08 DIAGNOSIS — D6959 Other secondary thrombocytopenia: Secondary | ICD-10-CM | POA: Diagnosis not present

## 2022-02-08 DIAGNOSIS — R809 Proteinuria, unspecified: Secondary | ICD-10-CM | POA: Diagnosis not present

## 2022-02-08 DIAGNOSIS — I129 Hypertensive chronic kidney disease with stage 1 through stage 4 chronic kidney disease, or unspecified chronic kidney disease: Secondary | ICD-10-CM | POA: Diagnosis not present

## 2022-02-08 DIAGNOSIS — E870 Hyperosmolality and hypernatremia: Secondary | ICD-10-CM | POA: Diagnosis not present

## 2022-02-08 DIAGNOSIS — E8722 Chronic metabolic acidosis: Secondary | ICD-10-CM | POA: Diagnosis not present

## 2022-02-08 DIAGNOSIS — N184 Chronic kidney disease, stage 4 (severe): Secondary | ICD-10-CM | POA: Diagnosis not present

## 2022-04-16 DIAGNOSIS — N184 Chronic kidney disease, stage 4 (severe): Secondary | ICD-10-CM | POA: Diagnosis not present

## 2022-04-16 DIAGNOSIS — R809 Proteinuria, unspecified: Secondary | ICD-10-CM | POA: Diagnosis not present

## 2022-04-16 DIAGNOSIS — D6959 Other secondary thrombocytopenia: Secondary | ICD-10-CM | POA: Diagnosis not present

## 2022-04-16 DIAGNOSIS — I129 Hypertensive chronic kidney disease with stage 1 through stage 4 chronic kidney disease, or unspecified chronic kidney disease: Secondary | ICD-10-CM | POA: Diagnosis not present

## 2022-04-16 DIAGNOSIS — C9190 Lymphoid leukemia, unspecified not having achieved remission: Secondary | ICD-10-CM | POA: Diagnosis not present

## 2022-04-19 DIAGNOSIS — C9190 Lymphoid leukemia, unspecified not having achieved remission: Secondary | ICD-10-CM | POA: Diagnosis not present

## 2022-04-19 DIAGNOSIS — E211 Secondary hyperparathyroidism, not elsewhere classified: Secondary | ICD-10-CM | POA: Diagnosis not present

## 2022-04-19 DIAGNOSIS — I129 Hypertensive chronic kidney disease with stage 1 through stage 4 chronic kidney disease, or unspecified chronic kidney disease: Secondary | ICD-10-CM | POA: Diagnosis not present

## 2022-04-19 DIAGNOSIS — R809 Proteinuria, unspecified: Secondary | ICD-10-CM | POA: Diagnosis not present

## 2022-04-19 DIAGNOSIS — N184 Chronic kidney disease, stage 4 (severe): Secondary | ICD-10-CM | POA: Diagnosis not present

## 2022-04-19 DIAGNOSIS — D6959 Other secondary thrombocytopenia: Secondary | ICD-10-CM | POA: Diagnosis not present

## 2022-05-09 DIAGNOSIS — E782 Mixed hyperlipidemia: Secondary | ICD-10-CM | POA: Diagnosis not present

## 2022-05-09 DIAGNOSIS — N184 Chronic kidney disease, stage 4 (severe): Secondary | ICD-10-CM | POA: Diagnosis not present

## 2022-05-09 DIAGNOSIS — R7301 Impaired fasting glucose: Secondary | ICD-10-CM | POA: Diagnosis not present

## 2022-05-10 DIAGNOSIS — D696 Thrombocytopenia, unspecified: Secondary | ICD-10-CM | POA: Diagnosis not present

## 2022-05-10 DIAGNOSIS — N184 Chronic kidney disease, stage 4 (severe): Secondary | ICD-10-CM | POA: Diagnosis not present

## 2022-05-10 DIAGNOSIS — C9190 Lymphoid leukemia, unspecified not having achieved remission: Secondary | ICD-10-CM | POA: Diagnosis not present

## 2022-05-10 DIAGNOSIS — M545 Low back pain, unspecified: Secondary | ICD-10-CM | POA: Diagnosis not present

## 2022-05-10 DIAGNOSIS — F17200 Nicotine dependence, unspecified, uncomplicated: Secondary | ICD-10-CM | POA: Diagnosis not present

## 2022-05-10 DIAGNOSIS — E782 Mixed hyperlipidemia: Secondary | ICD-10-CM | POA: Diagnosis not present

## 2022-05-10 DIAGNOSIS — C641 Malignant neoplasm of right kidney, except renal pelvis: Secondary | ICD-10-CM | POA: Diagnosis not present

## 2022-05-17 ENCOUNTER — Inpatient Hospital Stay (HOSPITAL_COMMUNITY): Payer: Medicare Other | Attending: Hematology

## 2022-05-17 DIAGNOSIS — C641 Malignant neoplasm of right kidney, except renal pelvis: Secondary | ICD-10-CM | POA: Diagnosis not present

## 2022-05-17 DIAGNOSIS — N189 Chronic kidney disease, unspecified: Secondary | ICD-10-CM | POA: Diagnosis not present

## 2022-05-17 DIAGNOSIS — F17210 Nicotine dependence, cigarettes, uncomplicated: Secondary | ICD-10-CM | POA: Insufficient documentation

## 2022-05-17 DIAGNOSIS — C9110 Chronic lymphocytic leukemia of B-cell type not having achieved remission: Secondary | ICD-10-CM | POA: Insufficient documentation

## 2022-05-17 DIAGNOSIS — Z800 Family history of malignant neoplasm of digestive organs: Secondary | ICD-10-CM | POA: Insufficient documentation

## 2022-05-17 DIAGNOSIS — C61 Malignant neoplasm of prostate: Secondary | ICD-10-CM | POA: Diagnosis not present

## 2022-05-17 DIAGNOSIS — C649 Malignant neoplasm of unspecified kidney, except renal pelvis: Secondary | ICD-10-CM

## 2022-05-17 LAB — COMPREHENSIVE METABOLIC PANEL
ALT: 18 U/L (ref 0–44)
AST: 18 U/L (ref 15–41)
Albumin: 4.1 g/dL (ref 3.5–5.0)
Alkaline Phosphatase: 67 U/L (ref 38–126)
Anion gap: 8 (ref 5–15)
BUN: 32 mg/dL — ABNORMAL HIGH (ref 8–23)
CO2: 22 mmol/L (ref 22–32)
Calcium: 9.4 mg/dL (ref 8.9–10.3)
Chloride: 108 mmol/L (ref 98–111)
Creatinine, Ser: 3.55 mg/dL — ABNORMAL HIGH (ref 0.61–1.24)
GFR, Estimated: 17 mL/min — ABNORMAL LOW (ref >60)
Glucose, Bld: 136 mg/dL — ABNORMAL HIGH (ref 70–99)
Potassium: 4.4 mmol/L (ref 3.5–5.1)
Sodium: 138 mmol/L (ref 135–145)
Total Bilirubin: 0.7 mg/dL (ref 0.3–1.2)
Total Protein: 6.8 g/dL (ref 6.5–8.1)

## 2022-05-17 LAB — CBC WITH DIFFERENTIAL/PLATELET
Abs Immature Granulocytes: 0 10*3/uL (ref 0.00–0.07)
Basophils Absolute: 0 10*3/uL (ref 0.0–0.1)
Basophils Relative: 0 %
Eosinophils Absolute: 0.3 10*3/uL (ref 0.0–0.5)
Eosinophils Relative: 1 %
HCT: 43.4 % (ref 39.0–52.0)
Hemoglobin: 14.2 g/dL (ref 13.0–17.0)
Lymphocytes Relative: 79 %
Lymphs Abs: 26.9 10*3/uL — ABNORMAL HIGH (ref 0.7–4.0)
MCH: 32.3 pg (ref 26.0–34.0)
MCHC: 32.7 g/dL (ref 30.0–36.0)
MCV: 98.9 fL (ref 80.0–100.0)
Monocytes Absolute: 2.7 10*3/uL — ABNORMAL HIGH (ref 0.1–1.0)
Monocytes Relative: 8 %
Neutro Abs: 4.1 10*3/uL (ref 1.7–7.7)
Neutrophils Relative %: 12 %
Platelets: 130 10*3/uL — ABNORMAL LOW (ref 150–400)
RBC: 4.39 MIL/uL (ref 4.22–5.81)
RDW: 14.6 % (ref 11.5–15.5)
WBC Morphology: ABNORMAL
WBC: 34.1 10*3/uL — ABNORMAL HIGH (ref 4.0–10.5)
nRBC: 0 % (ref 0.0–0.2)

## 2022-05-17 LAB — PSA: Prostatic Specific Antigen: 0.11 ng/mL (ref 0.00–4.00)

## 2022-05-17 LAB — LACTATE DEHYDROGENASE: LDH: 117 U/L (ref 98–192)

## 2022-05-24 ENCOUNTER — Inpatient Hospital Stay (HOSPITAL_BASED_OUTPATIENT_CLINIC_OR_DEPARTMENT_OTHER): Payer: Medicare Other | Admitting: Hematology

## 2022-05-24 VITALS — BP 100/68 | HR 96 | Temp 97.6°F | Resp 18 | Ht 69.29 in | Wt 155.1 lb

## 2022-05-24 DIAGNOSIS — C61 Malignant neoplasm of prostate: Secondary | ICD-10-CM

## 2022-05-24 DIAGNOSIS — C649 Malignant neoplasm of unspecified kidney, except renal pelvis: Secondary | ICD-10-CM

## 2022-05-24 DIAGNOSIS — Z800 Family history of malignant neoplasm of digestive organs: Secondary | ICD-10-CM | POA: Diagnosis not present

## 2022-05-24 DIAGNOSIS — N189 Chronic kidney disease, unspecified: Secondary | ICD-10-CM | POA: Diagnosis not present

## 2022-05-24 DIAGNOSIS — C9110 Chronic lymphocytic leukemia of B-cell type not having achieved remission: Secondary | ICD-10-CM

## 2022-05-24 DIAGNOSIS — F17210 Nicotine dependence, cigarettes, uncomplicated: Secondary | ICD-10-CM | POA: Diagnosis not present

## 2022-05-24 DIAGNOSIS — C641 Malignant neoplasm of right kidney, except renal pelvis: Secondary | ICD-10-CM | POA: Diagnosis not present

## 2022-05-24 NOTE — Progress Notes (Signed)
[place], [place] [address]   CLINIC:  Medical Oncology/Hematology  PCP:  Celene Squibb, MD [address] [place] [place] [address] [phone]   REASON FOR VISIT:  Follow-up for CLL, renal cell carcinoma and prostate cancer  CURRENT THERAPY: surveillance  INTERVAL HISTORY:  Mr. Keith Rivera, a 71 y.o. male, returns for routine follow-up of his CLL, renal cell carcinoma and prostate cancer. [place] was last seen on 11/23/2021.   Today he reports feeling good. He denies fevers, night sweats, infections, and new pains. He has lost 8 lbs since his last visit.   REVIEW OF SYSTEMS:  Review of Systems  Constitutional:  Negative for appetite change, fatigue and fever. Unexpected weight change: -8 lbs. Musculoskeletal:  Negative for arthralgias.  All other systems reviewed and are negative.   PAST MEDICAL/SURGICAL HISTORY:  Past Medical History:  Diagnosis Date   Anemia    BPH (benign prostatic hyperplasia)    Chronic kidney disease    History of hydronephrosis    Hyperlipidemia    MVP (mitral valve prolapse)    Mild mitral regurgitation 2014   Prostate cancer [facility])    Past Surgical History:  Procedure Laterality Date   CATARACT EXTRACTION W/PHACO Right 11/28/2015   Procedure: CATARACT EXTRACTION PHACO AND INTRAOCULAR LENS PLACEMENT ([place]);  Surgeon: Williams Che, MD;  Location: [facility] ORS;  Service: Ophthalmology;  Laterality: Right;  CDE: 7.14   COLONOSCOPY N/A 02/08/2015   Procedure: COLONOSCOPY;  Surgeon: Daneil Dolin, MD;  Location: [facility];  Service: Endoscopy;  Laterality: N/A;  200   COLONOSCOPY N/A 02/26/2018   Procedure: COLONOSCOPY;  Surgeon: Daneil Dolin, MD;  Location: [facility];  Service: Endoscopy;  Laterality: N/A;  1:00   COLONOSCOPY N/A 04/19/2021   Procedure: COLONOSCOPY;  Surgeon: Daneil Dolin, MD;  Location: [facility];  Service: Endoscopy;  Laterality: N/A;  ASA II / 10:30   CYSTOSCOPY WITH  RETROGRADE PYELOGRAM, URETEROSCOPY AND STENT PLACEMENT Bilateral 12/03/2014   Procedure: CYSTOSCOPY WITH BILATERAL RETROGRADE PYELOGRAM, BILATERAL  URETEROSCOPY ;  Surgeon: Claybon Jabs, MD;  Location: [facility];  Service: Urology;  Laterality: Bilateral;   INGUINAL HERNIA REPAIR Left    LAPAROSCOPIC NEPHRECTOMY Right 02/18/2015   Procedure: LAPAROSCOPIC RIGHT RADICAL NEPHRECTOMY;  Surgeon: Ardis Hughs, MD;  Location: [facility];  Service: Urology;  Laterality: Right;   NEPHROSTOMY  2006   DUE TO ENLARGED PROSTATE   POLYPECTOMY  02/26/2018   Procedure: POLYPECTOMY;  Surgeon: Daneil Dolin, MD;  Location: [facility];  Service: Endoscopy;;  ascending colon   POLYPECTOMY  04/19/2021   Procedure: POLYPECTOMY;  Surgeon: Daneil Dolin, MD;  Location: [facility];  Service: Endoscopy;;   TONSILLECTOMY     TRANSURETHRAL RESECTION OF PROSTATE     VASECTOMY      SOCIAL HISTORY:  Social History   Socioeconomic History   Marital status: Married    Spouse name: Not on file   Number of children: 0   Years of education: Not on file   Highest education level: Not on file  Occupational History   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+  Tobacco Use   Smoking status: Every Day    Packs/day: 1.00    Years: 40.00    Total pack years: 40.00    Types: Cigarettes    Start date: 06/29/1971   Smokeless tobacco: Never  Vaping Use  Vaping Use: Never used  Substance and Sexual Activity   Alcohol use: Yes    Alcohol/week: 0.0 standard drinks of alcohol    Comment: Rare   Drug use: No   Sexual activity: Never    Birth control/protection: None  Other Topics Concern   Not on file  Social History Narrative   Not on file   Social Determinants of Health   Financial Resource Strain: Low Risk  (09/27/2017)   Overall Financial Resource Strain (CARDIA)    Difficulty of Paying Living Expenses: Not hard at all  Food Insecurity: No Food Insecurity (09/27/2017)   Hunger  Vital Sign    Worried About Running Out of Food in the Last Year: Never true    Ran Out of Food in the Last Year: Never true  Transportation Needs: No Transportation Needs (09/27/2017)   PRAPARE - Hydrologist (Medical): No    Lack of Transportation (Non-Medical): No  Physical Activity: Sufficiently Active (09/27/2017)   Exercise Vital Sign    Days of Exercise per Week: 7 days    Minutes of Exercise per Session: 30 min  Stress: No Stress Concern Present (09/27/2017)   [place]    Feeling of Stress : Not at all  Social Connections: Somewhat Isolated (09/27/2017)   Social Connection and Isolation Panel [NHANES]    Frequency of Communication with Friends and Family: Never    Frequency of Social Gatherings with Friends and Family: Never    Attends Religious Services: Never    Marine scientist or Organizations: Yes    Attends Music therapist: More than 4 times per year    Marital Status: Married  Human resources officer Violence: Not At Risk (09/27/2017)   Humiliation, Afraid, Rape, and Kick questionnaire    Fear of Current or Ex-Partner: No    Emotionally Abused: No    Physically Abused: No    Sexually Abused: No    FAMILY HISTORY:  Family History  Problem Relation Age of Onset   Heart attack Father        Died age 23   Colon cancer Maternal Grandfather        In his 18s    CURRENT MEDICATIONS:  Current Outpatient Medications  Medication Sig Dispense Refill   atorvastatin (LIPITOR) 20 MG tablet Take 20 mg by mouth at bedtime.     Coenzyme Q10 (CO Q10) 200 MG CAPS Take 200 mg by mouth at bedtime.     Cyanocobalamin (VITAMIN B-12 PO) Take 1,000 mcg by mouth daily.     Multiple Vitamin (MULTIVITAMIN WITH MINERALS) TABS tablet Take 1 tablet by mouth every morning.      sodium bicarbonate 650 MG tablet Take 1,300 mg by mouth 3 (three) times daily.     valsartan (DIOVAN)  40 MG tablet Take 40 mg by mouth daily.     zinc gluconate 50 MG tablet Take 50 mg by mouth daily.     No current facility-administered medications for this visit.    ALLERGIES:  No Known Allergies  PHYSICAL EXAM:  Performance status (ECOG): 0 - Asymptomatic  There were no vitals filed for this visit. Wt Readings from Last 3 Encounters:  11/23/21 163 lb 8 oz (74.2 kg)  04/28/21 156 lb (70.8 kg)  02/06/21 161 lb 6.4 oz (73.2 kg)   Physical Exam Vitals reviewed.  Constitutional:      Appearance: Normal appearance.  Cardiovascular:  Rate and Rhythm: Normal rate and regular rhythm.     Pulses: Normal pulses.     Heart sounds: Normal heart sounds.  Pulmonary:     Effort: Pulmonary effort is normal.     Breath sounds: Normal breath sounds.  Abdominal:     Palpations: Abdomen is soft. There is no hepatomegaly, splenomegaly or mass.     Tenderness: There is no abdominal tenderness.  Musculoskeletal:     Right lower leg: No edema.     Left lower leg: No edema.  Neurological:     General: No focal deficit present.     Mental Status: He is alert and oriented to person, place, and time.  Psychiatric:        Mood and Affect: Mood normal.        Behavior: Behavior normal.      LABORATORY DATA:  I have reviewed the labs as listed.     Latest Ref Rng & Units 05/17/2022    1:24 PM 11/16/2021    1:20 PM 05/11/2021    8:58 AM  CBC  WBC 4.0 - 10.5 K/uL 34.1  34.3  35.1   Hemoglobin 13.0 - 17.0 g/dL 14.2  15.7  16.2   Hematocrit 39.0 - 52.0 % 43.4  47.4  50.6   Platelets 150 - 400 K/uL 130  121  120       Latest Ref Rng & Units 05/17/2022    1:24 PM 11/16/2021    1:20 PM 05/11/2021    8:58 AM  CMP  Glucose 70 - 99 mg/dL 136  157  96   BUN 8 - 23 mg/dL 32  26  29   Creatinine 0.61 - 1.24 mg/dL 3.55  3.19  3.19   Sodium 135 - 145 mmol/L 138  142  142   Potassium 3.5 - 5.1 mmol/L 4.4  4.6  4.2   Chloride 98 - 111 mmol/L 108  106  107   CO2 22 - 32 mmol/L '22  25  27    '$ Calcium 8.9 - 10.3 mg/dL 9.4  9.7  9.8   Total Protein 6.5 - 8.1 g/dL 6.8  6.7  6.9   Total Bilirubin 0.3 - 1.2 mg/dL 0.7  0.7  1.0   Alkaline Phos 38 - 126 U/L 67  72  73   AST 15 - 41 U/L '18  25  22   '$ ALT 0 - 44 U/L '18  25  23     '$ DIAGNOSTIC IMAGING:  I have independently reviewed the scans and discussed with the patient. No results found.   ASSESSMENT:  1.  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  Jak 2 and BCR/ABL negative.     2.  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  This was noted on review of chart after patient left.  - He follows up with Dr. Louis Meckel.  3.  Prostate cancer: - T2a adenocarcinoma the prostate, Gleason 3+4, PSA 8.8. - IMRT 70 Gray in 28 fractions from 06/24/2018 - 08/01/2018.   PLAN:  1.  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  No recurrent infections. - Physical examination today did not reveal any palpable adenopathy or splenomegaly. -  However he lost about 8 pounds since last visit.  He reportedly has weight fluctuations during summer months. - Reviewed his labs today which showed white count is stable at 34 with stable differential.  Platelet count is 130 and hemoglobin normal.  LDH was normal.  Creatinine has gotten slightly worse at 3.55.  Continue follow-up with Dr. Theador Hawthorne. - No indication for treatment at this time.  RTC 6 months with repeat labs and exam.     2.  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 Louis Meckel.  3.  Prostate cancer: - Continue follow-up with Dr. Louis Meckel.   Orders placed this encounter:  No orders of the defined types were placed in this encounter.    Derek Jack,  MD [place] [phone]   I, Thana Ates, am acting as a scribe for Dr. Derek Jack.  I, Derek Jack MD, have reviewed the above documentation for accuracy and completeness, and I agree with the above.

## 2022-05-24 NOTE — Patient Instructions (Addendum)
[place] at [facility] Discharge Instructions  You were seen and examined today by Dr. Delton Coombes.  Dr. Delton Coombes discussed your most recent lab work and everything looks good.  Follow-up as scheduled in 6 months.    Thank you for choosing [place] at [facility] to provide your oncology and hematology care.  To afford each patient quality time with our provider, please arrive at least 15 minutes before your scheduled appointment time.   If you have a lab appointment with the [place] please come in thru the Main Entrance and check in at the main information desk.  You need to re-schedule your appointment should you arrive 10 or more minutes late.  We strive to give you quality time with our providers, and arriving late affects you and other patients whose appointments are after yours.  Also, if you no show three or more times for appointments you may be dismissed from the clinic at the providers discretion.     Again, thank you for choosing [facility].  Our hope is that these requests will decrease the amount of time that you wait before being seen by our physicians.       _____________________________________________________________  Should you have questions after your visit to [facility], please contact our office at [phone] and follow the prompts.  Our office hours are 8:00 a.m. and 4:30 p.m. Monday - Friday.  Please note that voicemails left after 4:00 p.m. may not be returned until the following business day.  We are closed weekends and major holidays.  You do have access to a nurse 24-7, just call the main number to the clinic [phone] and do not press any options, hold on the line and a nurse will answer the phone.    For prescription refill requests, have your pharmacy contact our office and allow 72 hours.

## 2022-06-20 DIAGNOSIS — L299 Pruritus, unspecified: Secondary | ICD-10-CM | POA: Diagnosis not present

## 2022-06-20 DIAGNOSIS — L03119 Cellulitis of unspecified part of limb: Secondary | ICD-10-CM | POA: Diagnosis not present

## 2022-06-20 DIAGNOSIS — W57XXXA Bitten or stung by nonvenomous insect and other nonvenomous arthropods, initial encounter: Secondary | ICD-10-CM | POA: Diagnosis not present

## 2022-06-21 DIAGNOSIS — R809 Proteinuria, unspecified: Secondary | ICD-10-CM | POA: Diagnosis not present

## 2022-06-21 DIAGNOSIS — E211 Secondary hyperparathyroidism, not elsewhere classified: Secondary | ICD-10-CM | POA: Diagnosis not present

## 2022-06-21 DIAGNOSIS — C9190 Lymphoid leukemia, unspecified not having achieved remission: Secondary | ICD-10-CM | POA: Diagnosis not present

## 2022-06-21 DIAGNOSIS — I129 Hypertensive chronic kidney disease with stage 1 through stage 4 chronic kidney disease, or unspecified chronic kidney disease: Secondary | ICD-10-CM | POA: Diagnosis not present

## 2022-06-21 DIAGNOSIS — N184 Chronic kidney disease, stage 4 (severe): Secondary | ICD-10-CM | POA: Diagnosis not present

## 2022-06-27 DIAGNOSIS — E211 Secondary hyperparathyroidism, not elsewhere classified: Secondary | ICD-10-CM | POA: Diagnosis not present

## 2022-06-27 DIAGNOSIS — C9190 Lymphoid leukemia, unspecified not having achieved remission: Secondary | ICD-10-CM | POA: Diagnosis not present

## 2022-06-27 DIAGNOSIS — I129 Hypertensive chronic kidney disease with stage 1 through stage 4 chronic kidney disease, or unspecified chronic kidney disease: Secondary | ICD-10-CM | POA: Diagnosis not present

## 2022-06-27 DIAGNOSIS — N184 Chronic kidney disease, stage 4 (severe): Secondary | ICD-10-CM | POA: Diagnosis not present

## 2022-06-27 DIAGNOSIS — E8722 Chronic metabolic acidosis: Secondary | ICD-10-CM | POA: Diagnosis not present

## 2022-06-27 DIAGNOSIS — D6959 Other secondary thrombocytopenia: Secondary | ICD-10-CM | POA: Diagnosis not present

## 2022-06-27 DIAGNOSIS — R809 Proteinuria, unspecified: Secondary | ICD-10-CM | POA: Diagnosis not present

## 2022-08-17 DIAGNOSIS — Z0000 Encounter for general adult medical examination without abnormal findings: Secondary | ICD-10-CM | POA: Diagnosis not present

## 2022-08-17 DIAGNOSIS — Z23 Encounter for immunization: Secondary | ICD-10-CM | POA: Diagnosis not present

## 2022-08-22 DIAGNOSIS — R809 Proteinuria, unspecified: Secondary | ICD-10-CM | POA: Diagnosis not present

## 2022-08-22 DIAGNOSIS — N184 Chronic kidney disease, stage 4 (severe): Secondary | ICD-10-CM | POA: Diagnosis not present

## 2022-08-22 DIAGNOSIS — C9190 Lymphoid leukemia, unspecified not having achieved remission: Secondary | ICD-10-CM | POA: Diagnosis not present

## 2022-08-22 DIAGNOSIS — I129 Hypertensive chronic kidney disease with stage 1 through stage 4 chronic kidney disease, or unspecified chronic kidney disease: Secondary | ICD-10-CM | POA: Diagnosis not present

## 2022-08-22 DIAGNOSIS — E211 Secondary hyperparathyroidism, not elsewhere classified: Secondary | ICD-10-CM | POA: Diagnosis not present

## 2022-08-28 DIAGNOSIS — E870 Hyperosmolality and hypernatremia: Secondary | ICD-10-CM | POA: Diagnosis not present

## 2022-08-28 DIAGNOSIS — D6959 Other secondary thrombocytopenia: Secondary | ICD-10-CM | POA: Diagnosis not present

## 2022-08-28 DIAGNOSIS — N184 Chronic kidney disease, stage 4 (severe): Secondary | ICD-10-CM | POA: Diagnosis not present

## 2022-08-28 DIAGNOSIS — C9190 Lymphoid leukemia, unspecified not having achieved remission: Secondary | ICD-10-CM | POA: Diagnosis not present

## 2022-08-28 DIAGNOSIS — I129 Hypertensive chronic kidney disease with stage 1 through stage 4 chronic kidney disease, or unspecified chronic kidney disease: Secondary | ICD-10-CM | POA: Diagnosis not present

## 2022-08-28 DIAGNOSIS — R809 Proteinuria, unspecified: Secondary | ICD-10-CM | POA: Diagnosis not present

## 2022-08-28 DIAGNOSIS — E211 Secondary hyperparathyroidism, not elsewhere classified: Secondary | ICD-10-CM | POA: Diagnosis not present

## 2022-09-03 DIAGNOSIS — D239 Other benign neoplasm of skin, unspecified: Secondary | ICD-10-CM | POA: Diagnosis not present

## 2022-09-03 DIAGNOSIS — L570 Actinic keratosis: Secondary | ICD-10-CM | POA: Diagnosis not present

## 2022-11-01 DIAGNOSIS — H6122 Impacted cerumen, left ear: Secondary | ICD-10-CM | POA: Diagnosis not present

## 2022-11-01 DIAGNOSIS — R0981 Nasal congestion: Secondary | ICD-10-CM | POA: Diagnosis not present

## 2022-11-01 DIAGNOSIS — J019 Acute sinusitis, unspecified: Secondary | ICD-10-CM | POA: Diagnosis not present

## 2022-11-01 DIAGNOSIS — R059 Cough, unspecified: Secondary | ICD-10-CM | POA: Diagnosis not present

## 2022-11-02 DIAGNOSIS — N184 Chronic kidney disease, stage 4 (severe): Secondary | ICD-10-CM | POA: Diagnosis not present

## 2022-11-02 DIAGNOSIS — I129 Hypertensive chronic kidney disease with stage 1 through stage 4 chronic kidney disease, or unspecified chronic kidney disease: Secondary | ICD-10-CM | POA: Diagnosis not present

## 2022-11-02 DIAGNOSIS — D6959 Other secondary thrombocytopenia: Secondary | ICD-10-CM | POA: Diagnosis not present

## 2022-11-02 DIAGNOSIS — R809 Proteinuria, unspecified: Secondary | ICD-10-CM | POA: Diagnosis not present

## 2022-11-02 DIAGNOSIS — C9190 Lymphoid leukemia, unspecified not having achieved remission: Secondary | ICD-10-CM | POA: Diagnosis not present

## 2022-11-07 DIAGNOSIS — E8722 Chronic metabolic acidosis: Secondary | ICD-10-CM | POA: Diagnosis not present

## 2022-11-07 DIAGNOSIS — R809 Proteinuria, unspecified: Secondary | ICD-10-CM | POA: Diagnosis not present

## 2022-11-07 DIAGNOSIS — N184 Chronic kidney disease, stage 4 (severe): Secondary | ICD-10-CM | POA: Diagnosis not present

## 2022-11-07 DIAGNOSIS — E211 Secondary hyperparathyroidism, not elsewhere classified: Secondary | ICD-10-CM | POA: Diagnosis not present

## 2022-11-07 DIAGNOSIS — I129 Hypertensive chronic kidney disease with stage 1 through stage 4 chronic kidney disease, or unspecified chronic kidney disease: Secondary | ICD-10-CM | POA: Diagnosis not present

## 2022-11-07 DIAGNOSIS — C9190 Lymphoid leukemia, unspecified not having achieved remission: Secondary | ICD-10-CM | POA: Diagnosis not present

## 2022-11-22 ENCOUNTER — Inpatient Hospital Stay: Payer: Medicare Other

## 2022-11-29 ENCOUNTER — Ambulatory Visit: Payer: Medicare Other | Admitting: Hematology

## 2022-12-05 ENCOUNTER — Inpatient Hospital Stay: Payer: Medicare Other | Attending: Hematology

## 2022-12-05 DIAGNOSIS — Z800 Family history of malignant neoplasm of digestive organs: Secondary | ICD-10-CM | POA: Insufficient documentation

## 2022-12-05 DIAGNOSIS — Z85528 Personal history of other malignant neoplasm of kidney: Secondary | ICD-10-CM | POA: Insufficient documentation

## 2022-12-05 DIAGNOSIS — Z905 Acquired absence of kidney: Secondary | ICD-10-CM | POA: Diagnosis not present

## 2022-12-05 DIAGNOSIS — C61 Malignant neoplasm of prostate: Secondary | ICD-10-CM | POA: Insufficient documentation

## 2022-12-05 DIAGNOSIS — N189 Chronic kidney disease, unspecified: Secondary | ICD-10-CM | POA: Diagnosis not present

## 2022-12-05 DIAGNOSIS — D696 Thrombocytopenia, unspecified: Secondary | ICD-10-CM | POA: Insufficient documentation

## 2022-12-05 DIAGNOSIS — F17210 Nicotine dependence, cigarettes, uncomplicated: Secondary | ICD-10-CM | POA: Insufficient documentation

## 2022-12-05 DIAGNOSIS — C649 Malignant neoplasm of unspecified kidney, except renal pelvis: Secondary | ICD-10-CM

## 2022-12-05 DIAGNOSIS — C9110 Chronic lymphocytic leukemia of B-cell type not having achieved remission: Secondary | ICD-10-CM | POA: Diagnosis not present

## 2022-12-05 LAB — CBC WITH DIFFERENTIAL/PLATELET
Abs Immature Granulocytes: 0 10*3/uL (ref 0.00–0.07)
Basophils Absolute: 0.9 10*3/uL — ABNORMAL HIGH (ref 0.0–0.1)
Basophils Relative: 2 %
Eosinophils Absolute: 0.4 10*3/uL (ref 0.0–0.5)
Eosinophils Relative: 1 %
HCT: 46.8 % (ref 39.0–52.0)
Hemoglobin: 15.2 g/dL (ref 13.0–17.0)
Lymphocytes Relative: 76 %
Lymphs Abs: 32.5 10*3/uL — ABNORMAL HIGH (ref 0.7–4.0)
MCH: 32.6 pg (ref 26.0–34.0)
MCHC: 32.5 g/dL (ref 30.0–36.0)
MCV: 100.4 fL — ABNORMAL HIGH (ref 80.0–100.0)
Monocytes Absolute: 5.6 10*3/uL — ABNORMAL HIGH (ref 0.1–1.0)
Monocytes Relative: 13 %
Neutro Abs: 3.4 10*3/uL (ref 1.7–7.7)
Neutrophils Relative %: 8 %
Platelets: 127 10*3/uL — ABNORMAL LOW (ref 150–400)
RBC: 4.66 MIL/uL (ref 4.22–5.81)
RDW: 15.5 % (ref 11.5–15.5)
WBC: 42.7 10*3/uL — ABNORMAL HIGH (ref 4.0–10.5)
nRBC: 0 % (ref 0.0–0.2)

## 2022-12-05 LAB — COMPREHENSIVE METABOLIC PANEL
ALT: 19 U/L (ref 0–44)
AST: 27 U/L (ref 15–41)
Albumin: 4.1 g/dL (ref 3.5–5.0)
Alkaline Phosphatase: 85 U/L (ref 38–126)
Anion gap: 9 (ref 5–15)
BUN: 27 mg/dL — ABNORMAL HIGH (ref 8–23)
CO2: 21 mmol/L — ABNORMAL LOW (ref 22–32)
Calcium: 9.5 mg/dL (ref 8.9–10.3)
Chloride: 108 mmol/L (ref 98–111)
Creatinine, Ser: 3.24 mg/dL — ABNORMAL HIGH (ref 0.61–1.24)
GFR, Estimated: 20 mL/min — ABNORMAL LOW (ref >60)
Glucose, Bld: 86 mg/dL (ref 70–99)
Potassium: 4.5 mmol/L (ref 3.5–5.1)
Sodium: 138 mmol/L (ref 135–145)
Total Bilirubin: 0.9 mg/dL (ref 0.3–1.2)
Total Protein: 7.2 g/dL (ref 6.5–8.1)

## 2022-12-05 LAB — LACTATE DEHYDROGENASE: LDH: 139 U/L (ref 98–192)

## 2022-12-12 ENCOUNTER — Inpatient Hospital Stay (HOSPITAL_BASED_OUTPATIENT_CLINIC_OR_DEPARTMENT_OTHER): Payer: Medicare Other | Admitting: Hematology

## 2022-12-12 ENCOUNTER — Encounter: Payer: Self-pay | Admitting: Hematology

## 2022-12-12 VITALS — BP 106/72 | HR 87 | Temp 98.7°F | Resp 16 | Wt 154.7 lb

## 2022-12-12 DIAGNOSIS — C649 Malignant neoplasm of unspecified kidney, except renal pelvis: Secondary | ICD-10-CM

## 2022-12-12 DIAGNOSIS — D696 Thrombocytopenia, unspecified: Secondary | ICD-10-CM | POA: Diagnosis not present

## 2022-12-12 DIAGNOSIS — F17210 Nicotine dependence, cigarettes, uncomplicated: Secondary | ICD-10-CM | POA: Diagnosis not present

## 2022-12-12 DIAGNOSIS — C9110 Chronic lymphocytic leukemia of B-cell type not having achieved remission: Secondary | ICD-10-CM

## 2022-12-12 DIAGNOSIS — N189 Chronic kidney disease, unspecified: Secondary | ICD-10-CM | POA: Diagnosis not present

## 2022-12-12 DIAGNOSIS — Z905 Acquired absence of kidney: Secondary | ICD-10-CM | POA: Diagnosis not present

## 2022-12-12 DIAGNOSIS — Z85528 Personal history of other malignant neoplasm of kidney: Secondary | ICD-10-CM | POA: Diagnosis not present

## 2022-12-12 DIAGNOSIS — C61 Malignant neoplasm of prostate: Secondary | ICD-10-CM

## 2022-12-12 DIAGNOSIS — Z800 Family history of malignant neoplasm of digestive organs: Secondary | ICD-10-CM | POA: Diagnosis not present

## 2022-12-12 NOTE — Patient Instructions (Signed)
[place]  Discharge Instructions  You were seen and examined today by Dr. Delton Coombes.  Dr. Delton Coombes discussed your most recent lab work which revealed that everything looks good.  Any drenching night sweats, low grade fevers having almost everyday, losing weight without trying more than 10% of body weight please call our office to see Dr. Delton Coombes sooner.  Follow-up as scheduled in 6 months.    Thank you for choosing [place] to provide your oncology and hematology care.   To afford each patient quality time with our provider, please arrive at least 15 minutes before your scheduled appointment time. You may need to reschedule your appointment if you arrive late (10 or more minutes). Arriving late affects you and other patients whose appointments are after yours.  Also, if you miss three or more appointments without notifying the office, you may be dismissed from the clinic at the provider's discretion.    Again, thank you for choosing [facility].  Our hope is that these requests will decrease the amount of time that you wait before being seen by our physicians.   If you have a lab appointment with the [facility] please come in thru the Main Entrance and check in at the main information desk.           _____________________________________________________________  Should you have questions after your visit to [facility], please contact our office at [phone] and follow the prompts.  Our office hours are 8:00 a.m. to 4:30 p.m. Monday - Thursday and 8:00 a.m. to 2:30 p.m. Friday.  Please note that voicemails left after 4:00 p.m. may not be returned until the following business day.  We are closed weekends and all major holidays.  You do have access to a nurse 24-7, just call the main number to the clinic [phone] and do not press any options, hold on the line and a nurse will answer the  phone.    For prescription refill requests, have your pharmacy contact our office and allow 72 hours.    Masks are optional in the cancer centers. If you would like for your care team to wear a mask while they are taking care of you, please let them know. You may have one support person who is at least 73 years old accompany you for your appointments.

## 2022-12-12 NOTE — Progress Notes (Signed)
[facility] [address] [place] [address]    Clinic Day:  12/12/2022  Referring physician: Celene Squibb, MD  Patient Care Team: Celene Squibb, MD as PCP - General (Internal Medicine) Kathie Rhodes, MD (Inactive) as Consulting Physician (Urology) Ardis Hughs, MD as Attending Physician (Urology) Gala Romney Cristopher Estimable, MD as Consulting Physician (Gastroenterology)   ASSESSMENT & PLAN:   Assessment: 1.  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  Jak 2 and BCR/ABL negative.     2.  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  This was noted on review of chart after patient left.  - He follows up with Dr. Louis Meckel.  3.  Prostate cancer: - T2a adenocarcinoma the prostate, Gleason 3+4, PSA 8.8. - IMRT 70 Gray in 28 fractions from 06/24/2018 - 08/01/2018.    Plan:  1.  Stage 0 CLL by system: - He denies any B symptoms.  No recurrent infections. - Physical exam today did not reveal any palpable lymphadenopathy or splenomegaly. - Reviewed labs from 12/05/2022 which showed normal LFTs.  Creatinine is mildly improved at 3.24.  LDH is normal. - White count increased to 42.7, predominantly neutrophils.  Previously 34.1.  Hemoglobin is normal.  Mild thrombocytopenia is also stable. - Continue monitoring in 6 months with repeat labs and exam.   Orders Placed This Encounter  Procedures   CBC with Differential/Platelet    Standing Status:   Future    Standing Expiration Date:   12/13/2023    Order Specific Question:   Release to patient    Answer:   Immediate   Comprehensive metabolic panel    Standing Status:   Future    Standing Expiration Date:   12/13/2023    Order Specific  Question:   Release to patient    Answer:   Immediate   Lactate dehydrogenase    Standing Status:   Future    Standing Expiration Date:   12/13/2023    Order Specific Question:   Release to patient    Answer:   Immediate      Kaleen Odea as a scribe for Derek Jack, MD.,have documented all relevant documentation on the behalf of Derek Jack, MD,as directed by  Derek Jack, MD while in the presence of Derek Jack, MD.   I, Derek Jack MD, have reviewed the above documentation for accuracy and completeness, and I agree with the above.   Doyce Loose   2/14/20243:37 PM  CHIEF COMPLAINT:   Diagnosis: CLL, renal cell carcinoma and prostate cancer    Cancer Staging  No matching staging information was found for the patient.   Prior Therapy: None  Current Therapy:  surveillance    HISTORY OF PRESENT ILLNESS:   Oncology History   No history exists.     INTERVAL HISTORY:   Keith Rivera is a 72 y.o. male presenting to clinic today for follow up of CLL, renal cell carcinoma and prostate cancer. He was last seen by me on 05/24/2022.  Today, he states that he is doing well overall. His appetite level is at 50%. His energy level is at 50%.  He denies any fever night sweats recent infections nausea vomiting. He lost 1lb unintentionally since  his last visit.    PAST MEDICAL HISTORY:   Past Medical History: Past Medical History:  Diagnosis Date   Anemia    BPH (benign prostatic hyperplasia)    Chronic kidney disease    History of hydronephrosis    Hyperlipidemia    MVP (mitral valve prolapse)    Mild mitral regurgitation 2014   Prostate cancer [facility])     Surgical History: Past Surgical History:  Procedure Laterality Date   CATARACT EXTRACTION W/PHACO Right 11/28/2015   Procedure: CATARACT EXTRACTION PHACO AND INTRAOCULAR LENS PLACEMENT ([place]);  Surgeon: Williams Che, MD;  Location: [facility];  Service: Ophthalmology;   Laterality: Right;  CDE: 7.14   COLONOSCOPY N/A 02/08/2015   Procedure: COLONOSCOPY;  Surgeon: Daneil Dolin, MD;  Location: [facility];  Service: Endoscopy;  Laterality: N/A;  200   COLONOSCOPY N/A 02/26/2018   Procedure: COLONOSCOPY;  Surgeon: Daneil Dolin, MD;  Location: [facility];  Service: Endoscopy;  Laterality: N/A;  1:00   COLONOSCOPY N/A 04/19/2021   Procedure: COLONOSCOPY;  Surgeon: Daneil Dolin, MD;  Location: [facility];  Service: Endoscopy;  Laterality: N/A;  ASA II / 10:30   CYSTOSCOPY WITH RETROGRADE PYELOGRAM, URETEROSCOPY AND STENT PLACEMENT Bilateral 12/03/2014   Procedure: CYSTOSCOPY WITH BILATERAL RETROGRADE PYELOGRAM, BILATERAL  URETEROSCOPY ;  Surgeon: Claybon Jabs, MD;  Location: [facility];  Service: Urology;  Laterality: Bilateral;   INGUINAL HERNIA REPAIR Left    LAPAROSCOPIC NEPHRECTOMY Right 02/18/2015   Procedure: LAPAROSCOPIC RIGHT RADICAL NEPHRECTOMY;  Surgeon: Ardis Hughs, MD;  Location: [facility];  Service: Urology;  Laterality: Right;   NEPHROSTOMY  2006   DUE TO ENLARGED PROSTATE   POLYPECTOMY  02/26/2018   Procedure: POLYPECTOMY;  Surgeon: Daneil Dolin, MD;  Location: [facility];  Service: Endoscopy;;  ascending colon   POLYPECTOMY  04/19/2021   Procedure: POLYPECTOMY;  Surgeon: Daneil Dolin, MD;  Location: [facility];  Service: Endoscopy;;   TONSILLECTOMY     TRANSURETHRAL RESECTION OF PROSTATE     VASECTOMY      Social History: Social History   Socioeconomic History   Marital status: Married    Spouse name: Not on file   Number of children: 0   Years of education: Not on file   Highest education level: Not on file  Occupational History   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+  Tobacco Use   Smoking status: Every Day    Packs/day: 1.00    Years: 40.00    Total pack years: 40.00    Types: Cigarettes    Start date: 06/29/1971   Smokeless tobacco: Never  Vaping Use   Vaping Use: Never used   Substance and Sexual Activity   Alcohol use: Yes    Alcohol/week: 0.0 standard drinks of alcohol    Comment: Rare   Drug use: No   Sexual activity: Never    Birth control/protection: None  Other Topics Concern   Not on file  Social History Narrative   Not on file   Social Determinants of Health   Financial Resource Strain: Low Risk  (09/27/2017)   Overall Financial Resource Strain (CARDIA)    Difficulty of Paying Living Expenses: Not hard at all  Food Insecurity: No Food Insecurity (09/27/2017)   Hunger Vital Sign    Worried About Running Out of Food in the Last Year: Never true    Ran Out of Food in the Last Year:  Never true  Transportation Needs: No Transportation Needs (09/27/2017)   PRAPARE - Hydrologist (Medical): No    Lack of Transportation (Non-Medical): No  Physical Activity: Sufficiently Active (09/27/2017)   Exercise Vital Sign    Days of Exercise per Week: 7 days    Minutes of Exercise per Session: 30 min  Stress: No Stress Concern Present (09/27/2017)   [place]    Feeling of Stress : Not at all  Social Connections: Somewhat Isolated (09/27/2017)   Social Connection and Isolation Panel [NHANES]    Frequency of Communication with Friends and Family: Never    Frequency of Social Gatherings with Friends and Family: Never    Attends Religious Services: Never    Marine scientist or Organizations: Yes    Attends Music therapist: More than 4 times per year    Marital Status: Married  Human resources officer Violence: Not At Risk (09/27/2017)   Humiliation, Afraid, Rape, and Kick questionnaire    Fear of Current or Ex-Partner: No    Emotionally Abused: No    Physically Abused: No    Sexually Abused: No    Family History: Family History  Problem Relation Age of Onset   Heart attack Father        Died age 55   Colon cancer Maternal Grandfather         In his 22s    Current Medications:  Current Outpatient Medications:    atorvastatin (LIPITOR) 20 MG tablet, Take 20 mg by mouth at bedtime., Disp: , Rfl:    Coenzyme Q10 (CO Q10) 200 MG CAPS, Take 200 mg by mouth at bedtime., Disp: , Rfl:    Cyanocobalamin (VITAMIN B-12 PO), Take 1,000 mcg by mouth daily., Disp: , Rfl:    Multiple Vitamin (MULTIVITAMIN WITH MINERALS) TABS tablet, Take 1 tablet by mouth every morning. , Disp: , Rfl:    sodium bicarbonate 650 MG tablet, Take 1,300 mg by mouth 3 (three) times daily., Disp: , Rfl:    valsartan (DIOVAN) 40 MG tablet, Take 40 mg by mouth daily., Disp: , Rfl:    zinc gluconate 50 MG tablet, Take 50 mg by mouth daily., Disp: , Rfl:    Allergies: No Known Allergies  REVIEW OF SYSTEMS:   Review of Systems  Constitutional:  Negative for chills, fatigue and fever.  HENT:   Negative for lump/mass, mouth sores, nosebleeds, sore throat and trouble swallowing.   Eyes:  Negative for eye problems.  Respiratory:  Negative for cough and shortness of breath.   Cardiovascular:  Negative for chest pain, leg swelling and palpitations.  Gastrointestinal:  Negative for abdominal pain, constipation, diarrhea, nausea and vomiting.  Genitourinary:  Negative for bladder incontinence, difficulty urinating, dysuria, frequency, hematuria and nocturia.   Musculoskeletal:  Negative for arthralgias, back pain, flank pain, myalgias and neck pain.  Skin:  Negative for itching and rash.  Neurological:  Negative for dizziness, headaches and numbness.  Hematological:  Does not bruise/bleed easily.  Psychiatric/Behavioral:  Negative for depression, sleep disturbance and suicidal ideas. The patient is not nervous/anxious.   All other systems reviewed and are negative.    VITALS:   Blood pressure 106/72, pulse 87, temperature 98.7 F (37.1 C), temperature source Oral, resp. rate 16, weight 154 lb 11.2 oz (70.2 kg), SpO2 99 %.  Wt Readings from Last 3 Encounters:   12/12/22 154 lb 11.2 oz (70.2 kg)  05/24/22 155  lb 1.6 oz (70.4 kg)  11/23/21 163 lb 8 oz (74.2 kg)    Body mass index is 22.65 kg/m.  Performance status (ECOG): 1 - Symptomatic but completely ambulatory  PHYSICAL EXAM:   Physical Exam Vitals and nursing note reviewed. Exam conducted with a chaperone present.  Constitutional:      Appearance: Normal appearance.  Cardiovascular:     Rate and Rhythm: Normal rate and regular rhythm.     Pulses: Normal pulses.     Heart sounds: Normal heart sounds.  Pulmonary:     Effort: Pulmonary effort is normal.     Breath sounds: Normal breath sounds.  Abdominal:     Palpations: Abdomen is soft. There is no hepatomegaly, splenomegaly or mass.     Tenderness: There is no abdominal tenderness.  Musculoskeletal:     Right lower leg: No edema.     Left lower leg: No edema.  Lymphadenopathy:     Cervical: No cervical adenopathy.     Right cervical: No superficial, deep or posterior cervical adenopathy.    Left cervical: No superficial, deep or posterior cervical adenopathy.     Upper Body:     Right upper body: No supraclavicular or axillary adenopathy.     Left upper body: No supraclavicular or axillary adenopathy.  Neurological:     General: No focal deficit present.     Mental Status: He is alert and oriented to person, place, and time.  Psychiatric:        Mood and Affect: Mood normal.        Behavior: Behavior normal.     LABS:      Latest Ref Rng & Units 12/05/2022    1:15 PM 05/17/2022    1:24 PM 11/16/2021    1:20 PM  CBC  WBC 4.0 - 10.5 K/uL 42.7  34.1  34.3   Hemoglobin 13.0 - 17.0 g/dL 15.2  14.2  15.7   Hematocrit 39.0 - 52.0 % 46.8  43.4  47.4   Platelets 150 - 400 K/uL 127  130  121       Latest Ref Rng & Units 12/05/2022    1:15 PM 05/17/2022    1:24 PM 11/16/2021    1:20 PM  CMP  Glucose 70 - 99 mg/dL 86  136  157   BUN 8 - 23 mg/dL 27  32  26   Creatinine 0.61 - 1.24 mg/dL 3.24  3.55  3.19   Sodium 135 - 145  mmol/L 138  138  142   Potassium 3.5 - 5.1 mmol/L 4.5  4.4  4.6   Chloride 98 - 111 mmol/L 108  108  106   CO2 22 - 32 mmol/L 21  22  25   $ Calcium 8.9 - 10.3 mg/dL 9.5  9.4  9.7   Total Protein 6.5 - 8.1 g/dL 7.2  6.8  6.7   Total Bilirubin 0.3 - 1.2 mg/dL 0.9  0.7  0.7   Alkaline Phos 38 - 126 U/L 85  67  72   AST 15 - 41 U/L 27  18  25   $ ALT 0 - 44 U/L 19  18  25      $ No results found for: "CEA1", "CEA" / No results found for: "CEA1", "CEA" No results found for: "PSA1" No results found for: "[id number]" No results found for: "CAN125"  No results found for: "TOTALPROTELP", "ALBUMINELP", "A1GS", "A2GS", "BETS", "BETA2SER", "GAMS", "MSPIKE", "SPEI" No results found for: "TIBC", "FERRITIN", "IRONPCTSAT" Lab  Results  Component Value Date   LDH 139 12/05/2022   LDH 117 05/17/2022   LDH 120 11/16/2021     STUDIES:   No results found.

## 2022-12-24 DIAGNOSIS — Z961 Presence of intraocular lens: Secondary | ICD-10-CM | POA: Diagnosis not present

## 2022-12-24 DIAGNOSIS — H2512 Age-related nuclear cataract, left eye: Secondary | ICD-10-CM | POA: Diagnosis not present

## 2022-12-24 DIAGNOSIS — H35371 Puckering of macula, right eye: Secondary | ICD-10-CM | POA: Diagnosis not present

## 2023-01-16 ENCOUNTER — Telehealth: Payer: Self-pay | Admitting: Internal Medicine

## 2023-01-16 NOTE — Telephone Encounter (Signed)
Patient isn't due for repeat TCS until 03/2026, this would have been sent back to his PCP advising them of this. We cannot schedule repeat TCS until then

## 2023-01-16 NOTE — Telephone Encounter (Signed)
Patient left a message asking if we had received a referral for him.  I do see a referral for colonoscopy but don't see where a letter was mailed..... does he need an ov before scheduling due to age?

## 2023-02-08 DIAGNOSIS — E211 Secondary hyperparathyroidism, not elsewhere classified: Secondary | ICD-10-CM | POA: Diagnosis not present

## 2023-02-08 DIAGNOSIS — R809 Proteinuria, unspecified: Secondary | ICD-10-CM | POA: Diagnosis not present

## 2023-02-08 DIAGNOSIS — C9190 Lymphoid leukemia, unspecified not having achieved remission: Secondary | ICD-10-CM | POA: Diagnosis not present

## 2023-02-08 DIAGNOSIS — I129 Hypertensive chronic kidney disease with stage 1 through stage 4 chronic kidney disease, or unspecified chronic kidney disease: Secondary | ICD-10-CM | POA: Diagnosis not present

## 2023-02-08 DIAGNOSIS — N184 Chronic kidney disease, stage 4 (severe): Secondary | ICD-10-CM | POA: Diagnosis not present

## 2023-03-14 DIAGNOSIS — L0231 Cutaneous abscess of buttock: Secondary | ICD-10-CM | POA: Diagnosis not present

## 2023-03-14 DIAGNOSIS — L0291 Cutaneous abscess, unspecified: Secondary | ICD-10-CM | POA: Diagnosis not present

## 2023-03-29 DIAGNOSIS — W57XXXA Bitten or stung by nonvenomous insect and other nonvenomous arthropods, initial encounter: Secondary | ICD-10-CM | POA: Diagnosis not present

## 2023-03-29 DIAGNOSIS — Z6821 Body mass index (BMI) 21.0-21.9, adult: Secondary | ICD-10-CM | POA: Diagnosis not present

## 2023-03-29 DIAGNOSIS — L03115 Cellulitis of right lower limb: Secondary | ICD-10-CM | POA: Diagnosis not present

## 2023-04-08 DIAGNOSIS — H353131 Nonexudative age-related macular degeneration, bilateral, early dry stage: Secondary | ICD-10-CM | POA: Diagnosis not present

## 2023-04-09 DIAGNOSIS — Z8619 Personal history of other infectious and parasitic diseases: Secondary | ICD-10-CM | POA: Diagnosis not present

## 2023-04-09 DIAGNOSIS — Z09 Encounter for follow-up examination after completed treatment for conditions other than malignant neoplasm: Secondary | ICD-10-CM | POA: Diagnosis not present

## 2023-05-08 DIAGNOSIS — M545 Low back pain, unspecified: Secondary | ICD-10-CM | POA: Diagnosis not present

## 2023-05-23 DIAGNOSIS — Z139 Encounter for screening, unspecified: Secondary | ICD-10-CM | POA: Diagnosis not present

## 2023-05-23 DIAGNOSIS — E782 Mixed hyperlipidemia: Secondary | ICD-10-CM | POA: Diagnosis not present

## 2023-05-29 DIAGNOSIS — D696 Thrombocytopenia, unspecified: Secondary | ICD-10-CM | POA: Diagnosis not present

## 2023-05-29 DIAGNOSIS — C9190 Lymphoid leukemia, unspecified not having achieved remission: Secondary | ICD-10-CM | POA: Diagnosis not present

## 2023-05-29 DIAGNOSIS — E782 Mixed hyperlipidemia: Secondary | ICD-10-CM | POA: Diagnosis not present

## 2023-05-29 DIAGNOSIS — G8929 Other chronic pain: Secondary | ICD-10-CM | POA: Diagnosis not present

## 2023-05-29 DIAGNOSIS — M545 Low back pain, unspecified: Secondary | ICD-10-CM | POA: Diagnosis not present

## 2023-05-29 DIAGNOSIS — C641 Malignant neoplasm of right kidney, except renal pelvis: Secondary | ICD-10-CM | POA: Diagnosis not present

## 2023-05-29 DIAGNOSIS — A6920 Lyme disease, unspecified: Secondary | ICD-10-CM | POA: Diagnosis not present

## 2023-05-29 DIAGNOSIS — F17210 Nicotine dependence, cigarettes, uncomplicated: Secondary | ICD-10-CM | POA: Diagnosis not present

## 2023-05-29 DIAGNOSIS — Z0000 Encounter for general adult medical examination without abnormal findings: Secondary | ICD-10-CM | POA: Diagnosis not present

## 2023-05-29 DIAGNOSIS — N184 Chronic kidney disease, stage 4 (severe): Secondary | ICD-10-CM | POA: Diagnosis not present

## 2023-05-30 DIAGNOSIS — N189 Chronic kidney disease, unspecified: Secondary | ICD-10-CM | POA: Diagnosis not present

## 2023-05-30 DIAGNOSIS — N183 Chronic kidney disease, stage 3 unspecified: Secondary | ICD-10-CM | POA: Diagnosis not present

## 2023-05-30 DIAGNOSIS — D631 Anemia in chronic kidney disease: Secondary | ICD-10-CM | POA: Diagnosis not present

## 2023-05-30 DIAGNOSIS — R809 Proteinuria, unspecified: Secondary | ICD-10-CM | POA: Diagnosis not present

## 2023-06-07 DIAGNOSIS — C9110 Chronic lymphocytic leukemia of B-cell type not having achieved remission: Secondary | ICD-10-CM | POA: Diagnosis not present

## 2023-06-07 DIAGNOSIS — D696 Thrombocytopenia, unspecified: Secondary | ICD-10-CM | POA: Diagnosis not present

## 2023-06-07 DIAGNOSIS — R809 Proteinuria, unspecified: Secondary | ICD-10-CM | POA: Diagnosis not present

## 2023-06-07 DIAGNOSIS — Z905 Acquired absence of kidney: Secondary | ICD-10-CM | POA: Diagnosis not present

## 2023-06-12 ENCOUNTER — Inpatient Hospital Stay: Payer: Medicare Other | Attending: Hematology

## 2023-06-12 DIAGNOSIS — Z905 Acquired absence of kidney: Secondary | ICD-10-CM | POA: Diagnosis not present

## 2023-06-12 DIAGNOSIS — Z800 Family history of malignant neoplasm of digestive organs: Secondary | ICD-10-CM | POA: Insufficient documentation

## 2023-06-12 DIAGNOSIS — C9110 Chronic lymphocytic leukemia of B-cell type not having achieved remission: Secondary | ICD-10-CM | POA: Diagnosis not present

## 2023-06-12 DIAGNOSIS — Z85528 Personal history of other malignant neoplasm of kidney: Secondary | ICD-10-CM | POA: Insufficient documentation

## 2023-06-12 DIAGNOSIS — C649 Malignant neoplasm of unspecified kidney, except renal pelvis: Secondary | ICD-10-CM

## 2023-06-12 DIAGNOSIS — C61 Malignant neoplasm of prostate: Secondary | ICD-10-CM | POA: Diagnosis not present

## 2023-06-12 DIAGNOSIS — F17210 Nicotine dependence, cigarettes, uncomplicated: Secondary | ICD-10-CM | POA: Diagnosis not present

## 2023-06-12 LAB — CBC WITH DIFFERENTIAL/PLATELET
Abs Immature Granulocytes: 0 10*3/uL (ref 0.00–0.07)
Basophils Absolute: 0 10*3/uL (ref 0.0–0.1)
Basophils Relative: 0 %
Eosinophils Absolute: 0 10*3/uL (ref 0.0–0.5)
Eosinophils Relative: 0 %
HCT: 41.7 % (ref 39.0–52.0)
Hemoglobin: 13.9 g/dL (ref 13.0–17.0)
Lymphocytes Relative: 87 %
Lymphs Abs: 43.5 10*3/uL — ABNORMAL HIGH (ref 0.7–4.0)
MCH: 33.6 pg (ref 26.0–34.0)
MCHC: 33.3 g/dL (ref 30.0–36.0)
MCV: 100.7 fL — ABNORMAL HIGH (ref 80.0–100.0)
Monocytes Absolute: 3 10*3/uL — ABNORMAL HIGH (ref 0.1–1.0)
Monocytes Relative: 6 %
Neutro Abs: 3.5 10*3/uL (ref 1.7–7.7)
Neutrophils Relative %: 7 %
Platelets: 129 10*3/uL — ABNORMAL LOW (ref 150–400)
RBC: 4.14 MIL/uL — ABNORMAL LOW (ref 4.22–5.81)
RDW: 15.9 % — ABNORMAL HIGH (ref 11.5–15.5)
WBC: 50 10*3/uL — ABNORMAL HIGH (ref 4.0–10.5)
nRBC: 0 % (ref 0.0–0.2)

## 2023-06-12 LAB — COMPREHENSIVE METABOLIC PANEL
ALT: 20 U/L (ref 0–44)
AST: 20 U/L (ref 15–41)
Albumin: 3.9 g/dL (ref 3.5–5.0)
Alkaline Phosphatase: 78 U/L (ref 38–126)
Anion gap: 9 (ref 5–15)
BUN: 33 mg/dL — ABNORMAL HIGH (ref 8–23)
CO2: 22 mmol/L (ref 22–32)
Calcium: 9.2 mg/dL (ref 8.9–10.3)
Chloride: 107 mmol/L (ref 98–111)
Creatinine, Ser: 3.19 mg/dL — ABNORMAL HIGH (ref 0.61–1.24)
GFR, Estimated: 20 mL/min — ABNORMAL LOW (ref >60)
Glucose, Bld: 106 mg/dL — ABNORMAL HIGH (ref 70–99)
Potassium: 4.1 mmol/L (ref 3.5–5.1)
Sodium: 138 mmol/L (ref 135–145)
Total Bilirubin: 0.5 mg/dL (ref 0.3–1.2)
Total Protein: 6.5 g/dL (ref 6.5–8.1)

## 2023-06-12 LAB — LACTATE DEHYDROGENASE: LDH: 112 U/L (ref 98–192)

## 2023-06-18 NOTE — Progress Notes (Signed)
[facility] [address], [place] [address]    Clinic Day:  06/19/2023  Referring physician: Benita Stabile, MD  Patient Care Team: Benita Stabile, MD as PCP - General (Internal Medicine) Ihor Gully, MD (Inactive) as Consulting Physician (Urology) Crist Fat, MD as Attending Physician (Urology) Jena Gauss Gerrit Friends, MD as Consulting Physician (Gastroenterology)   ASSESSMENT & PLAN:   Assessment: 1.  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  Jak 2 and BCR/ABL negative.   2.  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  This was noted on review of chart after patient left.  - He follows up with Dr. Marlou Porch.  3.  Prostate cancer: - T2a adenocarcinoma the prostate, Gleason 3+4, PSA 8.8. - IMRT 70 Gray in 28 fractions from 06/24/2018 - 08/01/2018.    Plan: 1.  Stage 0 CLL by system: - Denies any infections. - No B symptoms in the last 6 months. - Physical exam: No palpable lymphadenopathy or splenomegaly. - Reviewed labs from 06/12/2023: LDH normal.  White count 50K, PLT-129.  Hemoglobin normal at 13.9.  Creatinine stable at 3.19.  LFTs normal. - No indication for treatment at this time.  RTC 6 months for follow-up with repeat labs.  Will also check anemia panel as his hemoglobin dropped by 1 point since last visit..    Orders Placed This Encounter  Procedures   CBC with Differential/Platelet    Standing Status:   Future    Standing Expiration Date:   06/18/2024    Order Specific Question:   Release to patient    Answer:   Immediate   Comprehensive metabolic panel    Standing Status:   Future    Standing Expiration Date:   06/18/2024    Order Specific Question:   Release  to patient    Answer:   Immediate   Lactate dehydrogenase    Standing Status:   Future    Standing Expiration Date:   06/18/2024    Order Specific Question:   Release to patient    Answer:   Immediate   Ferritin    Standing Status:   Future    Standing Expiration Date:   06/18/2024    Order Specific Question:   Release to patient    Answer:   Immediate   Iron and TIBC    Standing Status:   Future    Standing Expiration Date:   06/18/2024    Order Specific Question:   Release to patient    Answer:   Immediate   Folate    Standing Status:   Future    Standing Expiration Date:   06/18/2024    Order Specific Question:   Release to patient    Answer:   Immediate   Vitamin B12    Standing Status:   Future    Standing Expiration Date:   06/18/2024    Order Specific Question:   Release to patient    Answer:   Immediate      I,Katie Daubenspeck,acting as a scribe for [name], MD.,have documented all relevant documentation on the behalf of [name], MD,as directed by  [name], MD while in the presence of [name], MD.  I, [name] MD, have reviewed the above documentation for accuracy and completeness, and I agree with the above.   [name], MD   8/21/20245:33 PM  CHIEF COMPLAINT:   Diagnosis: CLL   Cancer Staging  No matching staging information was found for the patient.    Prior Therapy: none  Current Therapy:  surveillance    HISTORY OF PRESENT ILLNESS:   Oncology History   No history exists.     INTERVAL HISTORY:   Keith Rivera is a 72 y.o. male presenting to clinic today for follow up of CLL. He was last seen by me on 12/12/22.  Today, he states that he is doing well overall. His appetite level is at 100%. His energy level is at 50%.  PAST MEDICAL HISTORY:   Past Medical History: Past Medical History:  Diagnosis Date   Anemia    BPH (benign prostatic hyperplasia)    Chronic kidney disease     History of hydronephrosis    Hyperlipidemia    MVP (mitral valve prolapse)    Mild mitral regurgitation 2014   Prostate cancer [facility])     Surgical History: Past Surgical History:  Procedure Laterality Date   CATARACT EXTRACTION W/PHACO Right 11/28/2015   Procedure: CATARACT EXTRACTION PHACO AND INTRAOCULAR LENS PLACEMENT (IOC);  Surgeon: Susa Simmonds, MD;  Location: [facility];  Service: Ophthalmology;  Laterality: Right;  CDE: 7.14   COLONOSCOPY N/A 02/08/2015   Procedure: COLONOSCOPY;  Surgeon: Corbin Ade, MD;  Location: [facility];  Service: Endoscopy;  Laterality: N/A;  200   COLONOSCOPY N/A 02/26/2018   Procedure: COLONOSCOPY;  Surgeon: Corbin Ade, MD;  Location: [facility];  Service: Endoscopy;  Laterality: N/A;  1:00   COLONOSCOPY N/A 04/19/2021   Procedure: COLONOSCOPY;  Surgeon: Corbin Ade, MD;  Location: [facility];  Service: Endoscopy;  Laterality: N/A;  ASA II / 10:30   CYSTOSCOPY WITH RETROGRADE PYELOGRAM, URETEROSCOPY AND STENT PLACEMENT Bilateral 12/03/2014   Procedure: CYSTOSCOPY WITH BILATERAL RETROGRADE PYELOGRAM, BILATERAL  URETEROSCOPY ;  Surgeon: Garnett Farm, MD;  Location: [facility];  Service: Urology;  Laterality: Bilateral;   INGUINAL HERNIA REPAIR Left    LAPAROSCOPIC NEPHRECTOMY Right 02/18/2015   Procedure: LAPAROSCOPIC RIGHT RADICAL NEPHRECTOMY;  Surgeon: Crist Fat, MD;  Location: [facility];  Service: Urology;  Laterality: Right;   NEPHROSTOMY  2006   DUE TO ENLARGED PROSTATE   POLYPECTOMY  02/26/2018   Procedure: POLYPECTOMY;  Surgeon: Corbin Ade, MD;  Location: [facility];  Service: Endoscopy;;  ascending colon   POLYPECTOMY  04/19/2021   Procedure: POLYPECTOMY;  Surgeon: Corbin Ade, MD;  Location: [facility];  Service: Endoscopy;;   TONSILLECTOMY     TRANSURETHRAL RESECTION OF PROSTATE     VASECTOMY      Social History: Social History   Socioeconomic History   Marital status: Married    Spouse name: Not on file    Number of children: 0   Years of education: Not on file   Highest education level: Not on file  Occupational History   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+  Tobacco Use   Smoking status: Every Day    Current packs/day: 1.00    Average packs/day: 1 pack/day for 52.0 years (52.0 ttl pk-yrs)    Types: Cigarettes    Start date: 06/29/1971   Smokeless tobacco: Never  Vaping Use   Vaping status: Never Used  Substance and Sexual  Activity   Alcohol use: Yes    Alcohol/week: 0.0 standard drinks of alcohol    Comment: Rare   Drug use: No   Sexual activity: Never    Birth control/protection: None  Other Topics Concern   Not on file  Social History Narrative   Not on file   Social Determinants of Health   Financial Resource Strain: Low Risk  (09/27/2017)   Overall Financial Resource Strain (CARDIA)    Difficulty of Paying Living Expenses: Not hard at all  Food Insecurity: No Food Insecurity (09/27/2017)   Hunger Vital Sign    Worried About Running Out of Food in the Last Year: Never true    Ran Out of Food in the Last Year: Never true  Transportation Needs: No Transportation Needs (09/27/2017)   PRAPARE - Administrator, Civil Service (Medical): No    Lack of Transportation (Non-Medical): No  Physical Activity: Sufficiently Active (09/27/2017)   Exercise Vital Sign    Days of Exercise per Week: 7 days    Minutes of Exercise per Session: 30 min  Stress: No Stress Concern Present (09/27/2017)   [employer] of Occupational Health - Occupational Stress Questionnaire    Feeling of Stress : Not at all  Social Connections: Somewhat Isolated (09/27/2017)   Social Connection and Isolation Panel [NHANES]    Frequency of Communication with Friends and Family: Never    Frequency of Social Gatherings with Friends and Family: Never    Attends Religious Services: Never    Database administrator or Organizations: Yes    Attends Museum/gallery exhibitions officer: More than 4 times per year    Marital Status: Married  Catering manager Violence: Not At Risk (09/27/2017)   Humiliation, Afraid, Rape, and Kick questionnaire    Fear of Current or Ex-Partner: No    Emotionally Abused: No    Physically Abused: No    Sexually Abused: No    Family History: Family History  Problem Relation Age of Onset   Heart attack Father        Died age 50   Colon cancer Maternal Grandfather        In his 42s    Current Medications:  Current Outpatient Medications:    atorvastatin (LIPITOR) 20 MG tablet, Take 20 mg by mouth at bedtime., Disp: , Rfl:    Coenzyme Q10 (CO Q10) 200 MG CAPS, Take 200 mg by mouth at bedtime., Disp: , Rfl:    Cyanocobalamin (VITAMIN B-12 PO), Take 1,000 mcg by mouth daily., Disp: , Rfl:    Multiple Vitamin (MULTIVITAMIN WITH MINERALS) TABS tablet, Take 1 tablet by mouth every morning. , Disp: , Rfl:    sodium bicarbonate 650 MG tablet, Take 1,300 mg by mouth 3 (three) times daily., Disp: , Rfl:    valsartan (DIOVAN) 40 MG tablet, Take 40 mg by mouth daily., Disp: , Rfl:    zinc gluconate 50 MG tablet, Take 50 mg by mouth daily., Disp: , Rfl:    Allergies: No Known Allergies  REVIEW OF SYSTEMS:   Review of Systems  Constitutional:  Negative for chills, fatigue and fever.  HENT:   Negative for lump/mass, mouth sores, nosebleeds, sore throat and trouble swallowing.   Eyes:  Negative for eye problems.  Respiratory:  Negative for cough and shortness of breath.   Cardiovascular:  Negative for chest pain, leg swelling and palpitations.  Gastrointestinal:  Negative for abdominal pain, constipation, diarrhea, nausea and vomiting.  Genitourinary:  Negative for bladder incontinence, difficulty urinating, dysuria, frequency, hematuria and nocturia.   Musculoskeletal:  Negative for arthralgias, back pain, flank pain, myalgias and neck pain.  Skin:  Negative for itching and rash.  Neurological:  Negative for dizziness,  headaches and numbness.  Hematological:  Does not bruise/bleed easily.  Psychiatric/Behavioral:  Negative for depression, sleep disturbance and suicidal ideas. The patient is not nervous/anxious.   All other systems reviewed and are negative.    VITALS:   Blood pressure 110/77, pulse 77, temperature 97.6 F (36.4 C), temperature source Oral, resp. rate 18, weight 153 lb 4.8 oz (69.5 kg), SpO2 98%.  Wt Readings from Last 3 Encounters:  06/19/23 153 lb 4.8 oz (69.5 kg)  12/12/22 154 lb 11.2 oz (70.2 kg)  05/24/22 155 lb 1.6 oz (70.4 kg)    Body mass index is 22.45 kg/m.  Performance status (ECOG): 1 - Symptomatic but completely ambulatory  PHYSICAL EXAM:   Physical Exam Vitals and nursing note reviewed. Exam conducted with a chaperone present.  Constitutional:      Appearance: Normal appearance.  Cardiovascular:     Rate and Rhythm: Normal rate and regular rhythm.     Pulses: Normal pulses.     Heart sounds: Normal heart sounds.  Pulmonary:     Effort: Pulmonary effort is normal.     Breath sounds: Normal breath sounds.  Abdominal:     Palpations: Abdomen is soft. There is no hepatomegaly, splenomegaly or mass.     Tenderness: There is no abdominal tenderness.  Musculoskeletal:     Right lower leg: No edema.     Left lower leg: No edema.  Lymphadenopathy:     Cervical: No cervical adenopathy.     Right cervical: No superficial, deep or posterior cervical adenopathy.    Left cervical: No superficial, deep or posterior cervical adenopathy.     Upper Body:     Right upper body: No supraclavicular or axillary adenopathy.     Left upper body: No supraclavicular or axillary adenopathy.  Neurological:     General: No focal deficit present.     Mental Status: He is alert and oriented to person, place, and time.  Psychiatric:        Mood and Affect: Mood normal.        Behavior: Behavior normal.     LABS:      Latest Ref Rng & Units 06/12/2023    2:44 PM 12/05/2022     1:15 PM 05/17/2022    1:24 PM  CBC  WBC 4.0 - 10.5 K/uL 50.0  42.7  34.1   Hemoglobin 13.0 - 17.0 g/dL 62.1  30.8  65.7   Hematocrit 39.0 - 52.0 % 41.7  46.8  43.4   Platelets 150 - 400 K/uL 129  127  130       Latest Ref Rng & Units 06/12/2023    2:44 PM 12/05/2022    1:15 PM 05/17/2022    1:24 PM  CMP  Glucose 70 - 99 mg/dL 846  86  962   BUN 8 - 23 mg/dL 33  27  32   Creatinine 0.61 - 1.24 mg/dL 9.52  8.41  3.24   Sodium 135 - 145 mmol/L 138  138  138   Potassium 3.5 - 5.1 mmol/L 4.1  4.5  4.4   Chloride 98 - 111 mmol/L 107  108  108   CO2 22 - 32 mmol/L 22  21  22    Calcium 8.9 -  10.3 mg/dL 9.2  9.5  9.4   Total Protein 6.5 - 8.1 g/dL 6.5  7.2  6.8   Total Bilirubin 0.3 - 1.2 mg/dL 0.5  0.9  0.7   Alkaline Phos 38 - 126 U/L 78  85  67   AST 15 - 41 U/L 20  27  18    ALT 0 - 44 U/L 20  19  18       No results found for: "CEA1", "CEA" / No results found for: "CEA1", "CEA" No results found for: "PSA1" No results found for: "WUJ811" No results found for: "CAN125"  No results found for: "TOTALPROTELP", "ALBUMINELP", "A1GS", "A2GS", "BETS", "BETA2SER", "GAMS", "MSPIKE", "SPEI" No results found for: "TIBC", "FERRITIN", "IRONPCTSAT" Lab Results  Component Value Date   LDH 112 06/12/2023   LDH 139 12/05/2022   LDH 117 05/17/2022     STUDIES:   No results found.

## 2023-06-19 ENCOUNTER — Encounter: Payer: Self-pay | Admitting: Hematology

## 2023-06-19 ENCOUNTER — Inpatient Hospital Stay: Payer: Medicare Other | Admitting: Hematology

## 2023-06-19 VITALS — BP 110/77 | HR 77 | Temp 97.6°F | Resp 18 | Wt 153.3 lb

## 2023-06-19 DIAGNOSIS — Z800 Family history of malignant neoplasm of digestive organs: Secondary | ICD-10-CM | POA: Diagnosis not present

## 2023-06-19 DIAGNOSIS — F17210 Nicotine dependence, cigarettes, uncomplicated: Secondary | ICD-10-CM | POA: Diagnosis not present

## 2023-06-19 DIAGNOSIS — Z905 Acquired absence of kidney: Secondary | ICD-10-CM | POA: Diagnosis not present

## 2023-06-19 DIAGNOSIS — Z85528 Personal history of other malignant neoplasm of kidney: Secondary | ICD-10-CM | POA: Diagnosis not present

## 2023-06-19 DIAGNOSIS — C649 Malignant neoplasm of unspecified kidney, except renal pelvis: Secondary | ICD-10-CM | POA: Diagnosis not present

## 2023-06-19 DIAGNOSIS — C9110 Chronic lymphocytic leukemia of B-cell type not having achieved remission: Secondary | ICD-10-CM

## 2023-06-19 DIAGNOSIS — C61 Malignant neoplasm of prostate: Secondary | ICD-10-CM | POA: Diagnosis not present

## 2023-06-19 NOTE — Patient Instructions (Signed)
[facility]  Discharge Instructions  You were seen and examined today by Dr. Ellin Saba.  Dr. Ellin Saba discussed your most recent lab work which revealed that everything looks good and stable.  Follow-up as scheduled in 6 months.    Thank you for choosing [facility] to provide your oncology and hematology care.   To afford each patient quality time with our provider, please arrive at least 15 minutes before your scheduled appointment time. You may need to reschedule your appointment if you arrive late (10 or more minutes). Arriving late affects you and other patients whose appointments are after yours.  Also, if you miss three or more appointments without notifying the office, you may be dismissed from the clinic at the provider's discretion.    Again, thank you for choosing [facility].  Our hope is that these requests will decrease the amount of time that you wait before being seen by our physicians.   If you have a lab appointment with the Cancer Center - please note that after April 8th, all labs will be drawn in the cancer center.  You do not have to check in or register with the main entrance as you have in the past but will complete your check-in at the cancer center.            _____________________________________________________________  Should you have questions after your visit to [facility], please contact our office at [phone] and follow the prompts.  Our office hours are 8:00 a.m. to 4:30 p.m. Monday - Thursday and 8:00 a.m. to 2:30 p.m. Friday.  Please note that voicemails left after 4:00 p.m. may not be returned until the following business day.  We are closed weekends and all major holidays.  You do have access to a nurse 24-7, just call the main number to the clinic [phone] and do not press any options, hold on the line and a nurse will answer the phone.    For prescription  refill requests, have your pharmacy contact our office and allow 72 hours.    Masks are no longer required in the cancer centers. If you would like for your care team to wear a mask while they are taking care of you, please let them know. You may have one support person who is at least 73 years old accompany you for your appointments.

## 2023-07-10 DIAGNOSIS — N189 Chronic kidney disease, unspecified: Secondary | ICD-10-CM | POA: Diagnosis not present

## 2023-07-10 DIAGNOSIS — R809 Proteinuria, unspecified: Secondary | ICD-10-CM | POA: Diagnosis not present

## 2023-07-10 DIAGNOSIS — N2581 Secondary hyperparathyroidism of renal origin: Secondary | ICD-10-CM | POA: Diagnosis not present

## 2023-07-10 DIAGNOSIS — I129 Hypertensive chronic kidney disease with stage 1 through stage 4 chronic kidney disease, or unspecified chronic kidney disease: Secondary | ICD-10-CM | POA: Diagnosis not present

## 2023-07-10 DIAGNOSIS — C9110 Chronic lymphocytic leukemia of B-cell type not having achieved remission: Secondary | ICD-10-CM | POA: Diagnosis not present

## 2023-07-17 DIAGNOSIS — Z905 Acquired absence of kidney: Secondary | ICD-10-CM | POA: Diagnosis not present

## 2023-07-17 DIAGNOSIS — R809 Proteinuria, unspecified: Secondary | ICD-10-CM | POA: Diagnosis not present

## 2023-07-17 DIAGNOSIS — D696 Thrombocytopenia, unspecified: Secondary | ICD-10-CM | POA: Diagnosis not present

## 2023-07-17 DIAGNOSIS — I129 Hypertensive chronic kidney disease with stage 1 through stage 4 chronic kidney disease, or unspecified chronic kidney disease: Secondary | ICD-10-CM | POA: Diagnosis not present

## 2023-08-01 DIAGNOSIS — Z20822 Contact with and (suspected) exposure to covid-19: Secondary | ICD-10-CM | POA: Diagnosis not present

## 2023-08-01 DIAGNOSIS — F17210 Nicotine dependence, cigarettes, uncomplicated: Secondary | ICD-10-CM | POA: Diagnosis not present

## 2023-08-01 DIAGNOSIS — R059 Cough, unspecified: Secondary | ICD-10-CM | POA: Diagnosis not present

## 2023-08-01 DIAGNOSIS — J069 Acute upper respiratory infection, unspecified: Secondary | ICD-10-CM | POA: Diagnosis not present

## 2023-09-03 DIAGNOSIS — I781 Nevus, non-neoplastic: Secondary | ICD-10-CM | POA: Diagnosis not present

## 2023-09-03 DIAGNOSIS — L570 Actinic keratosis: Secondary | ICD-10-CM | POA: Diagnosis not present

## 2023-09-17 DIAGNOSIS — N184 Chronic kidney disease, stage 4 (severe): Secondary | ICD-10-CM | POA: Diagnosis not present

## 2023-09-17 DIAGNOSIS — N189 Chronic kidney disease, unspecified: Secondary | ICD-10-CM | POA: Diagnosis not present

## 2023-09-17 DIAGNOSIS — C9110 Chronic lymphocytic leukemia of B-cell type not having achieved remission: Secondary | ICD-10-CM | POA: Diagnosis not present

## 2023-09-17 DIAGNOSIS — N2581 Secondary hyperparathyroidism of renal origin: Secondary | ICD-10-CM | POA: Diagnosis not present

## 2023-09-17 DIAGNOSIS — I129 Hypertensive chronic kidney disease with stage 1 through stage 4 chronic kidney disease, or unspecified chronic kidney disease: Secondary | ICD-10-CM | POA: Diagnosis not present

## 2023-09-25 DIAGNOSIS — C9110 Chronic lymphocytic leukemia of B-cell type not having achieved remission: Secondary | ICD-10-CM | POA: Diagnosis not present

## 2023-09-25 DIAGNOSIS — Z905 Acquired absence of kidney: Secondary | ICD-10-CM | POA: Diagnosis not present

## 2023-09-25 DIAGNOSIS — R809 Proteinuria, unspecified: Secondary | ICD-10-CM | POA: Diagnosis not present

## 2023-09-25 DIAGNOSIS — D696 Thrombocytopenia, unspecified: Secondary | ICD-10-CM | POA: Diagnosis not present

## 2023-11-14 DIAGNOSIS — H9209 Otalgia, unspecified ear: Secondary | ICD-10-CM | POA: Diagnosis not present

## 2023-11-14 DIAGNOSIS — H9202 Otalgia, left ear: Secondary | ICD-10-CM | POA: Diagnosis not present

## 2023-11-14 DIAGNOSIS — R7301 Impaired fasting glucose: Secondary | ICD-10-CM | POA: Diagnosis not present

## 2023-11-14 DIAGNOSIS — J019 Acute sinusitis, unspecified: Secondary | ICD-10-CM | POA: Diagnosis not present

## 2023-11-14 DIAGNOSIS — E782 Mixed hyperlipidemia: Secondary | ICD-10-CM | POA: Diagnosis not present

## 2023-11-14 DIAGNOSIS — N184 Chronic kidney disease, stage 4 (severe): Secondary | ICD-10-CM | POA: Diagnosis not present

## 2023-11-14 DIAGNOSIS — J302 Other seasonal allergic rhinitis: Secondary | ICD-10-CM | POA: Diagnosis not present

## 2023-11-21 DIAGNOSIS — H9193 Unspecified hearing loss, bilateral: Secondary | ICD-10-CM | POA: Diagnosis not present

## 2023-11-21 DIAGNOSIS — Z23 Encounter for immunization: Secondary | ICD-10-CM | POA: Diagnosis not present

## 2023-11-27 DIAGNOSIS — H109 Unspecified conjunctivitis: Secondary | ICD-10-CM | POA: Diagnosis not present

## 2023-11-27 DIAGNOSIS — H10023 Other mucopurulent conjunctivitis, bilateral: Secondary | ICD-10-CM | POA: Diagnosis not present

## 2023-11-28 DIAGNOSIS — N184 Chronic kidney disease, stage 4 (severe): Secondary | ICD-10-CM | POA: Diagnosis not present

## 2023-11-28 DIAGNOSIS — C9190 Lymphoid leukemia, unspecified not having achieved remission: Secondary | ICD-10-CM | POA: Diagnosis not present

## 2023-11-28 DIAGNOSIS — E782 Mixed hyperlipidemia: Secondary | ICD-10-CM | POA: Diagnosis not present

## 2023-11-28 DIAGNOSIS — H9209 Otalgia, unspecified ear: Secondary | ICD-10-CM | POA: Diagnosis not present

## 2023-11-28 DIAGNOSIS — R7301 Impaired fasting glucose: Secondary | ICD-10-CM | POA: Diagnosis not present

## 2023-11-28 DIAGNOSIS — C641 Malignant neoplasm of right kidney, except renal pelvis: Secondary | ICD-10-CM | POA: Diagnosis not present

## 2023-11-28 DIAGNOSIS — M545 Low back pain, unspecified: Secondary | ICD-10-CM | POA: Diagnosis not present

## 2023-11-28 DIAGNOSIS — D696 Thrombocytopenia, unspecified: Secondary | ICD-10-CM | POA: Diagnosis not present

## 2023-11-28 DIAGNOSIS — F17210 Nicotine dependence, cigarettes, uncomplicated: Secondary | ICD-10-CM | POA: Diagnosis not present

## 2023-11-28 DIAGNOSIS — J302 Other seasonal allergic rhinitis: Secondary | ICD-10-CM | POA: Diagnosis not present

## 2023-12-02 DIAGNOSIS — H10023 Other mucopurulent conjunctivitis, bilateral: Secondary | ICD-10-CM | POA: Diagnosis not present

## 2023-12-02 DIAGNOSIS — H1131 Conjunctival hemorrhage, right eye: Secondary | ICD-10-CM | POA: Diagnosis not present

## 2023-12-04 ENCOUNTER — Encounter: Payer: Self-pay | Admitting: Internal Medicine

## 2023-12-12 DIAGNOSIS — R809 Proteinuria, unspecified: Secondary | ICD-10-CM | POA: Diagnosis not present

## 2023-12-12 DIAGNOSIS — N189 Chronic kidney disease, unspecified: Secondary | ICD-10-CM | POA: Diagnosis not present

## 2023-12-12 DIAGNOSIS — D631 Anemia in chronic kidney disease: Secondary | ICD-10-CM | POA: Diagnosis not present

## 2023-12-19 ENCOUNTER — Inpatient Hospital Stay: Payer: Medicare Other

## 2023-12-19 ENCOUNTER — Inpatient Hospital Stay: Payer: Medicare Other | Attending: Hematology | Admitting: Oncology

## 2023-12-19 DIAGNOSIS — F17210 Nicotine dependence, cigarettes, uncomplicated: Secondary | ICD-10-CM | POA: Diagnosis not present

## 2023-12-19 DIAGNOSIS — Z85528 Personal history of other malignant neoplasm of kidney: Secondary | ICD-10-CM | POA: Insufficient documentation

## 2023-12-19 DIAGNOSIS — Z800 Family history of malignant neoplasm of digestive organs: Secondary | ICD-10-CM | POA: Insufficient documentation

## 2023-12-19 DIAGNOSIS — C9110 Chronic lymphocytic leukemia of B-cell type not having achieved remission: Secondary | ICD-10-CM | POA: Diagnosis not present

## 2023-12-19 DIAGNOSIS — C61 Malignant neoplasm of prostate: Secondary | ICD-10-CM

## 2023-12-19 DIAGNOSIS — Z905 Acquired absence of kidney: Secondary | ICD-10-CM | POA: Insufficient documentation

## 2023-12-19 DIAGNOSIS — C649 Malignant neoplasm of unspecified kidney, except renal pelvis: Secondary | ICD-10-CM

## 2023-12-19 LAB — COMPREHENSIVE METABOLIC PANEL
ALT: 15 U/L (ref 0–44)
AST: 21 U/L (ref 15–41)
Albumin: 3.9 g/dL (ref 3.5–5.0)
Alkaline Phosphatase: 85 U/L (ref 38–126)
Anion gap: 9 (ref 5–15)
BUN: 31 mg/dL — ABNORMAL HIGH (ref 8–23)
CO2: 24 mmol/L (ref 22–32)
Calcium: 9.7 mg/dL (ref 8.9–10.3)
Chloride: 108 mmol/L (ref 98–111)
Creatinine, Ser: 3.3 mg/dL — ABNORMAL HIGH (ref 0.61–1.24)
GFR, Estimated: 19 mL/min — ABNORMAL LOW (ref >60)
Glucose, Bld: 95 mg/dL (ref 70–99)
Potassium: 4.9 mmol/L (ref 3.5–5.1)
Sodium: 141 mmol/L (ref 135–145)
Total Bilirubin: 0.5 mg/dL (ref 0.0–1.2)
Total Protein: 7.3 g/dL (ref 6.5–8.1)

## 2023-12-19 LAB — IRON AND TIBC
Iron: 84 ug/dL (ref 45–182)
Saturation Ratios: 26 % (ref 17.9–39.5)
TIBC: 322 ug/dL (ref 250–450)
UIBC: 238 ug/dL

## 2023-12-19 LAB — FERRITIN: Ferritin: 94 ng/mL (ref 24–336)

## 2023-12-19 LAB — VITAMIN B12: Vitamin B-12: 793 pg/mL (ref 180–914)

## 2023-12-19 LAB — LACTATE DEHYDROGENASE: LDH: 113 U/L (ref 98–192)

## 2023-12-19 LAB — FOLATE: Folate: >40 ng/mL (ref >5.9)

## 2023-12-19 NOTE — Progress Notes (Signed)
 CRITICAL VALUE ALERT Critical value received:  WBC 58.2 Date of notification:  12-19-23 Time of notification: 1054 Critical value read back:  Yes.   Nurse who received alert:  C Greyson Peavy RN MD notified time and response:  Dr. Ellin Saba, 1055. No new orders

## 2023-12-20 DIAGNOSIS — N2581 Secondary hyperparathyroidism of renal origin: Secondary | ICD-10-CM | POA: Diagnosis not present

## 2023-12-20 DIAGNOSIS — N184 Chronic kidney disease, stage 4 (severe): Secondary | ICD-10-CM | POA: Diagnosis not present

## 2023-12-20 DIAGNOSIS — I129 Hypertensive chronic kidney disease with stage 1 through stage 4 chronic kidney disease, or unspecified chronic kidney disease: Secondary | ICD-10-CM | POA: Diagnosis not present

## 2023-12-20 DIAGNOSIS — C9110 Chronic lymphocytic leukemia of B-cell type not having achieved remission: Secondary | ICD-10-CM | POA: Diagnosis not present

## 2023-12-20 LAB — CBC WITH DIFFERENTIAL/PLATELET
Band Neutrophils: 0 %
Blasts: 0 10*3/uL (ref 0.00–0.07)
Eosinophils Absolute: 0 10*3/uL (ref 0.0–0.5)
Eosinophils Relative: 0 %
HCT: 43.9 % (ref 39.0–52.0)
Hemoglobin: 14 g/dL (ref 13.0–17.0)
Immature Granulocytes: 0 10*3/uL (ref 0.0–0.1)
Lymphs Abs: 48.3 10*3/uL — ABNORMAL HIGH (ref 0.7–4.0)
MCH: 32.2 pg (ref 26.0–34.0)
MCHC: 31.9 g/dL (ref 30.0–36.0)
MCV: 100.9 fL — ABNORMAL HIGH (ref 80.0–100.0)
Monocytes Absolute: 10.5 10*3/uL — ABNORMAL HIGH (ref 0.7–4.0)
Monocytes Absolute: 4.7 10*3/uL — ABNORMAL HIGH (ref 0.1–1.0)
Monocytes Relative: 18 %
Monocytes Relative: 8 %
Myelocytes: 65 %
Neutro Abs: 5.2 10*3/uL (ref 1.7–7.7)
Neutrophils Relative %: 9 %
Other: 0 %
Platelets: 194 10*3/uL (ref 150–400)
Promyelocytes Relative: 0 %
RBC: 4.35 MIL/uL (ref 4.22–5.81)
RDW: 14.6 % (ref 11.5–15.5)
Smear Review: 83 %
Smear Review: ADEQUATE
WBC Morphology: 0 10*3/uL (ref 0.00–0.07)
WBC: 58.2 10*3/uL (ref 4.0–10.5)
nRBC: 0 % (ref 0.0–0.2)
nRBC: 0 /100{WBCs}
nRBC: 0 /100{WBCs}

## 2023-12-23 ENCOUNTER — Telehealth (INDEPENDENT_AMBULATORY_CARE_PROVIDER_SITE_OTHER): Payer: Self-pay | Admitting: Otolaryngology

## 2023-12-23 NOTE — Telephone Encounter (Signed)
 Reminder Call: Date: 12/24/2023 Status: Sch  Time: 1:20 PM [address] [place], [place] [address]  Confirmed time and location w/patient.

## 2023-12-24 ENCOUNTER — Ambulatory Visit (INDEPENDENT_AMBULATORY_CARE_PROVIDER_SITE_OTHER): Payer: Medicare Other | Admitting: Otolaryngology

## 2023-12-24 ENCOUNTER — Encounter (INDEPENDENT_AMBULATORY_CARE_PROVIDER_SITE_OTHER): Payer: Self-pay

## 2023-12-24 ENCOUNTER — Ambulatory Visit (INDEPENDENT_AMBULATORY_CARE_PROVIDER_SITE_OTHER): Payer: Medicare Other | Admitting: Audiology

## 2023-12-24 VITALS — BP 109/72 | HR 100 | Ht 70.0 in | Wt 157.0 lb

## 2023-12-24 DIAGNOSIS — H9012 Conductive hearing loss, unilateral, left ear, with unrestricted hearing on the contralateral side: Secondary | ICD-10-CM

## 2023-12-24 DIAGNOSIS — H903 Sensorineural hearing loss, bilateral: Secondary | ICD-10-CM

## 2023-12-24 DIAGNOSIS — H6983 Other specified disorders of Eustachian tube, bilateral: Secondary | ICD-10-CM | POA: Diagnosis not present

## 2023-12-24 DIAGNOSIS — R0981 Nasal congestion: Secondary | ICD-10-CM

## 2023-12-24 DIAGNOSIS — H90A32 Mixed conductive and sensorineural hearing loss, unilateral, left ear with restricted hearing on the contralateral side: Secondary | ICD-10-CM | POA: Diagnosis not present

## 2023-12-24 DIAGNOSIS — J342 Deviated nasal septum: Secondary | ICD-10-CM | POA: Diagnosis not present

## 2023-12-24 DIAGNOSIS — J310 Chronic rhinitis: Secondary | ICD-10-CM | POA: Diagnosis not present

## 2023-12-24 DIAGNOSIS — H9041 Sensorineural hearing loss, unilateral, right ear, with unrestricted hearing on the contralateral side: Secondary | ICD-10-CM

## 2023-12-24 LAB — PATHOLOGIST SMEAR REVIEW

## 2023-12-24 MED ORDER — FLUTICASONE PROPIONATE 50 MCG/ACT NA SUSP: 2.0000 | Freq: Every day | NASAL | 6 refills | Status: AC

## 2023-12-24 NOTE — Progress Notes (Signed)
  [address] [place], [place] [address] [phone]  Audiological Evaluation    Name: Keith Rivera     DOB:   1951/01/17      MRN:   [id number]                                                                                     Service Date: 12/24/2023     Accompanied by: unaccompanied   Patient comes today after Dr. Suszanne Conners, ENT sent a referral for a hearing evaluation due to concerns with Eustachian tube dysfunction.   Symptoms Yes Details  Hearing loss  [x]  Perhaps both ears , may get better when ears pop  Tinnitus  [x]  Both ears; reports also hears heartbeat in the right ear  Ear pain/ infections/pressure  []    Balance problems  []    Noise exposure history  [x]  Shop, guns  Previous ear surgeries  []    Family history of hearing loss  [x]  Grandmother with age  Amplification  []    Other  []      Otoscopy: Right ear: Clear external ear canals and notable landmarks visualized on the tympanic membrane. Left ear:  Clear external ear canals and notable landmarks visualized on the tympanic membrane.  Tympanometry: Right ear: Type A- Normal external ear canal volume with normal middle ear pressure and tympanic membrane compliance Left ear: Type B- Normal external ear canal volume with no middle ear pressure peak or tympanic membrane compliance    Pure tone Audiometry: Right ear- Normal to moderate  sensorineural hearing loss from 250 Hz - 8000 Hz. Left ear-  Borderline normal to severe mixed hearing loss from 250 Hz - 8000 Hz.  Speech Audiometry: Right ear- Speech Reception Threshold (SRT) was obtained at 30 dBHL. Left ear-Speech Reception Threshold (SRT) was obtained at 35 dBHL.   Word Recognition Score Tested using NU-6 (MLV) Right ear: 92% was obtained at a presentation level of 80 dBHL with contralateral masking which is deemed as  excellent. Left ear: 96% was obtained at a presentation level of 80 dBHL with contralateral masking which is deemed as   excellent.   The hearing test results were completed under headphones and re-checked with inserts and results are deemed to be of fair reliability. Test technique:  conventional     Recommendations: Follow up with ENT as scheduled for today., Repeat audiogram after medical care.   Jaleen Finch MARIE LEROUX-MARTINEZ, AUD

## 2023-12-25 DIAGNOSIS — J310 Chronic rhinitis: Secondary | ICD-10-CM | POA: Insufficient documentation

## 2023-12-25 DIAGNOSIS — J342 Deviated nasal septum: Secondary | ICD-10-CM | POA: Insufficient documentation

## 2023-12-25 DIAGNOSIS — H6983 Other specified disorders of Eustachian tube, bilateral: Secondary | ICD-10-CM | POA: Insufficient documentation

## 2023-12-25 DIAGNOSIS — H903 Sensorineural hearing loss, bilateral: Secondary | ICD-10-CM | POA: Insufficient documentation

## 2023-12-25 NOTE — Progress Notes (Signed)
 Patient ID: Keith Rivera, male   DOB: 07-14-1951, 73 y.o.   MRN: [id number]  CC: Clogging sensation in ears, bilateral hearing loss, tinnitus  HPI:  Keith Rivera is a 73 y.o. male who presents today complaining of clogging sensation in his ears and hearing loss.  His symptoms are worse on the left side.  According to the patient, he has been symptomatic for 6 weeks.  He was seen by his primary care physician, and was treated with azithromycin.  However, he continues to be symptomatic.  He is able to perform the Valsalva exercise to auto insufflate his middle ear spaces.  He denies any recent known upper respiratory infection or environmental allergies.  He denies any significant otalgia, otorrhea, or vertigo.  He previously underwent tonsillectomy surgery.  He has no other ENT surgery.  Past Medical History:  Diagnosis Date   Anemia    BPH (benign prostatic hyperplasia)    Chronic kidney disease    History of hydronephrosis    Hyperlipidemia    MVP (mitral valve prolapse)    Mild mitral regurgitation 2014   Prostate cancer [facility])     Past Surgical History:  Procedure Laterality Date   CATARACT EXTRACTION W/PHACO Right 11/28/2015   Procedure: CATARACT EXTRACTION PHACO AND INTRAOCULAR LENS PLACEMENT (IOC);  Surgeon: Susa Simmonds, MD;  Location: [facility];  Service: Ophthalmology;  Laterality: Right;  CDE: 7.14   COLONOSCOPY N/A 02/08/2015   Procedure: COLONOSCOPY;  Surgeon: Corbin Ade, MD;  [facility];  Service: Endoscopy;  Laterality: N/A;  200   COLONOSCOPY N/A 02/26/2018   Procedure: COLONOSCOPY;  Surgeon: Corbin Ade, MD;  [facility];  Service: Endoscopy;  Laterality: N/A;  1:00   COLONOSCOPY N/A 04/19/2021   Procedure: COLONOSCOPY;  Surgeon: Corbin Ade, MD;  [facility];  Service: Endoscopy;  Laterality: N/A;  ASA II / 10:30   CYSTOSCOPY WITH RETROGRADE PYELOGRAM, URETEROSCOPY AND STENT PLACEMENT Bilateral 12/03/2014   Procedure:  CYSTOSCOPY WITH BILATERAL RETROGRADE PYELOGRAM, BILATERAL  URETEROSCOPY ;  Surgeon: Garnett Farm, MD;  Location: [facility];  Service: Urology;  Laterality: Bilateral;   INGUINAL HERNIA REPAIR Left    LAPAROSCOPIC NEPHRECTOMY Right 02/18/2015   Procedure: LAPAROSCOPIC RIGHT RADICAL NEPHRECTOMY;  Surgeon: Crist Fat, MD;  Location: [facility];  Service: Urology;  Laterality: Right;   NEPHROSTOMY  2006   DUE TO ENLARGED PROSTATE   POLYPECTOMY  02/26/2018   Procedure: POLYPECTOMY;  Surgeon: Corbin Ade, MD;  [facility];  Service: Endoscopy;;  ascending colon   POLYPECTOMY  04/19/2021   Procedure: POLYPECTOMY;  Surgeon: Corbin Ade, MD;  [facility];  Service: Endoscopy;;   TONSILLECTOMY     TRANSURETHRAL RESECTION OF PROSTATE     VASECTOMY      Family History  Problem Relation Age of Onset   Heart attack Father        Died age 56   Colon cancer Maternal Grandfather        In his 78s    Social History:  reports that he has been smoking cigarettes. He started smoking about 52 years ago. He has a 52.5 pack-year smoking history. He has never used smokeless tobacco. He reports current alcohol use. He reports that he does not use drugs.  Allergies: No Known Allergies  Prior to Admission medications   Medication Sig Start Date End Date Taking? Authorizing Provider  atorvastatin (LIPITOR) 20 MG tablet Take  20 mg by mouth at bedtime. 03/15/16  Yes [provider]  Coenzyme Q10 (CO Q10) 200 MG CAPS Take 200 mg by mouth at bedtime.   Yes [provider]  Cyanocobalamin (VITAMIN B-12 PO) Take 1,000 mcg by mouth daily.   Yes [provider]  fluticasone (FLONASE) 50 MCG/ACT nasal spray Place 2 sprays into both nostrils daily. 12/24/23 01/23/24 Yes Newman Pies, MD  Multiple Vitamin (MULTIVITAMIN WITH MINERALS) TABS tablet Take 1 tablet by mouth every morning.    Yes [provider]  sodium bicarbonate 650 MG tablet Take 1,300 mg by mouth 3  (three) times daily.   Yes [provider]  valsartan (DIOVAN) 40 MG tablet Take 40 mg by mouth daily. 10/06/21  Yes [provider]  zinc gluconate 50 MG tablet Take 50 mg by mouth daily.   Yes [provider]    Blood pressure 109/72, pulse 100, height 5\' 10"  (1.778 m), weight 157 lb (71.2 kg), SpO2 97%. Exam: General: Communicates without difficulty, well nourished, no acute distress. Head: Normocephalic, no evidence injury, no tenderness, facial buttresses intact without stepoff. Face/sinus: No tenderness to palpation and percussion. Facial movement is normal and symmetric. Eyes: PERRL, EOMI. No scleral icterus, conjunctivae clear. Neuro: CN II exam reveals vision grossly intact.  No nystagmus at any point of gaze. Ears: Auricles well formed without lesions.  Ear canals are intact without mass or lesion.  No erythema or edema is appreciated.  The TMs are intact with retraction of the left tympanic membrane.. Nose: External evaluation reveals normal support and skin without lesions.  Dorsum is intact.  Anterior rhinoscopy reveals congested mucosa over anterior aspect of inferior turbinates and deviated septum.  No purulence noted. Oral:  Oral cavity and oropharynx are intact, symmetric, without erythema or edema.  Mucosa is moist without lesions. Neck: Full range of motion without pain.  There is no significant lymphadenopathy.  No masses palpable.  Thyroid bed within normal limits to palpation.  Parotid glands and submandibular glands equal bilaterally without mass.  Trachea is midline. Neuro:  CN 2-12 grossly intact.   His hearing test shows bilateral moderate sensorineural hearing loss, with additional conductive hearing loss noted on the left side.  Assessment: 1.  The patient's history and physical exam findings are consistent with bilateral eustachian tube dysfunction, worse on the left side. 2.  Chronic rhinitis with nasal mucosal congestion and nasal septal  deviation. 3.  Bilateral moderate sensorineural hearing loss, with additional conductive hearing loss noted on the left side.  Plan: 1.  The physical exam findings and the hearing test results are reviewed with the patient. 2.  Flonase nasal spray 2 sprays each nostril daily. 3.  Valsalva exercise multiple times a day. 4.  The patient will return for reevaluation in 6 weeks.  If he continues to be symptomatic, we will perform nasal endoscopy examination at that time.  Siyah Mault W Shardee Dieu 12/25/2023, 9:58 AM

## 2023-12-26 ENCOUNTER — Inpatient Hospital Stay: Payer: Medicare Other | Admitting: Hematology

## 2023-12-26 VITALS — BP 109/70 | HR 78 | Temp 97.7°F | Resp 18 | Wt 153.0 lb

## 2023-12-26 DIAGNOSIS — Z800 Family history of malignant neoplasm of digestive organs: Secondary | ICD-10-CM | POA: Diagnosis not present

## 2023-12-26 DIAGNOSIS — Z905 Acquired absence of kidney: Secondary | ICD-10-CM | POA: Diagnosis not present

## 2023-12-26 DIAGNOSIS — C9110 Chronic lymphocytic leukemia of B-cell type not having achieved remission: Secondary | ICD-10-CM

## 2023-12-26 DIAGNOSIS — F17210 Nicotine dependence, cigarettes, uncomplicated: Secondary | ICD-10-CM | POA: Diagnosis not present

## 2023-12-26 DIAGNOSIS — Z85528 Personal history of other malignant neoplasm of kidney: Secondary | ICD-10-CM | POA: Diagnosis not present

## 2023-12-26 NOTE — Patient Instructions (Signed)

## 2023-12-26 NOTE — Progress Notes (Signed)
 [facility] [address], [place] [address]    Clinic Day:  12/26/2023  Referring physician: Benita Stabile, MD  Patient Care Team: Benita Stabile, MD as PCP - General (Internal Medicine) Ihor Gully, MD (Inactive) as Consulting Physician (Urology) Crist Fat, MD as Attending Physician (Urology) Jena Gauss Gerrit Friends, MD as Consulting Physician (Gastroenterology)   ASSESSMENT & PLAN:   Assessment: 1.  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  Jak 2 and BCR/ABL negative.   2.  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  This was noted on review of chart after patient left.  - He follows up with Dr. Marlou Porch.  3.  Prostate cancer: - T2a adenocarcinoma the prostate, Gleason 3+4, PSA 8.8. - IMRT 70 Gray in 28 fractions from 06/24/2018 - 08/01/2018.    Plan: 1.  Stage 0 CLL by system: - Denies any infections or B symptoms in the past 6 months. - Physical exam: No palpable adenopathy or splenomegaly. - Labs from 12/19/2023: Creatinine 3.3 and stable.  LDH normal.  White count increased to 58K from 50K.  Hemoglobin and platelet count is normal.  Ferritin is 94 and percent saturation 26. - No indications for treatment at this time.  He may take iron tablet once a week if he feels tired.  RTC 6 months for follow-up with repeat labs and exam.    Orders Placed This Encounter  Procedures   CBC with Differential    Standing Status:   Future    Expected Date:   06/29/2024    Expiration Date:   12/25/2024   Comprehensive metabolic panel    Standing Status:   Future    Expected Date:   06/29/2024    Expiration Date:   12/25/2024   Lactate dehydrogenase    Standing Status:   Future     Expected Date:   06/29/2024    Expiration Date:   12/25/2024      Alben Deeds Teague,acting as a scribe for [name], MD.,have documented all relevant documentation on the behalf of [name], MD,as directed by  [name], MD while in the presence of [name], MD.  I, [name] MD, have reviewed the above documentation for accuracy and completeness, and I agree with the above.    [name], MD   2/27/20254:01 PM  CHIEF COMPLAINT:   Diagnosis: CLL   Cancer Staging  No matching staging information was found for the patient.    Prior Therapy: none  Current Therapy:  surveillance    HISTORY OF PRESENT ILLNESS:   Oncology History   No history exists.     INTERVAL HISTORY:   Jontae is a 73 y.o. male presenting to clinic today for follow up of CLL. He was last seen by me on 06/19/23.  Today, he states that he is doing well overall. His appetite level is at 70%. His energy level is at 100%. He denies any infections, fevers, night sweats, unintentional weight loss, or new bumps/lumps.   PAST MEDICAL HISTORY:   Past Medical History: Past Medical History:  Diagnosis Date   Anemia    BPH (benign prostatic hyperplasia)    Chronic kidney disease    History of hydronephrosis  Hyperlipidemia    MVP (mitral valve prolapse)    Mild mitral regurgitation 2014   Prostate cancer [facility])     Surgical History: Past Surgical History:  Procedure Laterality Date   CATARACT EXTRACTION W/PHACO Right 11/28/2015   Procedure: CATARACT EXTRACTION PHACO AND INTRAOCULAR LENS PLACEMENT (IOC);  Surgeon: Susa Simmonds, MD;  Location: [facility];  Service: Ophthalmology;  Laterality: Right;  CDE: 7.14   COLONOSCOPY N/A 02/08/2015   Procedure: COLONOSCOPY;  Surgeon: Corbin Ade, MD;  [facility];  Service: Endoscopy;  Laterality: N/A;  200   COLONOSCOPY N/A 02/26/2018   Procedure: COLONOSCOPY;  Surgeon: Corbin Ade, MD;   [facility];  Service: Endoscopy;  Laterality: N/A;  1:00   COLONOSCOPY N/A 04/19/2021   Procedure: COLONOSCOPY;  Surgeon: Corbin Ade, MD;  [facility];  Service: Endoscopy;  Laterality: N/A;  ASA II / 10:30   CYSTOSCOPY WITH RETROGRADE PYELOGRAM, URETEROSCOPY AND STENT PLACEMENT Bilateral 12/03/2014   Procedure: CYSTOSCOPY WITH BILATERAL RETROGRADE PYELOGRAM, BILATERAL  URETEROSCOPY ;  Surgeon: Garnett Farm, MD;  [facility]: [facility];  Service: Urology;  Laterality: Bilateral;   INGUINAL HERNIA REPAIR Left    LAPAROSCOPIC NEPHRECTOMY Right 02/18/2015   Procedure: LAPAROSCOPIC RIGHT RADICAL NEPHRECTOMY;  Surgeon: Crist Fat, MD;  [facility];  Service: Urology;  Laterality: Right;   NEPHROSTOMY  2006   DUE TO ENLARGED PROSTATE   POLYPECTOMY  02/26/2018   Procedure: POLYPECTOMY;  Surgeon: Corbin Ade, MD;  [facility];  Service: Endoscopy;;  ascending colon   POLYPECTOMY  04/19/2021   Procedure: POLYPECTOMY;  Surgeon: Corbin Ade, MD;  [facility];  Service: Endoscopy;;   TONSILLECTOMY     TRANSURETHRAL RESECTION OF PROSTATE     VASECTOMY      Social History: Social History   Socioeconomic History   Marital status: Married    Spouse name: Not on file   Number of children: 0   Years of education: Not on file   Highest education level: Not on file  Occupational History   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+  Tobacco Use   Smoking status: Every Day    Current packs/day: 1.00    Average packs/day: 1 pack/day for 52.5 years (52.5 ttl pk-yrs)    Types: Cigarettes    Start date: 06/29/1971   Smokeless tobacco: Never  Vaping Use   Vaping status: Never Used  Substance and Sexual Activity   Alcohol use: Yes    Alcohol/week: 0.0 standard drinks of alcohol    Comment: Rare   Drug use: No   Sexual activity: Never    Birth control/protection: None  Other Topics Concern   Not on file  Social  History Narrative   Not on file   Social Drivers of Health   Financial Resource Strain: Low Risk  (09/27/2017)   Overall Financial Resource Strain (CARDIA)    Difficulty of Paying Living Expenses: Not hard at all  Food Insecurity: No Food Insecurity (09/27/2017)   Hunger Vital Sign    Worried About Running Out of Food in the Last Year: Never true    Ran Out of Food in the Last Year: Never true  Transportation Needs: No Transportation Needs (09/27/2017)   PRAPARE - Administrator, Civil Service (Medical): No    Lack of Transportation (Non-Medical): No  Physical Activity: Sufficiently Active (09/27/2017)   Exercise Vital Sign  Days of Exercise per Week: 7 days    Minutes of Exercise per Session: 30 min  Stress: No Stress Concern Present (09/27/2017)   [employer] of Occupational Health - Occupational Stress Questionnaire    Feeling of Stress : Not at all  Social Connections: Somewhat Isolated (09/27/2017)   Social Connection and Isolation Panel [NHANES]    Frequency of Communication with Friends and Family: Never    Frequency of Social Gatherings with Friends and Family: Never    Attends Religious Services: Never    Database administrator or Organizations: Yes    Attends Engineer, structural: More than 4 times per year    Marital Status: Married  Catering manager Violence: Not At Risk (09/27/2017)   Humiliation, Afraid, Rape, and Kick questionnaire    Fear of Current or Ex-Partner: No    Emotionally Abused: No    Physically Abused: No    Sexually Abused: No    Family History: Family History  Problem Relation Age of Onset   Heart attack Father        Died age 53   Colon cancer Maternal Grandfather        In his 58s    Current Medications:  Current Outpatient Medications:    atorvastatin (LIPITOR) 20 MG tablet, Take 20 mg by mouth at bedtime., Disp: , Rfl:    Coenzyme Q10 (CO Q10) 200 MG CAPS, Take 200 mg by mouth at bedtime., Disp: , Rfl:     Cyanocobalamin (VITAMIN B-12 PO), Take 1,000 mcg by mouth daily., Disp: , Rfl:    fluticasone (FLONASE) 50 MCG/ACT nasal spray, Place 2 sprays into both nostrils daily., Disp: 16 g, Rfl: 6   Multiple Vitamin (MULTIVITAMIN WITH MINERALS) TABS tablet, Take 1 tablet by mouth every morning. , Disp: , Rfl:    sodium bicarbonate 650 MG tablet, Take 1,300 mg by mouth 3 (three) times daily., Disp: , Rfl:    valsartan (DIOVAN) 40 MG tablet, Take 40 mg by mouth daily., Disp: , Rfl:    zinc gluconate 50 MG tablet, Take 50 mg by mouth daily., Disp: , Rfl:    levocetirizine (XYZAL) 5 MG tablet, Take 5 mg by mouth daily., Disp: , Rfl:    trimethoprim-polymyxin b (POLYTRIM) ophthalmic solution, , Disp: , Rfl:    Allergies: No Known Allergies  REVIEW OF SYSTEMS:   Review of Systems  Constitutional:  Negative for chills, fatigue and fever.  HENT:   Negative for lump/mass, mouth sores, nosebleeds, sore throat and trouble swallowing.   Eyes:  Negative for eye problems.  Respiratory:  Negative for cough and shortness of breath.   Cardiovascular:  Negative for chest pain, leg swelling and palpitations.  Gastrointestinal:  Negative for abdominal pain, constipation, diarrhea, nausea and vomiting.  Genitourinary:  Negative for bladder incontinence, difficulty urinating, dysuria, frequency, hematuria and nocturia.   Musculoskeletal:  Negative for arthralgias, back pain, flank pain, myalgias and neck pain.  Skin:  Negative for itching and rash.  Neurological:  Negative for dizziness, headaches and numbness.  Hematological:  Does not bruise/bleed easily.  Psychiatric/Behavioral:  Negative for depression, sleep disturbance and suicidal ideas. The patient is not nervous/anxious.   All other systems reviewed and are negative.    VITALS:   Blood pressure 109/70, pulse 78, temperature 97.7 F (36.5 C), temperature source Tympanic, resp. rate 18, weight 153 lb (69.4 kg), SpO2 96%.  Wt Readings from Last 3  Encounters:  12/26/23 153 lb (69.4 kg)  12/24/23 157  lb (71.2 kg)  06/19/23 153 lb 4.8 oz (69.5 kg)    Body mass index is 21.95 kg/m.  Performance status (ECOG): 1 - Symptomatic but completely ambulatory  PHYSICAL EXAM:   Physical Exam Vitals and nursing note reviewed. Exam conducted with a chaperone present.  Constitutional:      Appearance: Normal appearance.  Cardiovascular:     Rate and Rhythm: Normal rate and regular rhythm.     Pulses: Normal pulses.     Heart sounds: Normal heart sounds.  Pulmonary:     Effort: Pulmonary effort is normal.     Breath sounds: Normal breath sounds.  Abdominal:     Palpations: Abdomen is soft. There is no hepatomegaly, splenomegaly or mass.     Tenderness: There is no abdominal tenderness.  Musculoskeletal:     Right lower leg: No edema.     Left lower leg: No edema.  Lymphadenopathy:     Cervical: No cervical adenopathy.     Right cervical: No superficial, deep or posterior cervical adenopathy.    Left cervical: No superficial, deep or posterior cervical adenopathy.     Upper Body:     Right upper body: No supraclavicular or axillary adenopathy.     Left upper body: No supraclavicular or axillary adenopathy.  Neurological:     General: No focal deficit present.     Mental Status: He is alert and oriented to person, place, and time.  Psychiatric:        Mood and Affect: Mood normal.        Behavior: Behavior normal.     LABS:      Latest Ref Rng & Units 12/19/2023    9:39 AM 06/12/2023    2:44 PM 12/05/2022    1:15 PM  CBC  WBC 4.0 - 10.5 K/uL 58.2  50.0  42.7   Hemoglobin 13.0 - 17.0 g/dL 16.1  09.6  04.5   Hematocrit 39.0 - 52.0 % 43.9  41.7  46.8   Platelets 150 - 400 K/uL 194  129  127       Latest Ref Rng & Units 12/19/2023    9:39 AM 06/12/2023    2:44 PM 12/05/2022    1:15 PM  CMP  Glucose 70 - 99 mg/dL 95  409  86   BUN 8 - 23 mg/dL 31  33  27   Creatinine 0.61 - 1.24 mg/dL 8.11  9.14  7.82   Sodium 135 - 145  mmol/L 141  138  138   Potassium 3.5 - 5.1 mmol/L 4.9  4.1  4.5   Chloride 98 - 111 mmol/L 108  107  108   CO2 22 - 32 mmol/L 24  22  21    Calcium 8.9 - 10.3 mg/dL 9.7  9.2  9.5   Total Protein 6.5 - 8.1 g/dL 7.3  6.5  7.2   Total Bilirubin 0.0 - 1.2 mg/dL 0.5  0.5  0.9   Alkaline Phos 38 - 126 U/L 85  78  85   AST 15 - 41 U/L 21  20  27    ALT 0 - 44 U/L 15  20  19       No results found for: "CEA1", "CEA" / No results found for: "CEA1", "CEA" No results found for: "PSA1" No results found for: "NFA213" No results found for: "CAN125"  No results found for: "TOTALPROTELP", "ALBUMINELP", "A1GS", "A2GS", "BETS", "BETA2SER", "GAMS", "MSPIKE", "SPEI" Lab Results  Component Value Date   TIBC 322  12/19/2023   FERRITIN 94 12/19/2023   IRONPCTSAT 26 12/19/2023   Lab Results  Component Value Date   LDH 113 12/19/2023   LDH 112 06/12/2023   LDH 139 12/05/2022     STUDIES:   No results found.

## 2024-02-05 ENCOUNTER — Ambulatory Visit (INDEPENDENT_AMBULATORY_CARE_PROVIDER_SITE_OTHER): Payer: Medicare Other | Admitting: Otolaryngology

## 2024-02-05 ENCOUNTER — Encounter (INDEPENDENT_AMBULATORY_CARE_PROVIDER_SITE_OTHER): Payer: Self-pay

## 2024-02-05 VITALS — HR 98 | Ht 70.0 in | Wt 156.0 lb

## 2024-02-05 DIAGNOSIS — H6983 Other specified disorders of Eustachian tube, bilateral: Secondary | ICD-10-CM | POA: Diagnosis not present

## 2024-02-05 DIAGNOSIS — J310 Chronic rhinitis: Secondary | ICD-10-CM

## 2024-02-05 DIAGNOSIS — R0981 Nasal congestion: Secondary | ICD-10-CM

## 2024-02-05 DIAGNOSIS — J342 Deviated nasal septum: Secondary | ICD-10-CM | POA: Diagnosis not present

## 2024-02-05 DIAGNOSIS — H903 Sensorineural hearing loss, bilateral: Secondary | ICD-10-CM

## 2024-02-05 NOTE — Progress Notes (Unsigned)
 Patient ID: Keith Rivera, male   DOB: 11/24/1950, 73 y.o.   MRN: [id number]  Follow-up: Bilateral eustachian tube dysfunction, chronic nasal congestion, hearing loss  HPI: The patient is a 73 year old male who returns today for his follow-up evaluation.  He was last seen 6 weeks ago.  At that time, he was complaining of clogging sensation in his ear and chronic nasal congestion.  He was noted to have bilateral eustachian tube dysfunction, nasal mucosal congestion, and nasal septal deviation.  He was also noted to have bilateral moderate sensorineural hearing loss, with additional conductive hearing loss on the left side.  The patient was treated with Flonase and Valsalva exercise.  The patient returns today reporting significant improvement in his symptoms.  The clogging sensation in his ears has resolved.  His nasal congestion and hearing have also improved.  Currently he denies any otalgia, otorrhea, facial pain, or fever.  Exam: General: Communicates without difficulty, well nourished, no acute distress. Head: Normocephalic, no evidence injury, no tenderness, facial buttresses intact without stepoff. Face/sinus: No tenderness to palpation and percussion. Facial movement is normal and symmetric. Eyes: PERRL, EOMI. No scleral icterus, conjunctivae clear. Neuro: CN II exam reveals vision grossly intact.  No nystagmus at any point of gaze. Ears: Auricles well formed without lesions.  Ear canals are intact without mass or lesion.  No erythema or edema is appreciated.  The TMs are intact without fluid. Nose: External evaluation reveals normal support and skin without lesions.  Dorsum is intact.  Anterior rhinoscopy reveals congested mucosa over anterior aspect of inferior turbinates and deviated septum.  No purulence noted. Oral:  Oral cavity and oropharynx are intact, symmetric, without erythema or edema.  Mucosa is moist without lesions. Neck: Full range of motion without pain.  There is no significant  lymphadenopathy.  No masses palpable.  Thyroid bed within normal limits to palpation.  Parotid glands and submandibular glands equal bilaterally without mass.  Trachea is midline. Neuro:  CN 2-12 grossly intact.   Assessment: 1.  Clinically improved bilateral eustachian tube dysfunction. 2.  Chronic rhinitis with nasal mucosal congestion and nasal septal deviation.  The severity of his nasal congestion has decreased.  Plan: 1.  The physical exam findings are reviewed with the patient. 2.  Continue with Flonase nasal spray and Valsalva exercise as needed. 3.  The patient is encouraged to call with any questions or concerns.

## 2024-04-14 DIAGNOSIS — N189 Chronic kidney disease, unspecified: Secondary | ICD-10-CM | POA: Diagnosis not present

## 2024-04-14 DIAGNOSIS — D631 Anemia in chronic kidney disease: Secondary | ICD-10-CM | POA: Diagnosis not present

## 2024-04-14 DIAGNOSIS — R809 Proteinuria, unspecified: Secondary | ICD-10-CM | POA: Diagnosis not present

## 2024-04-14 DIAGNOSIS — E211 Secondary hyperparathyroidism, not elsewhere classified: Secondary | ICD-10-CM | POA: Diagnosis not present

## 2024-04-21 DIAGNOSIS — N184 Chronic kidney disease, stage 4 (severe): Secondary | ICD-10-CM | POA: Diagnosis not present

## 2024-04-21 DIAGNOSIS — I129 Hypertensive chronic kidney disease with stage 1 through stage 4 chronic kidney disease, or unspecified chronic kidney disease: Secondary | ICD-10-CM | POA: Diagnosis not present

## 2024-04-21 DIAGNOSIS — E8722 Chronic metabolic acidosis: Secondary | ICD-10-CM | POA: Diagnosis not present

## 2024-04-21 DIAGNOSIS — D638 Anemia in other chronic diseases classified elsewhere: Secondary | ICD-10-CM | POA: Diagnosis not present

## 2024-05-25 DIAGNOSIS — R7301 Impaired fasting glucose: Secondary | ICD-10-CM | POA: Diagnosis not present

## 2024-05-25 DIAGNOSIS — E782 Mixed hyperlipidemia: Secondary | ICD-10-CM | POA: Diagnosis not present

## 2024-05-29 ENCOUNTER — Other Ambulatory Visit (HOSPITAL_COMMUNITY): Payer: Self-pay | Admitting: Internal Medicine

## 2024-05-29 DIAGNOSIS — C9190 Lymphoid leukemia, unspecified not having achieved remission: Secondary | ICD-10-CM | POA: Diagnosis not present

## 2024-05-29 DIAGNOSIS — F17210 Nicotine dependence, cigarettes, uncomplicated: Secondary | ICD-10-CM | POA: Diagnosis not present

## 2024-05-29 DIAGNOSIS — Z23 Encounter for immunization: Secondary | ICD-10-CM | POA: Diagnosis not present

## 2024-05-29 DIAGNOSIS — R7301 Impaired fasting glucose: Secondary | ICD-10-CM | POA: Diagnosis not present

## 2024-05-29 DIAGNOSIS — R809 Proteinuria, unspecified: Secondary | ICD-10-CM | POA: Diagnosis not present

## 2024-05-29 DIAGNOSIS — D696 Thrombocytopenia, unspecified: Secondary | ICD-10-CM | POA: Diagnosis not present

## 2024-05-29 DIAGNOSIS — G8929 Other chronic pain: Secondary | ICD-10-CM | POA: Diagnosis not present

## 2024-05-29 DIAGNOSIS — Z0000 Encounter for general adult medical examination without abnormal findings: Secondary | ICD-10-CM | POA: Diagnosis not present

## 2024-05-29 DIAGNOSIS — C641 Malignant neoplasm of right kidney, except renal pelvis: Secondary | ICD-10-CM | POA: Diagnosis not present

## 2024-05-29 DIAGNOSIS — M545 Low back pain, unspecified: Secondary | ICD-10-CM | POA: Diagnosis not present

## 2024-05-29 DIAGNOSIS — F17200 Nicotine dependence, unspecified, uncomplicated: Secondary | ICD-10-CM

## 2024-05-29 DIAGNOSIS — J309 Allergic rhinitis, unspecified: Secondary | ICD-10-CM | POA: Diagnosis not present

## 2024-06-12 ENCOUNTER — Ambulatory Visit (HOSPITAL_COMMUNITY)
Admission: RE | Admit: 2024-06-12 | Discharge: 2024-06-12 | Disposition: A | Discharge status: Home / Self Care | Source: Ambulatory Visit | Attending: Internal Medicine | Admitting: Internal Medicine

## 2024-06-12 DIAGNOSIS — E041 Nontoxic single thyroid nodule: Secondary | ICD-10-CM | POA: Diagnosis not present

## 2024-06-12 DIAGNOSIS — J439 Emphysema, unspecified: Secondary | ICD-10-CM | POA: Diagnosis not present

## 2024-06-12 DIAGNOSIS — I77810 Thoracic aortic ectasia: Secondary | ICD-10-CM | POA: Diagnosis not present

## 2024-06-12 DIAGNOSIS — Z122 Encounter for screening for malignant neoplasm of respiratory organs: Secondary | ICD-10-CM | POA: Diagnosis not present

## 2024-06-12 DIAGNOSIS — F17210 Nicotine dependence, cigarettes, uncomplicated: Secondary | ICD-10-CM | POA: Diagnosis not present

## 2024-06-12 DIAGNOSIS — I700 Atherosclerosis of aorta: Secondary | ICD-10-CM | POA: Diagnosis not present

## 2024-06-12 DIAGNOSIS — I2510 Atherosclerotic heart disease of native coronary artery without angina pectoris: Secondary | ICD-10-CM | POA: Insufficient documentation

## 2024-06-12 DIAGNOSIS — F17200 Nicotine dependence, unspecified, uncomplicated: Secondary | ICD-10-CM

## 2024-06-17 ENCOUNTER — Inpatient Hospital Stay: Payer: Medicare Other | Attending: Oncology

## 2024-06-17 DIAGNOSIS — C9110 Chronic lymphocytic leukemia of B-cell type not having achieved remission: Secondary | ICD-10-CM | POA: Insufficient documentation

## 2024-06-17 DIAGNOSIS — C641 Malignant neoplasm of right kidney, except renal pelvis: Secondary | ICD-10-CM | POA: Insufficient documentation

## 2024-06-17 DIAGNOSIS — Z905 Acquired absence of kidney: Secondary | ICD-10-CM | POA: Diagnosis not present

## 2024-06-17 LAB — CBC WITH DIFFERENTIAL/PLATELET
Band Neutrophils: 2 %
Basophils Absolute: 0 K/uL (ref 0.0–0.1)
Basophils Relative: 0 %
Eosinophils Absolute: 0 K/uL (ref 0.0–0.5)
Eosinophils Relative: 0 %
HCT: 43.1 % (ref 39.0–52.0)
Hemoglobin: 13.9 g/dL (ref 13.0–17.0)
Lymphocytes Relative: 78 %
Lymphs Abs: 54.1 K/uL — ABNORMAL HIGH (ref 0.7–4.0)
MCH: 33 pg (ref 26.0–34.0)
MCHC: 32.3 g/dL (ref 30.0–36.0)
MCV: 102.4 fL — ABNORMAL HIGH (ref 80.0–100.0)
Monocytes Absolute: 9.7 K/uL — ABNORMAL HIGH (ref 0.1–1.0)
Monocytes Relative: 14 %
Neutro Abs: 5.5 K/uL (ref 1.7–7.7)
Neutrophils Relative %: 6 %
Platelets: 113 K/uL — ABNORMAL LOW (ref 150–400)
RBC: 4.21 MIL/uL — ABNORMAL LOW (ref 4.22–5.81)
RDW: 15.3 % (ref 11.5–15.5)
WBC: 69.3 K/uL (ref 4.0–10.5)
nRBC: 0 % (ref 0.0–0.2)

## 2024-06-17 LAB — LACTATE DEHYDROGENASE: LDH: 154 U/L (ref 98–192)

## 2024-06-17 LAB — COMPREHENSIVE METABOLIC PANEL WITH GFR
ALT: 21 U/L (ref 0–44)
AST: 24 U/L (ref 15–41)
Albumin: 4.2 g/dL (ref 3.5–5.0)
Alkaline Phosphatase: 90 U/L (ref 38–126)
Anion gap: 12 (ref 5–15)
BUN: 37 mg/dL — ABNORMAL HIGH (ref 8–23)
CO2: 21 mmol/L — ABNORMAL LOW (ref 22–32)
Calcium: 9.6 mg/dL (ref 8.9–10.3)
Chloride: 107 mmol/L (ref 98–111)
Creatinine, Ser: 3.66 mg/dL — ABNORMAL HIGH (ref 0.61–1.24)
GFR, Estimated: 17 mL/min — ABNORMAL LOW (ref >60)
Glucose, Bld: 109 mg/dL — ABNORMAL HIGH (ref 70–99)
Potassium: 4.4 mmol/L (ref 3.5–5.1)
Sodium: 140 mmol/L (ref 135–145)
Total Bilirubin: 0.8 mg/dL (ref 0.0–1.2)
Total Protein: 6.7 g/dL (ref 6.5–8.1)

## 2024-06-17 NOTE — Progress Notes (Signed)
 CRITICAL VALUE ALERT Critical value received:  WBC= 69.3 Date of notification:  06/17/2024 Time of notification: 1553 Critical value read back:  Yes.   Nurse who received alert:  Dena Daring, RN MD notified time and response:  Dr. Davonna and Delon Hope, NP made aware. Follow up with provider next week.

## 2024-06-24 ENCOUNTER — Inpatient Hospital Stay (HOSPITAL_BASED_OUTPATIENT_CLINIC_OR_DEPARTMENT_OTHER): Payer: Medicare Other | Admitting: Oncology

## 2024-06-24 VITALS — BP 107/67 | HR 77 | Temp 97.5°F | Resp 18 | Wt 155.0 lb

## 2024-06-24 DIAGNOSIS — C9110 Chronic lymphocytic leukemia of B-cell type not having achieved remission: Secondary | ICD-10-CM | POA: Diagnosis not present

## 2024-06-24 DIAGNOSIS — C641 Malignant neoplasm of right kidney, except renal pelvis: Secondary | ICD-10-CM | POA: Diagnosis not present

## 2024-06-24 DIAGNOSIS — Z905 Acquired absence of kidney: Secondary | ICD-10-CM | POA: Diagnosis not present

## 2024-06-24 DIAGNOSIS — C649 Malignant neoplasm of unspecified kidney, except renal pelvis: Secondary | ICD-10-CM

## 2024-06-24 NOTE — Progress Notes (Signed)
 [facility] OFFICE PROGRESS NOTE  Shona Norleen PEDLAR, MD  ASSESSMENT & PLAN:    Assessment & Plan CLL (chronic lymphocytic leukemia) (HCC) - Denies any infections or B symptoms in the past 6 months. - Physical exam: No palpable adenopathy or splenomegaly. - Labs from 06/17/2024 showed creatinine 3.66 which is more or less stable.  His white count has increased to 69K from 58K.  Hemoglobin is WNL platelet counts are 113.  LDH is 154. - No indications for treatment at this time.  He may take iron tablet once a week if he feels tired.  RTC 6 months for follow-up with repeat labs and exam. 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  This was noted on review of chart after patient left.  - He follows up with Dr. Cam.  Orders Placed This Encounter  Procedures   CBC with Differential    Standing Status:   Future    Expected Date:   10/24/2024    Expiration Date:   01/22/2025   Comprehensive metabolic panel    Standing Status:   Future    Expected Date:   10/24/2024    Expiration Date:   01/22/2025   Lactate dehydrogenase    Standing Status:   Future    Expected Date:   10/24/2024    Expiration Date:   01/22/2025    INTERVAL HISTORY: Patient returns for follow-up for CLL and renal cell carcinoma.  Appetite 100% energy level 60%.  Denies any pain. Denies any B-symptoms. No recent infections or steroid use.  He denies any new lumps or bumps.  Denies any interval hospitalizations, surgeries or changes to his baseline health.  We reviewed CBC, CMP and LDH.  SUMMARY OF HEMATOLOGIC HISTORY: Oncology History   No history exists.    -Diagnosis by peripheral blood flow cytometry on 03/07/2016 showing CD5 and CD23 positive B-cell population - Interestingly positive for CALR mutation, which can be positive in the ET and PMF.  Jak 2 and BCR/ABL negative CBC     Component Value Date/Time   WBC 69.3 (HH) 06/17/2024 1428   RBC 4.21 (L) 06/17/2024 1428   HGB 13.9 06/17/2024 1428   HCT 43.1 06/17/2024 1428   PLT 113 (L) 06/17/2024 1428   MCV 102.4 (H) 06/17/2024 1428   MCH 33.0 06/17/2024 1428   MCHC 32.3 06/17/2024 1428   RDW 15.3 06/17/2024 1428   LYMPHSABS 54.1 (H) 06/17/2024 1428   MONOABS 9.7 (H) 06/17/2024 1428   EOSABS 0.0 06/17/2024 1428   BASOSABS 0.0 06/17/2024 1428       Latest Ref Rng & Units 06/17/2024    2:28 PM 12/19/2023    9:39 AM 06/12/2023    2:44 PM  CMP  Glucose 70 - 99 mg/dL 890  95  893   BUN 8 - 23 mg/dL 37  31  33   Creatinine 0.61 - 1.24 mg/dL 6.33  6.69  6.80   Sodium 135 - 145 mmol/L 140  141  138   Potassium 3.5 - 5.1 mmol/L 4.4  4.9  4.1   Chloride 98 - 111 mmol/L 107  108  107   CO2 22 - 32 mmol/L 21  24  22    Calcium  8.9 - 10.3 mg/dL 9.6  9.7  9.2   Total Protein 6.5 - 8.1 g/dL 6.7  7.3  6.5   Total Bilirubin 0.0 - 1.2 mg/dL 0.8  0.5  0.5   Alkaline Phos 38 - 126 U/L 90  85  78   AST 15 - 41 U/L 24  21  20    ALT 0 - 44 U/L 21  15  20       Lab Results  Component Value Date   FERRITIN 94 12/19/2023   VITAMINB12 793 12/19/2023    Vitals:   06/24/24 1435  BP: 107/67  Pulse: 77  Resp: 18  Temp: (!) 97.5 F (36.4 C)  SpO2: 100%    Review of System:  Review of Systems  Constitutional:  Negative for malaise/fatigue and weight loss.  Gastrointestinal:  Negative for diarrhea.  Neurological:  Negative for dizziness.  Psychiatric/Behavioral:  The patient is not nervous/anxious.     Physical Exam: Physical Exam Constitutional:      Appearance: Normal appearance.  HENT:     Head: Normocephalic and atraumatic.  Eyes:     Pupils: Pupils are equal, round, and reactive to light.  Cardiovascular:     Rate and Rhythm: Normal rate and regular rhythm.     Heart sounds: Normal heart sounds. No murmur heard. Pulmonary:     Effort: Pulmonary effort is normal.     Breath sounds: Normal breath  sounds. No wheezing.  Abdominal:     General: Bowel sounds are normal. There is no distension.     Palpations: Abdomen is soft.     Tenderness: There is no abdominal tenderness.  Musculoskeletal:        General: Normal range of motion.     Cervical back: Normal range of motion.  Skin:    General: Skin is warm and dry.     Findings: No rash.  Neurological:     Mental Status: He is alert and oriented to person, place, and time.     Gait: Gait is intact.  Psychiatric:        Mood and Affect: Mood and affect normal.        Cognition and Memory: Memory normal.        Judgment: Judgment normal.      I spent 15 minutes dedicated to the care of this patient (face-to-face and non-face-to-face) on the date of the encounter to include what is described in the assessment and plan.,  Delon Hope, NP 06/24/2024 3:02 PM

## 2024-06-24 NOTE — Assessment & Plan Note (Addendum)
-   Denies any infections or B symptoms in the past 6 months. - Physical exam: No palpable adenopathy or splenomegaly. - Labs from 06/17/2024 showed creatinine 3.66 which is more or less stable.  His white count has increased to 69K from 58K.  Hemoglobin is WNL platelet counts are 113.  LDH is 154. - No indications for treatment at this time.  He may take iron tablet once a week if he feels tired.  RTC 6 months for follow-up with repeat labs and exam.

## 2024-06-24 NOTE — Assessment & Plan Note (Addendum)
-  Underwent right nephrectomy on 02/18/2015, 2.2 cm papillary RCC, limited to kidney, margins negative.  This was noted on review of chart after patient left.  - He follows up with Dr. Cam.

## 2024-07-06 ENCOUNTER — Other Ambulatory Visit (HOSPITAL_COMMUNITY): Payer: Self-pay | Admitting: Internal Medicine

## 2024-07-06 DIAGNOSIS — E079 Disorder of thyroid, unspecified: Secondary | ICD-10-CM

## 2024-07-10 DIAGNOSIS — R809 Proteinuria, unspecified: Secondary | ICD-10-CM | POA: Diagnosis not present

## 2024-07-10 DIAGNOSIS — D631 Anemia in chronic kidney disease: Secondary | ICD-10-CM | POA: Diagnosis not present

## 2024-07-17 DIAGNOSIS — N2581 Secondary hyperparathyroidism of renal origin: Secondary | ICD-10-CM | POA: Diagnosis not present

## 2024-07-17 DIAGNOSIS — C9110 Chronic lymphocytic leukemia of B-cell type not having achieved remission: Secondary | ICD-10-CM | POA: Diagnosis not present

## 2024-07-17 DIAGNOSIS — N184 Chronic kidney disease, stage 4 (severe): Secondary | ICD-10-CM | POA: Diagnosis not present

## 2024-07-17 DIAGNOSIS — I129 Hypertensive chronic kidney disease with stage 1 through stage 4 chronic kidney disease, or unspecified chronic kidney disease: Secondary | ICD-10-CM | POA: Diagnosis not present

## 2024-07-20 ENCOUNTER — Ambulatory Visit (HOSPITAL_COMMUNITY)
Admission: RE | Admit: 2024-07-20 | Discharge: 2024-07-20 | Disposition: A | Discharge status: Home / Self Care | Source: Ambulatory Visit | Attending: Internal Medicine | Admitting: Internal Medicine

## 2024-07-20 DIAGNOSIS — E079 Disorder of thyroid, unspecified: Secondary | ICD-10-CM | POA: Insufficient documentation

## 2024-07-20 DIAGNOSIS — E041 Nontoxic single thyroid nodule: Secondary | ICD-10-CM | POA: Diagnosis not present

## 2024-07-27 ENCOUNTER — Other Ambulatory Visit: Payer: Self-pay | Admitting: Internal Medicine

## 2024-07-27 DIAGNOSIS — E041 Nontoxic single thyroid nodule: Secondary | ICD-10-CM

## 2024-08-03 DIAGNOSIS — J329 Chronic sinusitis, unspecified: Secondary | ICD-10-CM | POA: Diagnosis not present

## 2024-08-14 ENCOUNTER — Other Ambulatory Visit (HOSPITAL_COMMUNITY)
Admission: RE | Admit: 2024-08-14 | Discharge: 2024-08-14 | Disposition: A | Discharge status: Home / Self Care | Source: Ambulatory Visit | Attending: Student | Admitting: Student

## 2024-08-14 ENCOUNTER — Ambulatory Visit
Admission: RE | Admit: 2024-08-14 | Discharge: 2024-08-14 | Disposition: A | Source: Ambulatory Visit | Attending: Internal Medicine | Admitting: Internal Medicine

## 2024-08-14 DIAGNOSIS — E041 Nontoxic single thyroid nodule: Secondary | ICD-10-CM

## 2024-08-18 LAB — CYTOLOGY - NON PAP

## 2024-08-19 ENCOUNTER — Other Ambulatory Visit

## 2024-11-04 ENCOUNTER — Inpatient Hospital Stay: Attending: Oncology

## 2024-11-04 DIAGNOSIS — C9110 Chronic lymphocytic leukemia of B-cell type not having achieved remission: Secondary | ICD-10-CM | POA: Insufficient documentation

## 2024-11-04 DIAGNOSIS — Z905 Acquired absence of kidney: Secondary | ICD-10-CM | POA: Insufficient documentation

## 2024-11-04 DIAGNOSIS — D696 Thrombocytopenia, unspecified: Secondary | ICD-10-CM | POA: Insufficient documentation

## 2024-11-04 DIAGNOSIS — Z7952 Long term (current) use of systemic steroids: Secondary | ICD-10-CM | POA: Insufficient documentation

## 2024-11-04 DIAGNOSIS — Z85528 Personal history of other malignant neoplasm of kidney: Secondary | ICD-10-CM | POA: Insufficient documentation

## 2024-11-04 LAB — CBC WITH DIFFERENTIAL/PLATELET
Basophils Absolute: 0 K/uL (ref 0.0–0.1)
Basophils Relative: 0 %
Eosinophils Absolute: 0 K/uL (ref 0.0–0.5)
Eosinophils Relative: 0 %
HCT: 41.7 % (ref 39.0–52.0)
Hemoglobin: 13.2 g/dL (ref 13.0–17.0)
Lymphocytes Relative: 74 %
Lymphs Abs: 56 K/uL — ABNORMAL HIGH (ref 0.7–4.0)
MCH: 32.4 pg (ref 26.0–34.0)
MCHC: 31.7 g/dL (ref 30.0–36.0)
MCV: 102.2 fL — ABNORMAL HIGH (ref 80.0–100.0)
Monocytes Absolute: 9.1 K/uL — ABNORMAL HIGH (ref 0.1–1.0)
Monocytes Relative: 12 %
Neutro Abs: 10.6 K/uL — ABNORMAL HIGH (ref 1.7–7.7)
Neutrophils Relative %: 14 %
Platelets: <5 K/uL — CL (ref 150–400)
RBC: 4.08 MIL/uL — ABNORMAL LOW (ref 4.22–5.81)
RDW: 15.2 % (ref 11.5–15.5)
WBC: 75.7 K/uL (ref 4.0–10.5)
nRBC: 0 % (ref 0.0–0.2)

## 2024-11-04 LAB — COMPREHENSIVE METABOLIC PANEL WITH GFR
ALT: 19 U/L (ref 0–44)
AST: 23 U/L (ref 15–41)
Albumin: 4.5 g/dL (ref 3.5–5.0)
Alkaline Phosphatase: 99 U/L (ref 38–126)
Anion gap: 16 — ABNORMAL HIGH (ref 5–15)
BUN: 30 mg/dL — ABNORMAL HIGH (ref 8–23)
CO2: 20 mmol/L — ABNORMAL LOW (ref 22–32)
Calcium: 9.9 mg/dL (ref 8.9–10.3)
Chloride: 108 mmol/L (ref 98–111)
Creatinine, Ser: 3.33 mg/dL — ABNORMAL HIGH (ref 0.61–1.24)
GFR, Estimated: 19 mL/min — ABNORMAL LOW
Glucose, Bld: 86 mg/dL (ref 70–99)
Potassium: 4.9 mmol/L (ref 3.5–5.1)
Sodium: 143 mmol/L (ref 135–145)
Total Bilirubin: 0.6 mg/dL (ref 0.0–1.2)
Total Protein: 6.7 g/dL (ref 6.5–8.1)

## 2024-11-04 LAB — LACTATE DEHYDROGENASE: LDH: 170 U/L (ref 105–235)

## 2024-11-04 NOTE — Progress Notes (Signed)
 CRITICAL VALUE ALERT Critical value received:  WBC 75.7 K  Date of notification:  11-04-2024 Time of notification: 11:45 am.  Critical value read back:  Yes.   Nurse who received alert:  B.Shariyah Eland RN.  MD notified time and response:  J.Burns NP @ 11:54 am. Patient has appointment with J.Burns NP today per telephone visit.

## 2024-11-09 ENCOUNTER — Inpatient Hospital Stay

## 2024-11-09 ENCOUNTER — Ambulatory Visit: Payer: Self-pay | Admitting: Oncology

## 2024-11-09 ENCOUNTER — Telehealth: Payer: Self-pay | Admitting: *Deleted

## 2024-11-09 ENCOUNTER — Other Ambulatory Visit: Payer: Self-pay | Admitting: Oncology

## 2024-11-09 VITALS — BP 106/65 | HR 72 | Temp 97.8°F | Resp 17

## 2024-11-09 DIAGNOSIS — C9110 Chronic lymphocytic leukemia of B-cell type not having achieved remission: Secondary | ICD-10-CM

## 2024-11-09 MED ORDER — SODIUM CHLORIDE 0.9% IV SOLUTION
250.0000 mL | INTRAVENOUS | Status: DC
  Administered 2024-11-09: 100 mL via INTRAVENOUS

## 2024-11-09 MED ORDER — DIPHENHYDRAMINE HCL 25 MG PO CAPS: 25.0000 mg | ORAL_CAPSULE | Freq: Once | ORAL | Status: DC

## 2024-11-09 MED ORDER — ACETAMINOPHEN 325 MG PO TABS
650.0000 mg | ORAL_TABLET | Freq: Once | ORAL | Status: AC
  Administered 2024-11-09: 650 mg via ORAL
  Filled 2024-11-09: qty 2

## 2024-11-09 MED ORDER — DIPHENHYDRAMINE HCL 25 MG PO TABS
25.0000 mg | ORAL_TABLET | Freq: Once | ORAL | Status: AC
  Administered 2024-11-09: 25 mg via ORAL
  Filled 2024-11-09: qty 1

## 2024-11-09 NOTE — Progress Notes (Signed)
 Patient presents today for 1 unit of platelets. Vital signs stable. Platelet ,5,000. Message received from J.Burns NP to give 1 unit of platelets.   1 unit of platelets given today per MD orders. Tolerated infusion without adverse affects. Vital signs stable. No complaints at this time. Discharged from clinic ambulatory in stable condition. Alert and oriented x 3. F/U with [facility] as scheduled.

## 2024-11-09 NOTE — Telephone Encounter (Signed)
 Per Delon Hope, NP, patient's platelets < 5.  She has requested that he come in today for repeat labs and 1 unit of platelets.  Spoke with patient's wife and made her aware of recommendations.  States patient is at work and will call him to make him aware to come here to clinic ASAP for platelets and verbalized understanding.

## 2024-11-09 NOTE — Patient Instructions (Signed)
 [facility] - A DEPT OF [facility]  Discharge Instructions: Thank you for choosing [facility] to provide your oncology and hematology care.  If you have a lab appointment with the Cancer Center - please note that after April 8th, 2024, all labs will be drawn in the cancer center.  You do not have to check in or register with the main entrance as you have in the past but will complete your check-in in the cancer center.  Wear comfortable clothing and clothing appropriate for easy access to any Portacath or PICC line.   We strive to give you quality time with your provider. You may need to reschedule your appointment if you arrive late (15 or more minutes).  Arriving late affects you and other patients whose appointments are after yours.  Also, if you miss three or more appointments without notifying the office, you may be dismissed from the clinic at the providers discretion.      For prescription refill requests, have your pharmacy contact our office and allow 72 hours for refills to be completed.    Today you received the following chemotherapy and/or immunotherapy agents platelets. Getting Platelets Through an IV (Platelet Transfusion) in Adults: What to Expect A platelet transfusion is when a person gets platelets from another person (donor) through an IV. Platelets are parts of blood that stick together and form a clot to help stop bleeding after an injury. If you don't have enough platelets, you might bleed or bruise easily. You may need a platelet transfusion if you have a health problem that causes a low number of platelets, such as thrombocytopenia. A platelet transfusion may be used to stop or prevent too much bleeding. Tell a health care provider about: Any reactions you've had during past transfusions. Any allergies you have. All medicines you take. These include vitamins, herbs, eye drops, and creams. Any bleeding problems you have. Any  surgeries you've had. Any health problems you have. Whether you're pregnant or may be pregnant. What are the risks? Your health care provider will talk with you about risks. These may include: Fever or chills. This might happen if your body reacts to the new cells. It can happen during or up to 4 hours after the transfusion. A mild allergy. You might get red, swollen areas on your skin (hives). You might feel itchy. A bad allergy. You might have trouble breathing or swelling around your face and lips. Very bad reactions are rare but can happen. These may be: Infection. This is rare because donated blood is carefully tested. Hemolytic reaction. This is when the defense system (immune system) of your body attacks the new cells. Symptoms include fever, chills, and a feeling that you may throw up. You may also have low blood pressure and pain in your chest or lower back. Transfusion-associated circulatory overload (TACO). This happens if fluids move to your lungs. This may cause breathing problems. Transfusion-related acute lung injury (TRALI). TRALI can cause breathing problems and low oxygen in your blood. This can happen within hours or days after the transfusion. Transfusion-associated graft-versus-host disease (TAGVHD). This happens when donated cells attack the body's healthy tissues. What happens before? Take your medicines only as told. You will have a blood test to find out your blood type. This helps match the donor blood with your blood. If you've had an allergy to a transfusion in the past, you may be given medicine to help prevent another allergy. Your  temperature, blood pressure, pulse, and breathing will be checked. What happens during platelet transfusion?  An IV will be put into a vein in your hand or arm. For your safety, two health care team members will check your identity and the donor platelets. The bag of donor platelets will be connected to your IV. The platelets will  flow into your blood. This usually takes 30-60 minutes. Your temperature, blood pressure, pulse, and breathing will be checked. This helps catch signs of a reaction early. You will be watched for symptoms of all types of reactions, like chills, hives, or itching. If you show signs of a reaction, the transfusion will be stopped. You may be given medicine to help treat the reaction. When your transfusion is done, your IV will be removed. Pressure may be put on the IV site for a few minutes to stop any bleeding. The IV site will be covered with a bandage. These steps may vary. Ask what you can expect. What happens after? You will be watched closely until you leave. This includes checking your blood pressure, temperature, pulse rate, and breathing rate again. This information is not intended to replace advice given to you by your health care provider. Make sure you discuss any questions you have with your health care provider. Document Revised: 01/31/2024 Document Reviewed: 01/31/2024 Elsevier Patient Education  2025 [employer].      To help prevent nausea and vomiting after your treatment, we encourage you to take your nausea medication as directed.  BELOW ARE SYMPTOMS THAT SHOULD BE REPORTED IMMEDIATELY: *FEVER GREATER THAN 100.4 F (38 C) OR HIGHER *CHILLS OR SWEATING *NAUSEA AND VOMITING THAT IS NOT CONTROLLED WITH YOUR NAUSEA MEDICATION *UNUSUAL SHORTNESS OF BREATH *UNUSUAL BRUISING OR BLEEDING *URINARY PROBLEMS (pain or burning when urinating, or frequent urination) *BOWEL PROBLEMS (unusual diarrhea, constipation, pain near the anus) TENDERNESS IN MOUTH AND THROAT WITH OR WITHOUT PRESENCE OF ULCERS (sore throat, sores in mouth, or a toothache) UNUSUAL RASH, SWELLING OR PAIN  UNUSUAL VAGINAL DISCHARGE OR ITCHING   Items with * indicate a potential emergency and should be followed up as soon as possible or go to the Emergency Department if any problems should occur.  Please show the  CHEMOTHERAPY ALERT CARD or IMMUNOTHERAPY ALERT CARD at check-in to the Emergency Department and triage nurse.  Should you have questions after your visit or need to cancel or reschedule your appointment, please contact [facility]  - A DEPT OF [facility] [phone]  and follow the prompts.  Office hours are 8:00 a.m. to 4:30 p.m. Monday - Friday. Please note that voicemails left after 4:00 p.m. may not be returned until the following business day.  We are closed weekends and major holidays. You have access to a nurse at all times for urgent questions. Please call the main number to the clinic [phone] and follow the prompts.  For any non-urgent questions, you may also contact your provider using MyChart. We now offer e-Visits for anyone 44 and older to request care online for non-urgent symptoms. For details visit [email or web address].   Also download the MyChart app! Go to the app store, search MyChart, open the app, select [place], and log in with your MyChart username and password.

## 2024-11-10 ENCOUNTER — Other Ambulatory Visit: Payer: Self-pay | Admitting: Oncology

## 2024-11-10 ENCOUNTER — Other Ambulatory Visit: Payer: Self-pay

## 2024-11-10 ENCOUNTER — Ambulatory Visit: Payer: Self-pay | Admitting: Oncology

## 2024-11-10 DIAGNOSIS — C9110 Chronic lymphocytic leukemia of B-cell type not having achieved remission: Secondary | ICD-10-CM

## 2024-11-10 DIAGNOSIS — C649 Malignant neoplasm of unspecified kidney, except renal pelvis: Secondary | ICD-10-CM

## 2024-11-10 DIAGNOSIS — C61 Malignant neoplasm of prostate: Secondary | ICD-10-CM

## 2024-11-10 LAB — CBC WITH DIFFERENTIAL/PLATELET
Abs Immature Granulocytes: 0.08 K/uL — ABNORMAL HIGH (ref 0.00–0.07)
Basophils Absolute: 0.1 K/uL (ref 0.0–0.1)
Basophils Relative: 0 %
Eosinophils Absolute: 0.1 K/uL (ref 0.0–0.5)
Eosinophils Relative: 0 %
HCT: 42.4 % (ref 39.0–52.0)
Hemoglobin: 13.5 g/dL (ref 13.0–17.0)
Immature Granulocytes: 0 %
Lymphocytes Relative: 80 %
Lymphs Abs: 57.5 K/uL — ABNORMAL HIGH (ref 0.7–4.0)
MCH: 32.2 pg (ref 26.0–34.0)
MCHC: 31.8 g/dL (ref 30.0–36.0)
MCV: 101.2 fL — ABNORMAL HIGH (ref 80.0–100.0)
Monocytes Absolute: 11.7 K/uL — ABNORMAL HIGH (ref 0.1–1.0)
Monocytes Relative: 16 %
Neutro Abs: 3.2 K/uL (ref 1.7–7.7)
Neutrophils Relative %: 4 %
Platelets: 5 K/uL — CL (ref 150–400)
RBC: 4.19 MIL/uL — ABNORMAL LOW (ref 4.22–5.81)
RDW: 15.5 % (ref 11.5–15.5)
WBC: 72.6 K/uL (ref 4.0–10.5)
nRBC: 0 % (ref 0.0–0.2)

## 2024-11-10 LAB — BPAM PLATELET PHERESIS
Blood Product Expiration Date: 202601152359
ISSUE DATE / TIME: 202601121514
Unit Type and Rh: 5100

## 2024-11-10 LAB — PREPARE PLATELET PHERESIS: Unit division: 0

## 2024-11-10 MED ORDER — DEXAMETHASONE 4 MG PO TABS: 40.0000 mg | ORAL_TABLET | Freq: Every day | ORAL | 0 refills | Status: AC

## 2024-11-10 NOTE — Progress Notes (Signed)
 Re: low platelets with worsening lymphocytosis in the setting of CLL.  Patient received 1 unit of platelets yesterday.  He will receive a second unit of platelets tomorrow.  Start 40 mg dexamethasone  x 4 days per Dr. Davonna.   Patient will be seen as a mutual visit tomorrow.   Delon Hope, AGNP-C Department of Hematology/Oncology [facility] at [facility] [place]  Phone: [phone]  11/10/2024 11:47 AM

## 2024-11-10 NOTE — Progress Notes (Signed)
 Message left for a return call.  Appointments made.

## 2024-11-10 NOTE — Progress Notes (Signed)
 Yes tomorrow. Okay I will start him on Dexamethosone 40 mg daily X 4.   Tomi can you call him and let them know I am sending in a prescription for the dexamethasone .  He will need to pick that up and start today.  Amy can you get him scheduled for platelet transfusion tomorrow after our visit?  Delon Hope, AGNP-C Department of Hematology/Oncology [facility] at [facility]  Phone: [phone]  11/10/2024 11:40 AM

## 2024-11-10 NOTE — Progress Notes (Signed)
 Patient's wife made aware of appointments and new medication, as patient is currently at work.  She verbalized understanding and patient will be here as scheduled.

## 2024-11-10 NOTE — Progress Notes (Unsigned)
 One unit of platelets prepared for transfusion tomorrow per Dr. Davonna

## 2024-11-11 ENCOUNTER — Inpatient Hospital Stay

## 2024-11-11 ENCOUNTER — Inpatient Hospital Stay: Admitting: Oncology

## 2024-11-11 VITALS — BP 111/74 | HR 74 | Temp 97.6°F | Resp 16 | Wt 151.0 lb

## 2024-11-11 DIAGNOSIS — C9110 Chronic lymphocytic leukemia of B-cell type not having achieved remission: Secondary | ICD-10-CM

## 2024-11-11 DIAGNOSIS — C649 Malignant neoplasm of unspecified kidney, except renal pelvis: Secondary | ICD-10-CM

## 2024-11-11 DIAGNOSIS — C61 Malignant neoplasm of prostate: Secondary | ICD-10-CM

## 2024-11-11 LAB — CBC
HCT: 41.3 % (ref 39.0–52.0)
Hemoglobin: 12.9 g/dL — ABNORMAL LOW (ref 13.0–17.0)
MCH: 32.2 pg (ref 26.0–34.0)
MCHC: 31.2 g/dL (ref 30.0–36.0)
MCV: 103 fL — ABNORMAL HIGH (ref 80.0–100.0)
Platelets: 15 K/uL — CL (ref 150–400)
RBC: 4.01 MIL/uL — ABNORMAL LOW (ref 4.22–5.81)
RDW: 15.7 % — ABNORMAL HIGH (ref 11.5–15.5)
WBC: 70.8 K/uL (ref 4.0–10.5)
nRBC: 0 % (ref 0.0–0.2)

## 2024-11-11 LAB — SAMPLE TO BLOOD BANK

## 2024-11-11 NOTE — Progress Notes (Signed)
 CRITICAL VALUE ALERT Critical value received:  WBC 70.8K, Platelets 15,000.  Date of notification:  11-11-2024 Time of notification: 11:15 am.  Critical value read back:  Yes.   Nurse who received alert:  B.Darlisha Kelm RN.  MD notified time and response:  J.Burns NP @ 11:25 am.  Patient has appointment with NP today.

## 2024-11-11 NOTE — Assessment & Plan Note (Addendum)
-   Denies any infections or B symptoms in the past 6 months. - Physical exam: No palpable adenopathy or splenomegaly. - Labs from 11/04/2024 showed a normal LDH, white blood cell count 75.7 which is increased from previous and less than 5000 platelets.  Hemoglobin is normal at 13.2. -Patient received 1 unit of platelets on 11/09/2024 with good tolerance.  He was also started on 40 mg dexamethasone  x 4 days.  He has not started this yet. -Discussed case with Dr. Davonna who would like to have him return to clinic in 1 week to repeat these labs and see if platelets have responded to steroids versus needing additional platelet transfusion and/or starting treatment for CLL. -Return to clinic in 1 week with labs and telephone call.

## 2024-11-11 NOTE — Progress Notes (Addendum)
 "  [facility] OFFICE PROGRESS NOTE  Shona Norleen PEDLAR, MD  ASSESSMENT & PLAN:    Assessment & Plan CLL (chronic lymphocytic leukemia) (HCC) - Denies any infections or B symptoms in the past 6 months. - Physical exam: No palpable adenopathy or splenomegaly. - Labs from 11/04/2024 showed a normal LDH, white blood cell count 75.7 which is increased from previous and less than 5000 platelets.  Hemoglobin is normal at 13.2. -Patient received 1 unit of platelets on 11/09/2024 with good tolerance.  He was also started on 40 mg dexamethasone  x 4 days.  He has not started this yet. -Discussed case with Dr. Davonna who would like to have him return to clinic in 1 week to repeat these labs and see if platelets have responded to steroids versus needing additional platelet transfusion and/or starting treatment for CLL. -Return to clinic in 1 week with labs and telephone call. 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  This was noted on review of chart after patient left.  - He follows up with Dr. Cam.  Orders Placed This Encounter  Procedures   Chronic Lymphocytic Leukemia (CLL) Profile, FISH    Standing Status:   Future    Expected Date:   11/17/2024    Expiration Date:   11/12/2025   CBC with Differential    Standing Status:   Future    Expected Date:   11/17/2024    Expiration Date:   02/15/2025   Comprehensive metabolic panel    Standing Status:   Future    Expected Date:   11/17/2024    Expiration Date:   02/15/2025    INTERVAL HISTORY: Patient returns for follow-up for CLL and renal cell carcinoma.  Patient reports he has been doing well since his last visit.  Appetite is 100% energy levels are 75%.  Denies any pain.  Has a slight cough.   On most recent lab draw, platelets found to be less than 5000.  He was given 1 unit of platelets on 11/09/2024.  He was also  prescribed dexamethasone  which she has not started because he wanted to talk with somebody.  He denies any bleeding, melena or hematochezia.  He continues to work full-time.  He lives at home with his wife.  We reviewed CBC, CMP and LDH.  SUMMARY OF HEMATOLOGIC HISTORY: Oncology History   No problem history exists.    -Diagnosis by peripheral blood flow cytometry on 03/07/2016 showing CD5 and CD23 positive B-cell population - Interestingly positive for CALR mutation, which can be positive in the ET and PMF.  Jak 2 and BCR/ABL negative CBC    Component Value Date/Time   WBC 70.8 (HH) 11/11/2024 1023   RBC 4.01 (L) 11/11/2024 1023   HGB 12.9 (L) 11/11/2024 1023   HCT 41.3 11/11/2024 1023   PLT 15 (LL) 11/11/2024 1023   MCV 103.0 (H) 11/11/2024 1023   MCH 32.2 11/11/2024 1023   MCHC 31.2 11/11/2024 1023   RDW 15.7 (H) 11/11/2024 1023   LYMPHSABS 57.5 (H) 11/09/2024 1432   MONOABS 11.7 (H) 11/09/2024 1432   EOSABS 0.1 11/09/2024 1432   BASOSABS 0.1 11/09/2024 1432       Latest Ref Rng & Units 11/04/2024   10:58 AM 06/17/2024    2:28 PM 12/19/2023    9:39 AM  CMP  Glucose 70 - 99 mg/dL 86  890  95   BUN 8 -  23 mg/dL 30  37  31   Creatinine 0.61 - 1.24 mg/dL 6.66  6.33  6.69   Sodium 135 - 145 mmol/L 143  140  141   Potassium 3.5 - 5.1 mmol/L 4.9  4.4  4.9   Chloride 98 - 111 mmol/L 108  107  108   CO2 22 - 32 mmol/L 20  21  24    Calcium  8.9 - 10.3 mg/dL 9.9  9.6  9.7   Total Protein 6.5 - 8.1 g/dL 6.7  6.7  7.3   Total Bilirubin 0.0 - 1.2 mg/dL 0.6  0.8  0.5   Alkaline Phos 38 - 126 U/L 99  90  85   AST 15 - 41 U/L 23  24  21    ALT 0 - 44 U/L 19  21  15       Lab Results  Component Value Date   FERRITIN 94 12/19/2023   VITAMINB12 793 12/19/2023    Vitals:   11/11/24 1139  BP: 111/74  Pulse: 74  Resp: 16  Temp: 97.6 F (36.4 C)  SpO2: 100%    Review of System:  Review of Systems  Constitutional:  Negative for malaise/fatigue and weight loss.   Gastrointestinal:  Negative for diarrhea.  Neurological:  Negative for dizziness.  Psychiatric/Behavioral:  The patient is not nervous/anxious.     Physical Exam: Physical Exam Constitutional:      Appearance: Normal appearance.  HENT:     Head: Normocephalic and atraumatic.  Eyes:     Pupils: Pupils are equal, round, and reactive to light.  Cardiovascular:     Rate and Rhythm: Normal rate and regular rhythm.     Heart sounds: Normal heart sounds. No murmur heard. Pulmonary:     Effort: Pulmonary effort is normal.     Breath sounds: Normal breath sounds. No wheezing.  Abdominal:     General: Bowel sounds are normal. There is no distension.     Palpations: Abdomen is soft.     Tenderness: There is no abdominal tenderness.  Musculoskeletal:        General: Normal range of motion.     Cervical back: Normal range of motion.  Skin:    General: Skin is warm and dry.     Findings: No rash.  Neurological:     Mental Status: He is alert and oriented to person, place, and time.     Gait: Gait is intact.  Psychiatric:        Mood and Affect: Mood and affect normal.        Cognition and Memory: Memory normal.        Judgment: Judgment normal.      I spent 20 minutes dedicated to the care of this patient (face-to-face and non-face-to-face) on the date of the encounter to include what is described in the assessment and plan.,  Delon Hope, NP 11/12/2024 8:06 AM "

## 2024-11-11 NOTE — Progress Notes (Signed)
 Patient saw J.Burns NP and Dr. Davonna today. Patient refused platelets. Vital signs stable. No complaints at this time. Discharged from clinic ambulatory in stable condition. Alert and oriented x 3. F/U with [facility] as scheduled.

## 2024-11-11 NOTE — Assessment & Plan Note (Addendum)
-  Underwent right nephrectomy on 02/18/2015, 2.2 cm papillary RCC, limited to kidney, margins negative.  This was noted on review of chart after patient left.  - He follows up with Dr. Cam.

## 2024-11-12 NOTE — Addendum Note (Signed)
 Addended by: GEOFM NEST E on: 11/12/2024 08:06 AM   Modules accepted: Orders

## 2024-11-12 NOTE — Addendum Note (Signed)
 Addended by: Breyon Sigg on: 11/12/2024 01:38 PM   Modules accepted: Level of Service

## 2024-11-14 LAB — BPAM PLATELET PHERESIS
Blood Product Expiration Date: 202601162359
Unit Type and Rh: 5100

## 2024-11-14 LAB — PREPARE PLATELET PHERESIS: Unit division: 0

## 2024-11-16 ENCOUNTER — Other Ambulatory Visit: Payer: Self-pay

## 2024-11-16 DIAGNOSIS — C61 Malignant neoplasm of prostate: Secondary | ICD-10-CM

## 2024-11-16 DIAGNOSIS — C649 Malignant neoplasm of unspecified kidney, except renal pelvis: Secondary | ICD-10-CM

## 2024-11-16 DIAGNOSIS — C9110 Chronic lymphocytic leukemia of B-cell type not having achieved remission: Secondary | ICD-10-CM

## 2024-11-17 ENCOUNTER — Inpatient Hospital Stay

## 2024-11-17 DIAGNOSIS — C61 Malignant neoplasm of prostate: Secondary | ICD-10-CM

## 2024-11-17 DIAGNOSIS — C9110 Chronic lymphocytic leukemia of B-cell type not having achieved remission: Secondary | ICD-10-CM

## 2024-11-17 DIAGNOSIS — C649 Malignant neoplasm of unspecified kidney, except renal pelvis: Secondary | ICD-10-CM

## 2024-11-17 LAB — CBC WITH DIFFERENTIAL/PLATELET
Abs Immature Granulocytes: 0.19 K/uL — ABNORMAL HIGH (ref 0.00–0.07)
Basophils Absolute: 0.1 K/uL (ref 0.0–0.1)
Basophils Relative: 0 %
Eosinophils Absolute: 0.2 K/uL (ref 0.0–0.5)
Eosinophils Relative: 0 %
HCT: 44.9 % (ref 39.0–52.0)
Hemoglobin: 14 g/dL (ref 13.0–17.0)
Immature Granulocytes: 0 %
Lymphocytes Relative: 83 %
Lymphs Abs: 85.8 K/uL — ABNORMAL HIGH (ref 0.7–4.0)
MCH: 32.1 pg (ref 26.0–34.0)
MCHC: 31.2 g/dL (ref 30.0–36.0)
MCV: 103 fL — ABNORMAL HIGH (ref 80.0–100.0)
Monocytes Absolute: 12.8 K/uL — ABNORMAL HIGH (ref 0.1–1.0)
Monocytes Relative: 12 %
Neutro Abs: 5.2 K/uL (ref 1.7–7.7)
Neutrophils Relative %: 5 %
Platelets: 96 K/uL — ABNORMAL LOW (ref 150–400)
RBC: 4.36 MIL/uL (ref 4.22–5.81)
RDW: 15.9 % — ABNORMAL HIGH (ref 11.5–15.5)
WBC: 104.2 K/uL (ref 4.0–10.5)
nRBC: 0 % (ref 0.0–0.2)

## 2024-11-17 LAB — COMPREHENSIVE METABOLIC PANEL WITH GFR
ALT: 41 U/L (ref 0–44)
AST: 25 U/L (ref 15–41)
Albumin: 4 g/dL (ref 3.5–5.0)
Alkaline Phosphatase: 88 U/L (ref 38–126)
Anion gap: 14 (ref 5–15)
BUN: 54 mg/dL — ABNORMAL HIGH (ref 8–23)
CO2: 20 mmol/L — ABNORMAL LOW (ref 22–32)
Calcium: 9.1 mg/dL (ref 8.9–10.3)
Chloride: 109 mmol/L (ref 98–111)
Creatinine, Ser: 3.36 mg/dL — ABNORMAL HIGH (ref 0.61–1.24)
GFR, Estimated: 18 mL/min — ABNORMAL LOW
Glucose, Bld: 116 mg/dL — ABNORMAL HIGH (ref 70–99)
Potassium: 4.3 mmol/L (ref 3.5–5.1)
Sodium: 143 mmol/L (ref 135–145)
Total Bilirubin: 0.7 mg/dL (ref 0.0–1.2)
Total Protein: 6 g/dL — ABNORMAL LOW (ref 6.5–8.1)

## 2024-11-17 LAB — SAMPLE TO BLOOD BANK

## 2024-11-17 NOTE — Progress Notes (Signed)
 CRITICAL VALUE ALERT Critical value received:  WBC 104.2 Date of notification:  11-17-24 Time of notification: 1045 Critical value read back:  Yes.   Nurse who received alert:  C. Adela Esteban RN MD notified time and response:  1115 Delon Hope NP, no new orders

## 2024-11-18 ENCOUNTER — Inpatient Hospital Stay: Admitting: Oncology

## 2024-11-18 DIAGNOSIS — C9110 Chronic lymphocytic leukemia of B-cell type not having achieved remission: Secondary | ICD-10-CM | POA: Diagnosis not present

## 2024-11-18 DIAGNOSIS — D696 Thrombocytopenia, unspecified: Secondary | ICD-10-CM

## 2024-11-18 NOTE — Assessment & Plan Note (Addendum)
-   Denies any infections or B symptoms in the past 6 months. - Physical exam: No palpable adenopathy or splenomegaly. - Labs from 11/04/2024 showed a normal LDH, white blood cell count 75.7 which is increased from previous and platelets less than 5000 platelets.  Hemoglobin is normal at 13.2. -Patient received 1 unit of platelets on 11/09/2024 with good tolerance.  He was also started on 40 mg dexamethasone  x 4 days.   -Most recent labs from 11/17/2024 show improvement of his platelet count to 96,000 and worsening leukocytosis likely multifactorial from recent steroids and worsening CLL. -Discussed case with Dr. Davonna who recommends weekly lab draw and see her back in 1 month.

## 2024-11-18 NOTE — Assessment & Plan Note (Addendum)
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 -Platelets improved after 40 mg dexamethasone  x 4 days. -Current platelet count is 96,000 previously 15,000. -Recommend weekly labs and see Dr.Kandala back in 1 month.

## 2024-11-18 NOTE — Progress Notes (Signed)
 "  [facility] OFFICE PROGRESS NOTE  Shona Norleen PEDLAR, MD  ASSESSMENT & PLAN:   I connected with Keith Rivera on 11/18/24 at 11:15 AM EST by telephone visit and verified that I am speaking with the correct person using two identifiers.   I discussed the limitations, risks, security and privacy concerns of performing an evaluation and management service by telemedicine and the availability of in-person appointments. I also discussed with the patient that there may be a patient responsible charge related to this service. The patient expressed understanding and agreed to proceed.   Other persons participating in the visit and their role in the encounter: NP, Patient    Patients location: Home  Providers location: Clinic   Assessment & Plan CLL (chronic lymphocytic leukemia) (HCC) - Denies any infections or B symptoms in the past 6 months. - Physical exam: No palpable adenopathy or splenomegaly. - Labs from 11/04/2024 showed a normal LDH, white blood cell count 75.7 which is increased from previous and platelets less than 5000 platelets.  Hemoglobin is normal at 13.2. -Patient received 1 unit of platelets on 11/09/2024 with good tolerance.  He was also started on 40 mg dexamethasone  x 4 days.   -Most recent labs from 11/17/2024 show improvement of his platelet count to 96,000 and worsening leukocytosis likely multifactorial from recent steroids and worsening CLL. -Discussed case with Dr. Davonna who recommends weekly lab draw and see her back in 1 month. 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 -Platelets improved after 40 mg dexamethasone  x 4 days. -Current platelet count is 96,000 previously 15,000. -Recommend weekly labs and see Dr.Kandala back in 1 month.  Orders Placed This Encounter  Procedures   CBC with Differential    Standing Status:   Standing    Number of Occurrences:   4    Expiration Date:   11/18/2025    INTERVAL HISTORY: Patient returns for  follow-up for CLL and renal cell carcinoma.  Over the past couple of years, patient's white count has started to trend up.  Hemoglobin and platelet counts remained normal.  Most recent lab draw showed a decline in his platelet counts less than 5000.  He was given a unit of platelets and asked to start on dexamethasone  40 mg daily x 4 days.  He is here today to review his most recent lab draws.  Overall, patient says he is feeling well.  Appetite is 100% energy levels are 75% denies any bleeding, melena or hematochezia.  He has dizziness secondary to vertigo and cough that is improving.  He continues to work full-time.  He lives at home with his wife.  We reviewed CBC, CMP and LDH.  SUMMARY OF HEMATOLOGIC HISTORY: Oncology History   No problem history exists.    -Diagnosis by peripheral blood flow cytometry on 03/07/2016 showing CD5 and CD23 positive B-cell population - Interestingly positive for CALR mutation, which can be positive in the ET and PMF.  Jak 2 and BCR/ABL negative CBC    Component Value Date/Time   WBC 104.2 (HH) 11/17/2024 0945   RBC 4.36 11/17/2024 0945   HGB 14.0 11/17/2024 0945   HCT 44.9 11/17/2024 0945   PLT 96 (L) 11/17/2024 0945   MCV 103.0 (H) 11/17/2024 0945   MCH 32.1 11/17/2024 0945   MCHC 31.2 11/17/2024 0945   RDW 15.9 (H) 11/17/2024 0945   LYMPHSABS 85.8 (H) 11/17/2024 0945   MONOABS 12.8 (H) 11/17/2024 0945   EOSABS 0.2 11/17/2024 0945  BASOSABS 0.1 11/17/2024 0945       Latest Ref Rng & Units 11/17/2024    9:45 AM 11/04/2024   10:58 AM 06/17/2024    2:28 PM  CMP  Glucose 70 - 99 mg/dL 883  86  890   BUN 8 - 23 mg/dL 54  30  37   Creatinine 0.61 - 1.24 mg/dL 6.63  6.66  6.33   Sodium 135 - 145 mmol/L 143  143  140   Potassium 3.5 - 5.1 mmol/L 4.3  4.9  4.4   Chloride 98 - 111 mmol/L 109  108  107   CO2 22 - 32 mmol/L 20  20  21    Calcium  8.9 - 10.3 mg/dL 9.1  9.9  9.6   Total Protein 6.5 - 8.1 g/dL 6.0  6.7  6.7   Total Bilirubin 0.0 - 1.2  mg/dL 0.7  0.6  0.8   Alkaline Phos 38 - 126 U/L 88  99  90   AST 15 - 41 U/L 25  23  24    ALT 0 - 44 U/L 41  19  21      Lab Results  Component Value Date   FERRITIN 94 12/19/2023   VITAMINB12 793 12/19/2023    There were no vitals filed for this visit.   Review of System:  Review of Systems  Constitutional:  Negative for malaise/fatigue and weight loss.  Respiratory:  Positive for cough.   Gastrointestinal:  Negative for diarrhea.  Neurological:  Positive for dizziness.  Psychiatric/Behavioral:  The patient is not nervous/anxious.     Physical Exam: Physical Exam Neurological:     Mental Status: He is alert and oriented to person, place, and time.     I provided 18 minutes of non face-to-face telephone visit time during this encounter, and > 50% was spent counseling as documented under my assessment & plan.   Keith Hope, NP 11/18/2024 3:14 PM "

## 2024-11-23 LAB — FISH HES LEUKEMIA, 4Q12 REA

## 2024-11-25 ENCOUNTER — Inpatient Hospital Stay

## 2024-12-01 ENCOUNTER — Encounter: Payer: Self-pay | Admitting: *Deleted

## 2024-12-02 ENCOUNTER — Inpatient Hospital Stay

## 2024-12-02 ENCOUNTER — Inpatient Hospital Stay: Attending: Oncology

## 2024-12-02 DIAGNOSIS — C9110 Chronic lymphocytic leukemia of B-cell type not having achieved remission: Secondary | ICD-10-CM

## 2024-12-02 LAB — CBC WITH DIFFERENTIAL/PLATELET
Abs Immature Granulocytes: 0.7 10*3/uL — ABNORMAL HIGH (ref 0.00–0.07)
Band Neutrophils: 2 %
Basophils Absolute: 0.7 10*3/uL — ABNORMAL HIGH (ref 0.0–0.1)
Basophils Relative: 1 %
Blasts: 2 %
Eosinophils Absolute: 0 10*3/uL (ref 0.0–0.5)
Eosinophils Relative: 0 %
HCT: 42.6 % (ref 39.0–52.0)
Hemoglobin: 13.2 g/dL (ref 13.0–17.0)
Lymphocytes Relative: 83 %
Lymphs Abs: 57.9 10*3/uL — ABNORMAL HIGH (ref 0.7–4.0)
MCH: 32.4 pg (ref 26.0–34.0)
MCHC: 31 g/dL (ref 30.0–36.0)
MCV: 104.4 fL — ABNORMAL HIGH (ref 80.0–100.0)
Metamyelocytes Relative: 1 %
Monocytes Absolute: 3.5 10*3/uL — ABNORMAL HIGH (ref 0.1–1.0)
Monocytes Relative: 5 %
Neutro Abs: 5.6 10*3/uL (ref 1.7–7.7)
Neutrophils Relative %: 6 %
Platelets: 19 10*3/uL — CL (ref 150–400)
RBC: 4.08 MIL/uL — ABNORMAL LOW (ref 4.22–5.81)
RDW: 15.5 % (ref 11.5–15.5)
WBC: 69.7 10*3/uL (ref 4.0–10.5)
nRBC: 0 % (ref 0.0–0.2)

## 2024-12-02 NOTE — Progress Notes (Signed)
 CRITICAL VALUE ALERT Critical value received:  WBC 69.7, platelets 19,000 Date of notification:  12-02-24 Time of notification: 1500 Critical value read back:  Yes.   Nurse who received alert:  C. Omari Koslosky RN MD notified time and response:  1505, Dr. Davonna, no need for platelet transfusion

## 2024-12-03 ENCOUNTER — Ambulatory Visit: Payer: Self-pay | Admitting: Oncology

## 2024-12-03 NOTE — Progress Notes (Signed)
 Hey his platelet counts fell again he sees you on 12/08/2024.  He received a unit of platelets.   Delon Hope, AGNP-C Department of Hematology/Oncology [facility] at [facility]  Phone: [phone]  12/03/2024 3:00 PM

## 2024-12-08 ENCOUNTER — Inpatient Hospital Stay

## 2024-12-08 ENCOUNTER — Inpatient Hospital Stay: Admitting: Oncology

## 2024-12-09 ENCOUNTER — Inpatient Hospital Stay

## 2024-12-16 ENCOUNTER — Inpatient Hospital Stay: Admitting: Oncology

## 2024-12-16 ENCOUNTER — Inpatient Hospital Stay
